# Patient Record
Sex: Male | Born: 1935 | Race: Black or African American | Hispanic: No | Marital: Married | Smoking: Former smoker
Health system: Southern US, Community
[De-identification: ages and names within clinical notes are randomized; demographics above are authoritative.]

## PROBLEM LIST (undated history)

## (undated) DIAGNOSIS — M1990 Unspecified osteoarthritis, unspecified site: Secondary | ICD-10-CM

## (undated) DIAGNOSIS — N400 Enlarged prostate without lower urinary tract symptoms: Secondary | ICD-10-CM

## (undated) DIAGNOSIS — E785 Hyperlipidemia, unspecified: Secondary | ICD-10-CM

## (undated) DIAGNOSIS — H547 Unspecified visual loss: Secondary | ICD-10-CM

## (undated) DIAGNOSIS — I10 Essential (primary) hypertension: Secondary | ICD-10-CM

## (undated) DIAGNOSIS — H409 Unspecified glaucoma: Secondary | ICD-10-CM

## (undated) DIAGNOSIS — E1139 Type 2 diabetes mellitus with other diabetic ophthalmic complication: Secondary | ICD-10-CM

## (undated) DIAGNOSIS — K219 Gastro-esophageal reflux disease without esophagitis: Secondary | ICD-10-CM

## (undated) HISTORY — DX: Hyperlipidemia, unspecified: E78.5

## (undated) HISTORY — DX: Essential (primary) hypertension: I10

## (undated) HISTORY — DX: Gastro-esophageal reflux disease without esophagitis: K21.9

## (undated) HISTORY — DX: Type 2 diabetes mellitus with other diabetic ophthalmic complication: H54.7

## (undated) HISTORY — DX: Benign prostatic hyperplasia without lower urinary tract symptoms: N40.0

## (undated) HISTORY — DX: Unspecified osteoarthritis, unspecified site: M19.90

## (undated) HISTORY — DX: Unspecified glaucoma: H40.9

## (undated) HISTORY — PX: HERNIA REPAIR: SHX51

## (undated) HISTORY — DX: Type 2 diabetes mellitus with other diabetic ophthalmic complication: E11.39

---

## 2017-07-11 ENCOUNTER — Ambulatory Visit (INDEPENDENT_AMBULATORY_CARE_PROVIDER_SITE_OTHER): Payer: Medicare HMO | Admitting: Family Medicine

## 2017-07-11 ENCOUNTER — Encounter: Payer: Self-pay | Admitting: Family Medicine

## 2017-07-11 VITALS — BP 120/70 | HR 108 | Temp 97.7°F | Wt 214.3 lb

## 2017-07-11 DIAGNOSIS — E119 Type 2 diabetes mellitus without complications: Secondary | ICD-10-CM

## 2017-07-11 DIAGNOSIS — I10 Essential (primary) hypertension: Secondary | ICD-10-CM

## 2017-07-11 DIAGNOSIS — H6123 Impacted cerumen, bilateral: Secondary | ICD-10-CM | POA: Diagnosis not present

## 2017-07-11 DIAGNOSIS — Z7689 Persons encountering health services in other specified circumstances: Secondary | ICD-10-CM

## 2017-07-11 DIAGNOSIS — H409 Unspecified glaucoma: Secondary | ICD-10-CM

## 2017-07-11 LAB — POCT GLYCOSYLATED HEMOGLOBIN (HGB A1C): Hemoglobin A1C: 6.5

## 2017-07-11 LAB — GLUCOSE, POCT (MANUAL RESULT ENTRY): POC Glucose: 164 mg/dl — AB (ref 70–99)

## 2017-07-11 NOTE — Progress Notes (Signed)
Patient presents to clinic today to establish care.  SUBJECTIVE: PMH: Pt is a 82 yo male with pmh sig for legally blind, GERD, HLD, HTN, diabetes, BPH, arthritis.  Pt was previously seen in [place], [place].  GERD: -taking pantoprazole prn  DM II: -dx'd in 2002 -taking Metformin  -does not check FSBS at home.  H/o glaucoma: -pt states he does use any drops -Patient states he recently had his vision checked prior to leaving [place] -Patient states he does not due for another ophthalmology appointment until ~6 months  BPH: -Flomax 0.4 mg  Allergies: NKDA  Past surgical history: Hernia repair  Social history: Patient is married.  He is retired.  Patient has 2 children.  Patient was living in [place] with 1 but moved to the [place] area to live with his daughter.  Patient denies tobacco, alcohol, drug use.  Patient is a former smoker.  He quit 40 years ago.   Past Medical History:  Diagnosis Date  . Arthritis   . Blindness due to type 2 diabetes mellitus (HCC)   . Glaucoma     Past Surgical History:  Procedure Laterality Date  . HERNIA REPAIR      Current Outpatient Medications on File Prior to Visit  Medication Sig Dispense Refill  . acetaminophen (TYLENOL) 500 MG tablet Take 500 mg by mouth every 8 (eight) hours as needed.    . Ascorbic Acid (VITAMIN C WITH ROSE HIPS) 500 MG tablet Take 500 mg by mouth daily.    Marland Kitchen. aspirin EC 81 MG tablet Take 81 mg by mouth daily.    Marland Kitchen. atorvastatin (LIPITOR) 40 MG tablet Take 40 mg by mouth daily.    . metFORMIN (GLUCOPHAGE) 500 MG tablet Take 500 mg by mouth daily.    . metoprolol tartrate (LOPRESSOR) 25 MG tablet Take 25 mg by mouth 2 (two) times daily.    . Multiple Vitamins-Minerals (CENTRUM SILVER ADULT 50+ PO) Take 1 tablet by mouth daily.    . pantoprazole (PROTONIX) 40 MG tablet Take 40 mg by mouth daily.    . tamsulosin (FLOMAX) 0.4 MG CAPS capsule Take 0.4 mg by mouth.     No current facility-administered  medications on file prior to visit.     Not on File  History reviewed. No pertinent family history.  Social History   Socioeconomic History  . Marital status: Unknown    Spouse name: Not on file  . Number of children: Not on file  . Years of education: Not on file  . Highest education level: Not on file  Occupational History  . Not on file  Social Needs  . Financial resource strain: Not on file  . Food insecurity:    Worry: Not on file    Inability: Not on file  . Transportation needs:    Medical: Not on file    Non-medical: Not on file  Tobacco Use  . Smoking status: Not on file  Substance and Sexual Activity  . Alcohol use: Not on file  . Drug use: Not on file  . Sexual activity: Not on file  Lifestyle  . Physical activity:    Days per week: Not on file    Minutes per session: Not on file  . Stress: Not on file  Relationships  . Social connections:    Talks on phone: Not on file    Gets together: Not on file    Attends religious service: Not on file    Active member  of club or organization: Not on file    Attends meetings of clubs or organizations: Not on file    Relationship status: Not on file  . Intimate partner violence:    Fear of current or ex partner: Not on file    Emotionally abused: Not on file    Physically abused: Not on file    Forced sexual activity: Not on file  Other Topics Concern  . Not on file  Social History Narrative  . Not on file    ROS General: Denies fever, chills, night sweats, changes in weight, changes in appetite HEENT: Denies headaches, ear pain, changes in vision, rhinorrhea, sore throat  + legally blind CV: Denies CP, palpitations, SOB, orthopnea Pulm: Denies SOB, cough, wheezing GI: Denies abdominal pain, nausea, vomiting, diarrhea, constipation GU: Denies dysuria, hematuria, frequency, vaginal discharge Msk: Denies muscle cramps, joint pains Neuro: Denies weakness, numbness, tingling Skin: Denies rashes,  bruising Psych: Denies depression, anxiety, hallucinations  BP 120/70 (BP Location: Left Arm, Patient Position: Sitting, Cuff Size: Normal)   Pulse (!) 108   Temp 97.7 F (36.5 C) (Oral)   Wt 214 lb 4.8 oz (97.2 kg)   SpO2 97%   Physical Exam Gen. Pleasant, well developed, well-nourished, in NAD HEENT -legally blind. [place]/AT, PERRL, no scleral icterus, no nasal drainage, pharynx without erythema or exudate.  TMs normal bilaterally after impacted cerumen removed with irrigation. Lungs: no use of accessory muscles, CTAB, no wheezes, rales or rhonchi Cardiovascular: RRR,  No r/g/m, no peripheral edema Abdomen: BS present, soft, nontender, nondistended Neuro:  A&Ox3, CN II-XII intact, uses rollator to assist with ambulation Skin:  Warm, dry, intact, no lesions Psych: normal affect, mood appropriate  No results found for this or any previous visit (from the past 2160 hour(s)).  Assessment/Plan: Essential hypertension -Controlled -Continue lisinopril 20 mg and metoprolol 25 mg twice daily  Diabetes mellitus without complication (HCC) -Hemoglobin A1c 6.5% this visit -FSBS 164 -Continue metformin 500 mg daily - Plan: Ambulatory referral to Podiatry, Ambulatory referral to Ophthalmology, POCT glycosylated hemoglobin (Hb A1C), POCT glucose (manual entry)  Glaucoma of both eyes, unspecified glaucoma type - Plan: Ambulatory referral to Ophthalmology  Bilateral impacted cerumen -Consent obtained.  Bilateral ears irrigated.  Patient tolerated procedure well.  Encounter to establish care -We reviewed the PMH, PSH, FH, SH, Meds and Allergies. -We provided refills for any medications we will prescribe as needed. -We addressed current concerns per orders and patient instructions. -We have asked for records for pertinent exams, studies, vaccines and notes from previous providers. -We have advised patient to follow up per instructions below.   Follow-up in the next 2 to 3 months.  Abbe Amsterdam, MD

## 2017-07-11 NOTE — Patient Instructions (Signed)
Earwax Buildup, Adult The ears produce a substance called earwax that helps keep bacteria out of the ear and protects the skin in the ear canal. Occasionally, earwax can build up in the ear and cause discomfort or hearing loss. What increases the risk? This condition is more likely to develop in people who:  Are male.  Are elderly.  Naturally produce more earwax.  Clean their ears often with cotton swabs.  Use earplugs often.  Use in-ear headphones often.  Wear hearing aids.  Have narrow ear canals.  Have earwax that is overly thick or sticky.  Have eczema.  Are dehydrated.  Have excess hair in the ear canal.  What are the signs or symptoms? Symptoms of this condition include:  Reduced or muffled hearing.  A feeling of fullness in the ear or feeling that the ear is plugged.  Fluid coming from the ear.  Ear pain.  Ear itch.  Ringing in the ear.  Coughing.  An obvious piece of earwax that can be seen inside the ear canal.  How is this diagnosed? This condition may be diagnosed based on:  Your symptoms.  Your medical history.  An ear exam. During the exam, your health care provider will look into your ear with an instrument called an otoscope.  You may have tests, including a hearing test. How is this treated? This condition may be treated by:  Using ear drops to soften the earwax.  Having the earwax removed by a health care provider. The health care provider may: ? Flush the ear with water. ? Use an instrument that has a loop on the end (curette). ? Use a suction device.  Surgery to remove the wax buildup. This may be done in severe cases.  Follow these instructions at home:  Take over-the-counter and prescription medicines only as told by your health care provider.  Do not put any objects, including cotton swabs, into your ear. You can clean the opening of your ear canal with a washcloth or facial tissue.  Follow instructions from your health  care provider about cleaning your ears. Do not over-clean your ears.  Drink enough fluid to keep your urine clear or pale yellow. This will help to thin the earwax.  Keep all follow-up visits as told by your health care provider. If earwax builds up in your ears often or if you use hearing aids, consider seeing your health care provider for routine, preventive ear cleanings. Ask your health care provider how often you should schedule your cleanings.  If you have hearing aids, clean them according to instructions from the manufacturer and your health care provider. Contact a health care provider if:  You have ear pain.  You develop a fever.  You have blood, pus, or other fluid coming from your ear.  You have hearing loss.  You have ringing in your ears that does not go away.  Your symptoms do not improve with treatment.  You feel like the room is spinning (vertigo). Summary  Earwax can build up in the ear and cause discomfort or hearing loss.  The most common symptoms of this condition include reduced or muffled hearing and a feeling of fullness in the ear or feeling that the ear is plugged.  This condition may be diagnosed based on your symptoms, your medical history, and an ear exam.  This condition may be treated by using ear drops to soften the earwax or by having the earwax removed by a health care provider.  Do   not put any objects, including cotton swabs, into your ear. You can clean the opening of your ear canal with a washcloth or facial tissue. This information is not intended to replace advice given to you by your health care provider. Make sure you discuss any questions you have with your health care provider. Document Released: 04/12/2004 Document Revised: 05/16/2016 Document Reviewed: 05/16/2016 Elsevier Interactive Patient Education  2018 [employer]  

## 2017-07-16 ENCOUNTER — Telehealth: Payer: Self-pay | Admitting: Family Medicine

## 2017-07-16 ENCOUNTER — Other Ambulatory Visit: Payer: Self-pay | Admitting: Family Medicine

## 2017-07-16 MED ORDER — TAMSULOSIN HCL 0.4 MG PO CAPS: 0.4000 mg | ORAL_CAPSULE | Freq: Two times a day (BID) | ORAL | 3 refills | Status: DC

## 2017-07-16 NOTE — Telephone Encounter (Signed)
Pt is requesting a refill on medication: - Lisinopril   This medication is not listed in pts chart. Please advise.

## 2017-07-16 NOTE — Telephone Encounter (Signed)
Please call pt's or his wife as pt is leagally blind if I remember correctly, to verify the dose of lisinopril and the flomax.  I do recall pt being on flomax, but not twice a day.  I am ok with refilling these meds after we get clarification.

## 2017-07-16 NOTE — Telephone Encounter (Signed)
Copied from CRM [id number]. Topic: Quick Communication - Rx Refill/Question >> Jul 16, 2017 11:10 AM Maia Petties wrote: Medication: tamsulosin 2/day, pt is out - lisinopril 1/day, pt has 2 left Has the patient contacted their pharmacy? No - new pt with Dr. Salomon Fick. Preferred Pharmacy (with phone number or street name): [id number] [place], [place] - [address] [phone] (Phone) [phone] (Fax)

## 2017-07-18 NOTE — Telephone Encounter (Signed)
Flomax was filled to pharmacy on 07/16/2017.  Called patient's daughter (on [place]) to verify dosing. She is at work and asked that I call patient/his wife at home.  Called patient and left message to return call.

## 2017-07-19 NOTE — Telephone Encounter (Signed)
Spoke w/ patient. He no longer has the bottle for his lisinopril. It was last filled by [facility] in [place].  Attempted to call them to verify dose at [phone], but there was no answer.  Will attempt call back.

## 2017-07-24 NOTE — Telephone Encounter (Signed)
Copied from CRM [id number]. Topic: Quick Communication - See Telephone Encounter >> Jul 24, 2017 11:10 AM Jolayne Haines L wrote: Patient called back and stated that the dosage is , one tablet by mouth daily.

## 2017-07-25 ENCOUNTER — Other Ambulatory Visit: Payer: Self-pay

## 2017-07-25 ENCOUNTER — Telehealth: Payer: Self-pay

## 2017-07-25 MED ORDER — LISINOPRIL 20 MG PO TABS: 20.0000 mg | ORAL_TABLET | Freq: Every day | ORAL | 1 refills | Status: DC

## 2017-07-25 NOTE — Telephone Encounter (Signed)
Rx for Lisinopril 20 mg was sent to pt pharmacy [facility]

## 2017-07-26 ENCOUNTER — Encounter (INDEPENDENT_AMBULATORY_CARE_PROVIDER_SITE_OTHER): Payer: Self-pay | Admitting: Ophthalmology

## 2017-07-31 ENCOUNTER — Encounter (INDEPENDENT_AMBULATORY_CARE_PROVIDER_SITE_OTHER): Payer: Self-pay | Admitting: Ophthalmology

## 2017-08-07 ENCOUNTER — Telehealth: Payer: Self-pay | Admitting: Family Medicine

## 2017-08-07 NOTE — Telephone Encounter (Signed)
Ok to refill 

## 2017-08-07 NOTE — Telephone Encounter (Signed)
Refill for Lipitor 40 mg prescribed by historical provider  Dr. Kathie Rhodes. [place] LOV  07/14/17 NOV  09/13/17  [facility] [address] [place]

## 2017-08-07 NOTE — Telephone Encounter (Unsigned)
Copied from CRM [id number]. Topic: Quick Communication - Rx Refill/Question >> Aug 07, 2017 12:16 PM Floria Raveling A wrote: Medication: atorvastatin (LIPITOR) 40 MG tablet [[id number]]   Has the patient contacted their pharmacy? No  (Agent: If no, request that the patient contact the pharmacy for the refill.) (Agent: If yes, when and what did the pharmacy advise?)  Preferred Pharm[facility] (with phone number [facility] [address]   Agent: Please be advised that RX refills may take up to 3 business days. We ask that you follow-up with your pharmacy.

## 2017-08-08 MED ORDER — ATORVASTATIN CALCIUM 40 MG PO TABS: 40.0000 mg | ORAL_TABLET | Freq: Every day | ORAL | 0 refills | Status: DC

## 2017-08-08 NOTE — Telephone Encounter (Signed)
I sent script e-scribe for a 90 day supply [facility] [address].

## 2017-08-09 ENCOUNTER — Other Ambulatory Visit: Payer: Self-pay | Admitting: Family Medicine

## 2017-08-09 MED ORDER — ASPIRIN EC 81 MG PO TBEC: 81.0000 mg | DELAYED_RELEASE_TABLET | Freq: Every day | ORAL | 3 refills | Status: DC

## 2017-08-09 NOTE — Telephone Encounter (Signed)
ASA is OTC.

## 2017-08-09 NOTE — Telephone Encounter (Signed)
Copied from CRM [id number]. Topic: Quick Communication - See Telephone Encounter >> Aug 09, 2017  9:53 AM Waymon Amato wrote: Pt is needing a refill on  his aspirin   [facility] [address] [phone]  Best number [phone]

## 2017-08-09 NOTE — Telephone Encounter (Signed)
Pt. requesting refill on ASA 81 mg ; historical provider only on this medication. Last office visit with Dr. Salomon Fick 07/11/17 to establish care [facility] [address]

## 2017-08-09 NOTE — Telephone Encounter (Signed)
Please advise 

## 2017-09-13 ENCOUNTER — Encounter (INDEPENDENT_AMBULATORY_CARE_PROVIDER_SITE_OTHER): Payer: Self-pay | Admitting: Ophthalmology

## 2017-09-13 ENCOUNTER — Ambulatory Visit: Payer: Medicare HMO | Admitting: Podiatry

## 2017-09-13 ENCOUNTER — Ambulatory Visit: Payer: Medicare HMO | Admitting: Family Medicine

## 2017-10-02 DIAGNOSIS — H409 Unspecified glaucoma: Secondary | ICD-10-CM | POA: Diagnosis not present

## 2017-10-02 DIAGNOSIS — I10 Essential (primary) hypertension: Secondary | ICD-10-CM | POA: Diagnosis not present

## 2017-10-02 DIAGNOSIS — M170 Bilateral primary osteoarthritis of knee: Secondary | ICD-10-CM | POA: Diagnosis not present

## 2017-10-02 DIAGNOSIS — E1140 Type 2 diabetes mellitus with diabetic neuropathy, unspecified: Secondary | ICD-10-CM | POA: Diagnosis not present

## 2017-10-02 DIAGNOSIS — E782 Mixed hyperlipidemia: Secondary | ICD-10-CM | POA: Diagnosis not present

## 2017-10-02 DIAGNOSIS — Z7984 Long term (current) use of oral hypoglycemic drugs: Secondary | ICD-10-CM | POA: Diagnosis not present

## 2017-10-02 DIAGNOSIS — K219 Gastro-esophageal reflux disease without esophagitis: Secondary | ICD-10-CM | POA: Diagnosis not present

## 2017-10-02 DIAGNOSIS — N400 Enlarged prostate without lower urinary tract symptoms: Secondary | ICD-10-CM | POA: Diagnosis not present

## 2017-10-15 ENCOUNTER — Other Ambulatory Visit: Payer: Self-pay | Admitting: Family Medicine

## 2017-10-15 DIAGNOSIS — E113512 Type 2 diabetes mellitus with proliferative diabetic retinopathy with macular edema, left eye: Secondary | ICD-10-CM | POA: Diagnosis not present

## 2017-10-15 DIAGNOSIS — H401123 Primary open-angle glaucoma, left eye, severe stage: Secondary | ICD-10-CM | POA: Diagnosis not present

## 2017-10-15 DIAGNOSIS — H04123 Dry eye syndrome of bilateral lacrimal glands: Secondary | ICD-10-CM | POA: Diagnosis not present

## 2017-10-15 DIAGNOSIS — H548 Legal blindness, as defined in USA: Secondary | ICD-10-CM | POA: Diagnosis not present

## 2017-10-22 DIAGNOSIS — H401123 Primary open-angle glaucoma, left eye, severe stage: Secondary | ICD-10-CM | POA: Diagnosis not present

## 2017-10-22 DIAGNOSIS — H348322 Tributary (branch) retinal vein occlusion, left eye, stable: Secondary | ICD-10-CM | POA: Diagnosis not present

## 2017-10-22 DIAGNOSIS — H348312 Tributary (branch) retinal vein occlusion, right eye, stable: Secondary | ICD-10-CM | POA: Diagnosis not present

## 2017-10-22 DIAGNOSIS — H401133 Primary open-angle glaucoma, bilateral, severe stage: Secondary | ICD-10-CM | POA: Diagnosis not present

## 2017-10-28 DIAGNOSIS — H409 Unspecified glaucoma: Secondary | ICD-10-CM | POA: Diagnosis not present

## 2017-10-28 DIAGNOSIS — Z01818 Encounter for other preprocedural examination: Secondary | ICD-10-CM | POA: Diagnosis not present

## 2017-10-28 DIAGNOSIS — H401113 Primary open-angle glaucoma, right eye, severe stage: Secondary | ICD-10-CM | POA: Diagnosis not present

## 2018-01-09 ENCOUNTER — Other Ambulatory Visit: Payer: Self-pay

## 2018-01-09 ENCOUNTER — Encounter: Payer: Self-pay | Admitting: Podiatry

## 2018-01-09 ENCOUNTER — Ambulatory Visit: Payer: Medicare HMO | Admitting: Podiatry

## 2018-01-09 VITALS — BP 139/66

## 2018-01-09 DIAGNOSIS — M79674 Pain in right toe(s): Secondary | ICD-10-CM

## 2018-01-09 DIAGNOSIS — B351 Tinea unguium: Secondary | ICD-10-CM

## 2018-01-09 DIAGNOSIS — L600 Ingrowing nail: Secondary | ICD-10-CM

## 2018-01-09 DIAGNOSIS — M79675 Pain in left toe(s): Secondary | ICD-10-CM | POA: Diagnosis not present

## 2018-01-09 DIAGNOSIS — E1142 Type 2 diabetes mellitus with diabetic polyneuropathy: Secondary | ICD-10-CM | POA: Diagnosis not present

## 2018-01-09 NOTE — Patient Instructions (Addendum)
APPLY TRIPLE ANTIBIOTIC OINTMENT TO LEFT GREAT TOE ONCE DAILY FOR  ONE WEEK      Diabetic Neuropathy Diabetic neuropathy is a nerve disease or nerve damage that is caused by diabetes mellitus. About half of all people with diabetes mellitus have some form of nerve damage. Nerve damage is more common in those who have had diabetes mellitus for many years and who generally have not had good control of their blood sugar (glucose) level. Diabetic neuropathy is a common complication of diabetes mellitus. There are three common types of diabetic neuropathy and a fourth type that is less common and less understood:  Peripheral neuropathy-This is the most common type of diabetic neuropathy. It causes damage to the nerves of the feet and legs first and then eventually the hands and arms. The damage affects the ability to sense touch.  Autonomic neuropathy-This type causes damage to the autonomic nervous system, which controls the following functions: ? Heartbeat. ? Body temperature. ? Blood pressure. ? Urination. ? Digestion. ? Sweating. ? Sexual function.  Focal neuropathy-Focal neuropathy can be painful and unpredictable and occurs most often in older adults with diabetes mellitus. It involves a specific nerve or one area and often comes on suddenly. It usually does not cause long-term problems.  Radiculoplexus neuropathy- Sometimes called lumbosacral radiculoplexus neuropathy, radiculoplexus neuropathy affects the nerves of the thighs, hips, buttocks, or legs. It is more common in people with type 2 diabetes mellitus and in older men. It is characterized by debilitating pain, weakness, and atrophy, usually in the thigh muscles.  What are the causes? The cause of peripheral, autonomic, and focal neuropathies is diabetes mellitus that is uncontrolled and high glucose levels. The cause of radiculoplexus neuropathy is unknown. However, it is thought to be caused by inflammation related to uncontrolled  glucose levels. What are the signs or symptoms? Peripheral Neuropathy Peripheral neuropathy develops slowly over time. When the nerves of the feet and legs no longer work there may be:  Burning, stabbing, or aching pain in the legs or feet.  Inability to feel pressure or pain in your feet. This can lead to: ? Thick calluses over pressure areas. ? Pressure sores. ? Ulcers.  Foot deformities.  Reduced ability to feel temperature changes.  Muscle weakness.  Autonomic Neuropathy The symptoms of autonomic neuropathy vary depending on which nerves are affected. Symptoms may include:  Problems with digestion, such as: ? Feeling sick to your stomach (nausea). ? Vomiting. ? Bloating. ? Constipation. ? Diarrhea. ? Abdominal pain.  Difficulty with urination. This occurs if you lose your ability to sense when your bladder is full. Problems include: ? Urine leakage (incontinence). ? Inability to empty your bladder completely (retention).  Rapid or irregular heartbeat (palpitations).  Blood pressure drops when you stand up (orthostatic hypotension). When you stand up you may feel: ? Dizzy. ? Weak. ? Faint.  In men, inability to attain and maintain an erection.  In women, vaginal dryness and problems with decreased sexual desire and arousal.  Problems with body temperature regulation.  Increased or decreased sweating.  Focal Neuropathy  Abnormal eye movements or abnormal alignment of both eyes.  Weakness in the wrist.  Foot drop. This results in an inability to lift the foot properly and abnormal walking or foot movement.  Paralysis on one side of your face (Bell palsy).  Chest or abdominal pain. Radiculoplexus Neuropathy  Sudden, severe pain in your hip, thigh, or buttocks.  Weakness and wasting of thigh muscles.  Difficulty rising from  a seated position.  Abdominal swelling.  Unexplained weight loss (usually more than 10 lb [4.5 kg]). How is this  diagnosed? Peripheral Neuropathy Your senses may be tested. Sensory function testing can be done with:  A light touch using a monofilament.  A vibration with tuning fork.  A sharp sensation with a pin prick.  Other tests that can help diagnose neuropathy are:  Nerve conduction velocity. This test checks the transmission of an electrical current through a nerve.  Electromyography. This shows how muscles respond to electrical signals transmitted by nearby nerves.  Quantitative sensory testing. This is used to assess how your nerves respond to vibrations and changes in temperature.  Autonomic Neuropathy Diagnosis is often based on reported symptoms. Tell your health care provider if you experience:  Dizziness.  Constipation.  Diarrhea.  Inappropriate urination or inability to urinate.  Inability to get or maintain an erection.  Tests that may be done include:  Electrocardiography or Holter monitor. These are tests that can help show problems with the heart rate or heart rhythm.  An X-ray exam may be done.  Focal Neuropathy Diagnosis is made based on your symptoms and what your health care provider finds during your exam. Other tests may be done. They may include:  Nerve conduction velocities. This checks the transmission of electrical current through a nerve.  Electromyography. This shows how muscles respond to electrical signals transmitted by nearby nerves.  Quantitative sensory testing. This test is used to assess how your nerves respond to vibration and changes in temperature.  Radiculoplexus Neuropathy  Often the first thing is to eliminate any other issue or problems that might be the cause, as there is no standard test for diagnosis.  X-ray exam of your spine and lumbar region.  Spinal tap to rule out cancer.  MRI to rule out other lesions. How is this treated? Once nerve damage occurs, it cannot be reversed. The goal of treatment is to keep the disease or  nerve damage from getting worse and affecting more nerve fibers. Controlling your blood glucose level is the key. Most people with radiculoplexus neuropathy see at least a partial improvement over time. You will need to keep your blood glucose and HbA1c levels in the target range determined by your health care provider. Things that help control blood glucose levels include:  Blood glucose monitoring.  Meal planning.  Physical activity.  Diabetes medicine.  Over time, maintaining lower blood glucose levels helps lessen symptoms. Sometimes, prescription pain medicine is needed. Follow these instructions at home:  Do not smoke.  Keep your blood glucose level in the range that you and your health care provider have determined acceptable for you.  Keep your blood pressure level in the range that you and your health care provider have determined acceptable for you.  Eat a well-balanced diet.  Be physically active every day. Include strength training and balance exercises.  Protect your feet. ? Check your feet every day for sores, cuts, blisters, or signs of infection. ? Wear padded socks and supportive shoes. Use orthotic inserts, if necessary. ? Regularly check the insides of your shoes for worn spots. Make sure there are no rocks or other items inside your shoes before you put them on. Contact a health care provider if:  You have burning, stabbing, or aching pain in the legs or feet.  You are unable to feel pressure or pain in your feet.  You develop problems with digestion such as: ? Nausea. ? Vomiting. ? Bloating. ?  Constipation. ? Diarrhea. ? Abdominal pain.  You have difficulty with urination, such as: ? Incontinence. ? Retention.  You have palpitations.  You develop orthostatic hypotension. When you stand up you may feel: ? Dizzy. ? Weak. ? Faint.  You cannot attain and maintain an erection (in men).  You have vaginal dryness and problems with decreased sexual  desire and arousal (in women).  You have severe pain in your thighs, legs, or buttocks.  You have unexplained weight loss. This information is not intended to replace advice given to you by your health care provider. Make sure you discuss any questions you have with your health care provider. Document Released: 05/14/2001 Document Revised: 08/11/2015 Document Reviewed: 08/14/2012 Elsevier Interactive Patient Education  2017 [employer] Diabetes and Foot Care Diabetes may cause you to have problems because of poor blood supply (circulation) to your feet and legs. This may cause the skin on your feet to become thinner, break easier, and heal more slowly. Your skin may become dry, and the skin may peel and crack. You may also have nerve damage in your legs and feet causing decreased feeling in them. You may not notice minor injuries to your feet that could lead to infections or more serious problems. Taking care of your feet is one of the most important things you can do for yourself. Follow these instructions at home:  Wear shoes at all times, even in the house. Do not go barefoot. Bare feet are easily injured.  Check your feet daily for blisters, cuts, and redness. If you cannot see the bottom of your feet, use a mirror or ask someone for help.  Wash your feet with warm water (do not use hot water) and mild soap. Then pat your feet and the areas between your toes until they are completely dry. Do not soak your feet as this can dry your skin.  Apply a moisturizing lotion or petroleum jelly (that does not contain alcohol and is unscented) to the skin on your feet and to dry, brittle toenails. Do not apply lotion between your toes.  Trim your toenails straight across. Do not dig under them or around the cuticle. File the edges of your nails with an emery board or nail file.  Do not cut corns or calluses or try to remove them with medicine.  Wear clean socks or stockings every day. Make sure  they are not too tight. Do not wear knee-high stockings since they may decrease blood flow to your legs.  Wear shoes that fit properly and have enough cushioning. To break in new shoes, wear them for just a few hours a day. This prevents you from injuring your feet. Always look in your shoes before you put them on to be sure there are no objects inside.  Do not cross your legs. This may decrease the blood flow to your feet.  If you find a minor scrape, cut, or break in the skin on your feet, keep it and the skin around it clean and dry. These areas may be cleansed with mild soap and water. Do not cleanse the area with peroxide, alcohol, or iodine.  When you remove an adhesive bandage, be sure not to damage the skin around it.  If you have a wound, look at it several times a day to make sure it is healing.  Do not use heating pads or hot water bottles. They may burn your skin. If you have lost feeling in your feet or legs, you  may not know it is happening until it is too late.  Make sure your health care provider performs a complete foot exam at least annually or more often if you have foot problems. Report any cuts, sores, or bruises to your health care provider immediately. Contact a health care provider if:  You have an injury that is not healing.  You have cuts or breaks in the skin.  You have an ingrown nail.  You notice redness on your legs or feet.  You feel burning or tingling in your legs or feet.  You have pain or cramps in your legs and feet.  Your legs or feet are numb.  Your feet always feel cold. Get help right away if:  There is increasing redness, swelling, or pain in or around a wound.  There is a red line that goes up your leg.  Pus is coming from a wound.  You develop a fever or as directed by your health care provider.  You notice a bad smell coming from an ulcer or wound. This information is not intended to replace advice given to you by your health care  provider. Make sure you discuss any questions you have with your health care provider. Document Released: 03/02/2000 Document Revised: 08/11/2015 Document Reviewed: 08/12/2012 Elsevier Interactive Patient Education  2017 [employer].

## 2018-01-16 DIAGNOSIS — Z0000 Encounter for general adult medical examination without abnormal findings: Secondary | ICD-10-CM | POA: Diagnosis not present

## 2018-01-16 DIAGNOSIS — Z23 Encounter for immunization: Secondary | ICD-10-CM | POA: Diagnosis not present

## 2018-01-31 ENCOUNTER — Encounter: Payer: Self-pay | Admitting: Podiatry

## 2018-01-31 NOTE — Progress Notes (Signed)
Subjective: Michael Jensen presents to clinic today with h/o diabetes and cc of painful, discolored, thick toenails which interfere with daily activities.  Most painful is his left great toe which he attributes to an ingrown toenail. Wife relates no drainage, no redness and no swelling. Pain is aggravated when wearing enclosed shoe gear.   [place] to be closer to their daughter. He is here to establish routine care. He is blind.  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)  Date Unknown BPH (benign prostatic hyperplasia)  Date Unknown GERD (gastroesophageal reflux disease)  Date Unknown Glaucoma  Date Unknown HTN (hypertension)  Date Unknown Hyperlipidemia  Date Unknown OA (osteoarthritis)   Surgical History   Date Unknown Hernia repair   Medications    acetaminophen (TYLENOL) 500 MG tablet    Ascorbic Acid (VITAMIN C WITH ROSE HIPS) 500 MG tablet    aspirin EC 81 MG tablet    atorvastatin (LIPITOR) 40 MG tablet    brimonidine (ALPHAGAN) 0.2 % ophthalmic solution    dorzolamide (TRUSOPT) 2 % ophthalmic solution    latanoprost (XALATAN) 0.005 % ophthalmic solution    lisinopril (PRINIVIL,ZESTRIL) 20 MG tablet    metFORMIN (GLUCOPHAGE) 500 MG tablet    metoprolol tartrate (LOPRESSOR) 25 MG tablet    Multiple Vitamins-Minerals (CENTRUM SILVER ADULT 50+ PO)    pantoprazole (PROTONIX) 40 MG tablet    tamsulosin (FLOMAX) 0.4 MG CAPS capsule    Allergies      No Known Allergies  Mark as: Review Complete   Tobacco History   Smoking Status  Former Smoker  Smokeless Tobacco Status  Never Used   Family History   None   Review of systems: Constitutional: Denies chills fatigue fever sweats weight change Eyes: +Blindness, Denies diplopia glare light sensitivity Ears nose mouth throat: Denies vertigo denies bloody nose rhinitis denies cold sores and snoring Cardiovascular:  Denies chest pain tightness Respiratory: Denies difficulty breathing, denies congestion Gastrointestinal: Denies abdominal pain, diarrhea, nausea, vomiting Genitourinary: Denies nocturia, pain on urination, blood in urine, +prostate problems Musculoskeletal: Denies cramping Skin: +changes in toenails, denies color change dryness, itchy skin, mole changes, or rash  Neurological: Denies fainting, denies seizure, denies change in speech.  Positive for headaches periodically Endocrine: Denies dry mouth, denies flushing, denies heat intolerance, denies cold intolerance, denies excessive thirst, denies polyuria, denies nocturia Hematological: Denies easy bleeding, excessive bleeding, easy bruising, denies enlarged lymph nodes Allergy/immunological: Denies hives denies frequent infections  Objective: Vascular Examination: Capillary refill time <3 seconds x 10 digits Dorsalis pedis palpable b/l Posterior tibial pulses faintly palpable b/l No digital hair x 10 digits Skin temperature gradient is WNL b/l Trace edema b/l LE  Dermatological Examination: Pedal skin with normal turgor, texture and tone b/l Toenails 1-5 b/l discolored, thick, dystrophic with subungual debris and pain with palpation to nailbeds due to thickness of nails. Incurvated nailplate left great toe medial and lateral borders with tenderness to palpation.No edema, no erythema, no drainage.  Musculoskeletal: Muscle strength 5/5 to all LE muscle groups  Neurological: Sensation diminished with 10 gram monofilament b/l Vibratory sensation diminished b/l  Assessment: 1. Painful onychomycosis toenails 1-5 b/l  2. Ingrown toenail left great toe 3. NIDDM with peripheral neuropathy  Plan: 1. Discussed diagnoses and treatment options on today. Patient consented to and agrees with toenail debridement for relief of pain symptoms. Written literature dispensed to Mr.  Dekay on today for his wife to read to him since he is  blind. 2. Toenails 1-5 b/l were debrided in length and girth without iatrogenic bleeding. Offending nail border debrided and curretaged left great toe. Triple antibiotic ointment applied. Wife instructed to apply triple antibiotic ointment to left great toe once daily for one week. 3. Patient to continue soft, supportive shoe gear 4. Patient to report any pedal injuries to medical professional immediately. 5. Follow up 3 months. Patient/POA to call should there be a concern in the interim.

## 2018-02-02 ENCOUNTER — Other Ambulatory Visit: Payer: Self-pay | Admitting: Family Medicine

## 2018-02-18 DIAGNOSIS — H401113 Primary open-angle glaucoma, right eye, severe stage: Secondary | ICD-10-CM | POA: Diagnosis not present

## 2018-04-10 ENCOUNTER — Ambulatory Visit: Payer: Medicare HMO | Admitting: Podiatry

## 2018-04-10 DIAGNOSIS — B351 Tinea unguium: Secondary | ICD-10-CM | POA: Diagnosis not present

## 2018-04-10 DIAGNOSIS — M79674 Pain in right toe(s): Secondary | ICD-10-CM

## 2018-04-10 DIAGNOSIS — M79675 Pain in left toe(s): Secondary | ICD-10-CM | POA: Diagnosis not present

## 2018-04-10 DIAGNOSIS — E1142 Type 2 diabetes mellitus with diabetic polyneuropathy: Secondary | ICD-10-CM | POA: Diagnosis not present

## 2018-04-10 NOTE — Patient Instructions (Signed)

## 2018-04-24 ENCOUNTER — Encounter: Payer: Self-pay | Admitting: Podiatry

## 2018-04-24 NOTE — Progress Notes (Signed)
Subjective: Michael Jensen presents today with history of neuropathy with cc of painful, mycotic toenails.  Pain is aggravated when wearing enclosed shoe gear and relieved with periodic professional debridement.    Deeann SaintBanks, Shannon R, MD his PCP.   Current Outpatient Medications:  .  acetaminophen (TYLENOL) 500 MG tablet, Take 500 mg by mouth every 8 (eight) hours as needed., Disp: , Rfl:  .  Ascorbic Acid (VITAMIN C WITH ROSE HIPS) 500 MG tablet, Take 500 mg by mouth daily., Disp: , Rfl:  .  aspirin EC 81 MG tablet, Take 1 tablet (81 mg total) by mouth daily., Disp: 90 tablet, Rfl: 3 .  atorvastatin (LIPITOR) 40 MG tablet, Take 1 tablet (40 mg total) by mouth daily., Disp: 90 tablet, Rfl: 0 .  brimonidine (ALPHAGAN) 0.2 % ophthalmic solution, INSTILL ONE DROP INTO THE RIGHT EYE TWICE DAILY, Disp: , Rfl: 5 .  dorzolamide (TRUSOPT) 2 % ophthalmic solution, INSTILL 1 DROP TWICE A DAY INTO RIGHT EYE, Disp: , Rfl: 5 .  latanoprost (XALATAN) 0.005 % ophthalmic solution, , Disp: , Rfl:  .  lisinopril (PRINIVIL,ZESTRIL) 20 MG tablet, Take 1 tablet (20 mg total) by mouth daily., Disp: 90 tablet, Rfl: 1 .  metFORMIN (GLUCOPHAGE) 500 MG tablet, Take 500 mg by mouth daily., Disp: , Rfl:  .  metoprolol tartrate (LOPRESSOR) 25 MG tablet, Take 25 mg by mouth 2 (two) times daily., Disp: , Rfl:  .  Multiple Vitamins-Minerals (CENTRUM SILVER ADULT 50+ PO), Take 1 tablet by mouth daily., Disp: , Rfl:  .  pantoprazole (PROTONIX) 40 MG tablet, Take 40 mg by mouth daily., Disp: , Rfl:  .  tamsulosin (FLOMAX) 0.4 MG CAPS capsule, TAKE 1 CAPSULE BY MOUTH TWICE A DAY, Disp: 180 capsule, Rfl: 0  No Known Allergies  Objective:  Vascular Examination: Capillary refill time less than 3 seconds x 10 digits  Dorsalis pedis pulses palpable bilaterally and  Posterior tibial pulses faintly palpable bilaterally Digital hair x 10 digits was absent Skin temperature gradient WNL b/l  Dermatological Examination: Skin with  normal turgor, texture and tone b/l  Toenails 1-5 b/l discolored, thick, dystrophic with subungual debris and pain with palpation to nailbeds due to thickness of nails.  Musculoskeletal: Muscle strength 5/5 to all muscle groups b/l  Neurological: Sensation with 10 gram monofilament is absent b/l Vibratory sensation absent b/l  Assessment: 1. Painful onychomycosis toenails 1-5 b/l 2. NIDDM with neuropathy  Plan: 1. Toenails 1-5 b/l were debrided in length and girth without iatrogenic bleeding. 2. Patient to continue soft, supportive shoe gear 3. Patient to report any pedal injuries to medical professional  4. Follow up 3 months. Patient/POA to call should there be a concern in the interim.

## 2018-05-09 ENCOUNTER — Other Ambulatory Visit: Payer: Self-pay | Admitting: Family Medicine

## 2018-07-10 ENCOUNTER — Other Ambulatory Visit: Payer: Self-pay

## 2018-07-10 ENCOUNTER — Ambulatory Visit: Payer: Medicare HMO | Admitting: Podiatry

## 2018-07-10 DIAGNOSIS — M79674 Pain in right toe(s): Secondary | ICD-10-CM | POA: Diagnosis not present

## 2018-07-10 DIAGNOSIS — M79675 Pain in left toe(s): Secondary | ICD-10-CM

## 2018-07-10 DIAGNOSIS — B351 Tinea unguium: Secondary | ICD-10-CM | POA: Diagnosis not present

## 2018-07-10 DIAGNOSIS — E1142 Type 2 diabetes mellitus with diabetic polyneuropathy: Secondary | ICD-10-CM | POA: Diagnosis not present

## 2018-07-10 NOTE — Patient Instructions (Signed)
Onychomycosis/Fungal Toenails  WHAT IS IT? An infection that lies within the keratin of your nail plate that is caused by a fungus.  WHY ME? Fungal infections affect all ages, sexes, races, and creeds.  There may be many factors that predispose you to a fungal infection such as age, coexisting medical conditions such as diabetes, or an autoimmune disease; stress, medications, fatigue, genetics, etc.  Bottom line: fungus thrives in a warm, moist environment and your shoes offer such a location.  IS IT CONTAGIOUS? Theoretically, yes.  You do not want to share shoes, nail clippers or files with someone who has fungal toenails.  Walking around barefoot in the same room or sleeping in the same bed is unlikely to transfer the organism.  It is important to realize, however, that fungus can spread easily from one nail to the next on the same foot.  HOW DO WE TREAT THIS?  There are several ways to treat this condition.  Treatment may depend on many factors such as age, medications, pregnancy, liver and kidney conditions, etc.  It is best to ask your doctor which options are available to you.  1. No treatment.   Unlike many other medical concerns, you can live with this condition.  However for many people this can be a painful condition and may lead to ingrown toenails or a bacterial infection.  It is recommended that you keep the nails cut short to help reduce the amount of fungal nail. 2. Topical treatment.  These range from herbal remedies to prescription strength nail lacquers.  About 40-50% effective, topicals require twice daily application for approximately 9 to 12 months or until an entirely new nail has grown out.  The most effective topicals are medical grade medications available through physicians offices. 3. Oral antifungal medications.  With an 80-90% cure rate, the most common oral medication requires 3 to 4 months of therapy and stays in your system for a year as the new nail grows out.  Oral  antifungal medications do require blood work to make sure it is a safe drug for you.  A liver function panel will be performed prior to starting the medication and after the first month of treatment.  It is important to have the blood work performed to avoid any harmful side effects.  In general, this medication safe but blood work is required. 4. Laser Therapy.  This treatment is performed by applying a specialized laser to the affected nail plate.  This therapy is noninvasive, fast, and non-painful.  It is not covered by insurance and is therefore, out of pocket.  The results have been very good with a 80-95% cure rate.  [facility] is the only practice in the area to offer this therapy. 5. Permanent Nail Avulsion.  Removing the entire nail so that a new nail will not grow back.  Diabetes Mellitus and Foot Care Foot care is an important part of your health, especially when you have diabetes. Diabetes may cause you to have problems because of poor blood flow (circulation) to your feet and legs, which can cause your skin to:  Become thinner and drier.  Break more easily.  Heal more slowly.  Peel and crack. You may also have nerve damage (neuropathy) in your legs and feet, causing decreased feeling in them. This means that you may not notice minor injuries to your feet that could lead to more serious problems. Noticing and addressing any potential problems early is the best way to prevent   future foot problems. How to care for your feet Foot hygiene  Wash your feet daily with warm water and mild soap. Do not use hot water. Then, pat your feet and the areas between your toes until they are completely dry. Do not soak your feet as this can dry your skin.  Trim your toenails straight across. Do not dig under them or around the cuticle. File the edges of your nails with an emery board or nail file.  Apply a moisturizing lotion or petroleum jelly to the skin on your feet and to dry, brittle  toenails. Use lotion that does not contain alcohol and is unscented. Do not apply lotion between your toes. Shoes and socks  Wear clean socks or stockings every day. Make sure they are not too tight. Do not wear knee-high stockings since they may decrease blood flow to your legs.  Wear shoes that fit properly and have enough cushioning. Always look in your shoes before you put them on to be sure there are no objects inside.  To break in new shoes, wear them for just a few hours a day. This prevents injuries on your feet. Wounds, scrapes, corns, and calluses  Check your feet daily for blisters, cuts, bruises, sores, and redness. If you cannot see the bottom of your feet, use a mirror or ask someone for help.  Do not cut corns or calluses or try to remove them with medicine.  If you find a minor scrape, cut, or break in the skin on your feet, keep it and the skin around it clean and dry. You may clean these areas with mild soap and water. Do not clean the area with peroxide, alcohol, or iodine.  If you have a wound, scrape, corn, or callus on your foot, look at it several times a day to make sure it is healing and not infected. Check for: ? Redness, swelling, or pain. ? Fluid or blood. ? Warmth. ? Pus or a bad smell. General instructions  Do not cross your legs. This may decrease blood flow to your feet.  Do not use heating pads or hot water bottles on your feet. They may burn your skin. If you have lost feeling in your feet or legs, you may not know this is happening until it is too late.  Protect your feet from hot and cold by wearing shoes, such as at the beach or on hot pavement.  Schedule a complete foot exam at least once a year (annually) or more often if you have foot problems. If you have foot problems, report any cuts, sores, or bruises to your health care provider immediately. Contact a health care provider if:  You have a medical condition that increases your risk of  infection and you have any cuts, sores, or bruises on your feet.  You have an injury that is not healing.  You have redness on your legs or feet.  You feel burning or tingling in your legs or feet.  You have pain or cramps in your legs and feet.  Your legs or feet are numb.  Your feet always feel cold.  You have pain around a toenail. Get help right away if:  You have a wound, scrape, corn, or callus on your foot and: ? You have pain, swelling, or redness that gets worse. ? You have fluid or blood coming from the wound, scrape, corn, or callus. ? Your wound, scrape, corn, or callus feels warm to the touch. ? You   have pus or a bad smell coming from the wound, scrape, corn, or callus. ? You have a fever. ? You have a red line going up your leg. Summary  Check your feet every day for cuts, sores, red spots, swelling, and blisters.  Moisturize feet and legs daily.  Wear shoes that fit properly and have enough cushioning.  If you have foot problems, report any cuts, sores, or bruises to your health care provider immediately.  Schedule a complete foot exam at least once a year (annually) or more often if you have foot problems. This information is not intended to replace advice given to you by your health care provider. Make sure you discuss any questions you have with your health care provider. Document Released: 03/02/2000 Document Revised: 04/17/2017 Document Reviewed: 04/06/2016 Elsevier Interactive Patient Education  2019 [employer]  Diabetic Neuropathy Diabetic neuropathy refers to nerve damage that is caused by diabetes (diabetes mellitus). Over time, people with diabetes can develop nerve damage throughout the body. There are several types of diabetic neuropathy:  Peripheral neuropathy. This is the most common type of diabetic neuropathy. It causes damage to nerves that carry signals between the spinal cord and other parts of the body (peripheral nerves). This usually  affects nerves in the feet and legs first, and may eventually affect the hands and arms. The damage affects the ability to sense touch or temperature.  Autonomic neuropathy. This type causes damage to nerves that control involuntary functions (autonomic nerves). These nerves carry signals that control: ? Heartbeat. ? Body temperature. ? Blood pressure. ? Urination. ? Digestion. ? Sweating. ? Sexual function. ? Response to changing blood sugar (glucose) levels.  Focal neuropathy. This type of nerve damage affects one area of the body, such as an arm, a leg, or the face. The injury may involve one nerve or a small group of nerves. Focal neuropathy can be painful and unpredictable, and occurs most often in older adults with diabetes. This often develops suddenly, but usually improves over time and does not cause long-term problems.  Proximal neuropathy. This type of nerve damage affects the nerves of the thighs, hips, buttocks, or legs. It causes severe pain, weakness, and muscle death (atrophy), usually in the thigh muscles. It is more common among older men and people who have type 2 diabetes. The length of recovery time may vary. What are the causes? Peripheral, autonomic, and focal neuropathies are caused by diabetes that is not well controlled with treatment. The cause of proximal neuropathy is not known, but it may be caused by inflammation related to uncontrolled blood glucose levels. What are the signs or symptoms? Peripheral neuropathy Peripheral neuropathy develops slowly over time. When the nerves of the feet and legs no longer work, you may experience:  Burning, stabbing, or aching pain in the legs or feet.  Pain or cramping in the legs or feet.  Loss of feeling (numbness) and inability to feel pressure or pain in the feet. This can lead to: ? Thick calluses or sores on areas of constant pressure. ? Ulcers. ? Reduced ability to feel temperature changes.  Foot  deformities.  Muscle weakness.  Loss of balance or coordination. Autonomic neuropathy The symptoms of autonomic neuropathy vary depending on which nerves are affected. Symptoms may include:  Problems with digestion, such as: ? Nausea or vomiting. ? Poor appetite. ? Bloating. ? Diarrhea or constipation. ? Trouble swallowing. ? Losing weight without trying to.  Problems with the heart, blood and lungs, such as: ?   Dizziness, especially when standing up. ? Fainting. ? Shortness of breath. ? Irregular heartbeat.  Bladder problems, such as: ? Trouble starting or stopping urination. ? Leaking urine. ? Trouble emptying the bladder. ? Urinary tract infections (UTIs).  Problems with other body functions, such as: ? Sweat. You may sweat too much or too little. ? Temperature. You might get hot easily. Or, you might feel cold more than usual. ? Sexual function. Men may not be able to get or maintain an erection. Women may have vaginal dryness and difficulty with arousal. Focal neuropathy Symptoms affect only one area of the body. Common symptoms include:  Numbness.  Tingling.  Burning pain.  Prickling feeling.  Very sensitive skin.  Weakness.  Inability to move (paralysis).  Muscle twitching.  Muscles getting smaller (wasting).  Poor coordination.  Double or blurred vision. Proximal neuropathy  Sudden, severe pain in the hip, thigh, or buttocks. Pain may spread from the back into the legs (sciatica).  Pain and numbness in the arms and legs.  Tingling.  Loss of bladder control or bowel control.  Weakness and wasting of thigh muscles.  Difficulty getting up from a seated position.  Abdominal swelling.  Unexplained weight loss. How is this diagnosed? Diagnosis usually involves reviewing your medical history and any symptoms you have. Diagnosis varies depending on the type of neuropathy your health care provider suspects. Peripheral neuropathy Your health  care provider will check areas that are affected by your nervous system (neurologic exam), such as your reflexes, how you move, and what you can feel. You may have other tests, such as:  Blood tests.  Removal and examination of fluid that surrounds the spinal cord (lumbar puncture).  CT scan.  MRI.  A test to check the nerves that control muscles (electromyogram, EMG).  Tests of how quickly messages pass through your nerves (nerve conduction velocity tests).  Removal of a small piece of nerve to be examined under a microscope (biopsy). Autonomic neuropathy You may have tests, such as:  Tests to measure your blood pressure and heart rate. This may include monitoring you while you are safely secured to an exam table that moves you from a lying position to an upright position (table tilt test).  Breathing tests to check your lungs.  Tests to check how food moves through the digestive system (gastric emptying tests).  Blood, sweat, or urine tests.  Ultrasound of your bladder.  Spinal fluid tests. Focal neuropathy This condition may be diagnosed with:  A neurologic exam.  CT scan.  MRI.  EMG.  Nerve conduction velocity tests. Proximal neuropathy There is no test to diagnose this type of neuropathy. You may have tests to rule out other possible causes of this type of neuropathy. Tests may include:  X-rays of your spine and lumbar region.  Lumbar puncture.  MRI. How is this treated? The goal of treatment is to keep nerve damage from getting worse. The most important part of treatment is keeping your blood glucose level and your A1C level within your target range by following your diabetes management plan. Over time, maintaining lower blood glucose levels helps lessen symptoms. In some cases, you may need prescription pain medicine. Follow these instructions at home:  Lifestyle   Do not use any products that contain nicotine or tobacco, such as cigarettes and  e-cigarettes. If you need help quitting, ask your health care provider.  Be physically active every day. Include strength training and balance exercises.  Follow a healthy meal plan.    Work with your health care provider to manage your blood pressure. General instructions  Follow your diabetes management plan as directed. ? Check your blood glucose levels as directed by your health care provider. ? Keep your blood glucose in your target range as directed by your health care provider. ? Have your A1C level checked at least two times a year, or as often as told by your health care provider.  Take over the counter and prescription medicines only as told by your health care provider. This includes insulin and diabetes medicine.  Do not drive or use heavy machinery while taking prescription pain medicines.  Check your skin and feet every day for cuts, bruises, redness, blisters, or sores.  Keep all follow up visits as told by your health care provider. This is important. Contact a health care provider if:  You have burning, stabbing, or aching pain in your legs or feet.  You are unable to feel pressure or pain in your feet.  You develop problems with digestion, such as: ? Nausea. ? Vomiting. ? Bloating. ? Constipation. ? Diarrhea. ? Abdominal pain.  You have difficulty with urination, such as inability: ? To control when you urinate (incontinence). ? To completely empty the bladder (retention).  You have palpitations.  You feel dizzy, weak, or faint when you stand up. Get help right away if:  You cannot urinate.  You have sudden weakness or loss of coordination.  You have trouble speaking.  You have pain or pressure in your chest.  You have an irregular heart beat.  You have sudden inability to move a part of your body. Summary  Diabetic neuropathy refers to nerve damage that is caused by diabetes. It can affect nerves throughout the entire body, causing numbness  and pain in the arms, legs, digestive tract, heart, and other body systems.  Keep your blood glucose level and your blood pressure in your target range, as directed by your health care provider. This can help prevent neuropathy from getting worse.  Check your skin and feet every day for cuts, bruises, redness, blisters, or sores.  Do not use any products that contain nicotine or tobacco, such as cigarettes and e-cigarettes. If you need help quitting, ask your health care provider. This information is not intended to replace advice given to you by your health care provider. Make sure you discuss any questions you have with your health care provider. Document Released: 05/14/2001 Document Revised: 04/17/2017 Document Reviewed: 04/09/2016 Elsevier Interactive Patient Education  2019 [employer]  

## 2018-07-13 ENCOUNTER — Encounter: Payer: Self-pay | Admitting: Podiatry

## 2018-07-13 NOTE — Progress Notes (Signed)
Subjective: Michael Jensen presents today for preventative diabetic foot care. He presents with painful, thick toenails 1-5 b/l that he cannot cut and which interfere with daily activities.  Pain is aggravated when wearing enclosed shoe gear and relieved with periodic professional debridement.  Today, he notes his left great toe is most symptomatic when he wears shoes.  He denies any redness, drainage or swelling.  Deeann Saint, MD is his PCP and last visit was 07/11/2017.   Current Outpatient Medications:  .  acetaminophen (TYLENOL) 500 MG tablet, Take 500 mg by mouth every 8 (eight) hours as needed., Disp: , Rfl:  .  Ascorbic Acid (VITAMIN C WITH ROSE HIPS) 500 MG tablet, Take 500 mg by mouth daily., Disp: , Rfl:  .  aspirin EC 81 MG tablet, Take 1 tablet (81 mg total) by mouth daily., Disp: 90 tablet, Rfl: 3 .  atorvastatin (LIPITOR) 40 MG tablet, Take 1 tablet (40 mg total) by mouth daily., Disp: 90 tablet, Rfl: 0 .  brimonidine (ALPHAGAN) 0.2 % ophthalmic solution, INSTILL ONE DROP INTO THE RIGHT EYE TWICE DAILY, Disp: , Rfl: 5 .  dorzolamide (TRUSOPT) 2 % ophthalmic solution, INSTILL 1 DROP TWICE A DAY INTO RIGHT EYE, Disp: , Rfl: 5 .  latanoprost (XALATAN) 0.005 % ophthalmic solution, , Disp: , Rfl:  .  lisinopril (PRINIVIL,ZESTRIL) 20 MG tablet, Take 1 tablet (20 mg total) by mouth daily., Disp: 90 tablet, Rfl: 1 .  metFORMIN (GLUCOPHAGE) 500 MG tablet, Take 500 mg by mouth daily., Disp: , Rfl:  .  metoprolol tartrate (LOPRESSOR) 25 MG tablet, Take 25 mg by mouth 2 (two) times daily., Disp: , Rfl:  .  Multiple Vitamins-Minerals (CENTRUM SILVER ADULT 50+ PO), Take 1 tablet by mouth daily., Disp: , Rfl:  .  pantoprazole (PROTONIX) 40 MG tablet, Take 40 mg by mouth daily., Disp: , Rfl:  .  tamsulosin (FLOMAX) 0.4 MG CAPS capsule, TAKE 1 CAPSULE BY MOUTH TWICE A DAY, Disp: 180 capsule, Rfl: 0  No Known Allergies  Objective:  Vascular Examination: Capillary refill time <3 seconds  x  10 digits.  Dorsalis pedis and Posterior tibial pulses palpable b/l.  Digital hair absent x 10 digits.  Skin temperature gradient WNL b/l.  Dermatological Examination: Skin with normal turgor, texture and tone b/l.  Toenails 1-5 b/l discolored, thick, dystrophic with subungual debris and pain with palpation to nailbeds due to thickness of nails.  Incurvated nailplate left great toe medial border with tenderness to palpation. No erythema, no edema, no drainage noted.  Musculoskeletal: Muscle strength 5/5 to all LE muscle groups  No gross bony deformities b/l.  No pain, crepitus or joint limitation noted with ROM.   Neurological: Sensation absent with 10 gram monofilament.  Assessment: Painful onychomycosis toenails 1-5 b/l  Ingrown toenail left great toe, noninfected NIDDM with neuropathy  Plan: 1. Toenails 1-5 b/l were debrided in length and girth without iatrogenic bleeding.Offending nail border debrided and curretaged left great toe. Border cleansed with alcohol. Antibiotic ointment applied. Wife instructed to apply antibiotic ointment once daily for one week. 2. Patient to continue soft, supportive shoe gear daily. 3. Patient to report any pedal injuries to medical professional immediately. 4. Follow up 3 months.  5. Patient/POA to call should there be a concern in the interim.

## 2018-08-04 ENCOUNTER — Other Ambulatory Visit: Payer: Self-pay | Admitting: Family Medicine

## 2018-09-11 DIAGNOSIS — Z7984 Long term (current) use of oral hypoglycemic drugs: Secondary | ICD-10-CM | POA: Diagnosis not present

## 2018-09-11 DIAGNOSIS — I10 Essential (primary) hypertension: Secondary | ICD-10-CM | POA: Diagnosis not present

## 2018-09-11 DIAGNOSIS — E1140 Type 2 diabetes mellitus with diabetic neuropathy, unspecified: Secondary | ICD-10-CM | POA: Diagnosis not present

## 2018-09-11 DIAGNOSIS — H409 Unspecified glaucoma: Secondary | ICD-10-CM | POA: Diagnosis not present

## 2018-09-11 DIAGNOSIS — N400 Enlarged prostate without lower urinary tract symptoms: Secondary | ICD-10-CM | POA: Diagnosis not present

## 2018-09-11 DIAGNOSIS — E782 Mixed hyperlipidemia: Secondary | ICD-10-CM | POA: Diagnosis not present

## 2018-09-11 DIAGNOSIS — K219 Gastro-esophageal reflux disease without esophagitis: Secondary | ICD-10-CM | POA: Diagnosis not present

## 2018-10-14 ENCOUNTER — Other Ambulatory Visit: Payer: Self-pay

## 2018-10-14 ENCOUNTER — Ambulatory Visit: Payer: Medicare HMO | Admitting: Podiatry

## 2018-10-14 ENCOUNTER — Encounter: Payer: Self-pay | Admitting: Podiatry

## 2018-10-14 DIAGNOSIS — M79674 Pain in right toe(s): Secondary | ICD-10-CM | POA: Diagnosis not present

## 2018-10-14 DIAGNOSIS — M79675 Pain in left toe(s): Secondary | ICD-10-CM | POA: Diagnosis not present

## 2018-10-14 DIAGNOSIS — E1142 Type 2 diabetes mellitus with diabetic polyneuropathy: Secondary | ICD-10-CM | POA: Diagnosis not present

## 2018-10-14 DIAGNOSIS — B351 Tinea unguium: Secondary | ICD-10-CM | POA: Diagnosis not present

## 2018-10-14 DIAGNOSIS — L600 Ingrowing nail: Secondary | ICD-10-CM

## 2018-10-14 NOTE — Patient Instructions (Addendum)
APPLY NEOSPORIN CREAM TO RIGHT 3RD TOE ONCE DAILY FOR ONE WEEK FOR INGROWN TOENAIL.    Diabetic Neuropathy Diabetic neuropathy refers to nerve damage that is caused by diabetes (diabetes mellitus). Over time, people with diabetes can develop nerve damage throughout the body. There are several types of diabetic neuropathy:  Peripheral neuropathy. This is the most common type of diabetic neuropathy. It causes damage to nerves that carry signals between the spinal cord and other parts of the body (peripheral nerves). This usually affects nerves in the feet and legs first, and may eventually affect the hands and arms. The damage affects the ability to sense touch or temperature.  Autonomic neuropathy. This type causes damage to nerves that control involuntary functions (autonomic nerves). These nerves carry signals that control: ? Heartbeat. ? Body temperature. ? Blood pressure. ? Urination. ? Digestion. ? Sweating. ? Sexual function. ? Response to changing blood sugar (glucose) levels.  Focal neuropathy. This type of nerve damage affects one area of the body, such as an arm, a leg, or the face. The injury may involve one nerve or a small group of nerves. Focal neuropathy can be painful and unpredictable, and occurs most often in older adults with diabetes. This often develops suddenly, but usually improves over time and does not cause long-term problems.  Proximal neuropathy. This type of nerve damage affects the nerves of the thighs, hips, buttocks, or legs. It causes severe pain, weakness, and muscle death (atrophy), usually in the thigh muscles. It is more common among older men and people who have type 2 diabetes. The length of recovery time may vary. What are the causes? Peripheral, autonomic, and focal neuropathies are caused by diabetes that is not well controlled with treatment. The cause of proximal neuropathy is not known, but it may be caused by inflammation related to uncontrolled  blood glucose levels. What are the signs or symptoms? Peripheral neuropathy Peripheral neuropathy develops slowly over time. When the nerves of the feet and legs no longer work, you may experience:  Burning, stabbing, or aching pain in the legs or feet.  Pain or cramping in the legs or feet.  Loss of feeling (numbness) and inability to feel pressure or pain in the feet. This can lead to: ? Thick calluses or sores on areas of constant pressure. ? Ulcers. ? Reduced ability to feel temperature changes.  Foot deformities.  Muscle weakness.  Loss of balance or coordination. Autonomic neuropathy The symptoms of autonomic neuropathy vary depending on which nerves are affected. Symptoms may include:  Problems with digestion, such as: ? Nausea or vomiting. ? Poor appetite. ? Bloating. ? Diarrhea or constipation. ? Trouble swallowing. ? Losing weight without trying to.  Problems with the heart, blood and lungs, such as: ? Dizziness, especially when standing up. ? Fainting. ? Shortness of breath. ? Irregular heartbeat.  Bladder problems, such as: ? Trouble starting or stopping urination. ? Leaking urine. ? Trouble emptying the bladder. ? Urinary tract infections (UTIs).  Problems with other body functions, such as: ? Sweat. You may sweat too much or too little. ? Temperature. You might get hot easily. Or, you might feel cold more than usual. ? Sexual function. Men may not be able to get or maintain an erection. Women may have vaginal dryness and difficulty with arousal. Focal neuropathy Symptoms affect only one area of the body. Common symptoms include:  Numbness.  Tingling.  Burning pain.  Prickling feeling.  Very sensitive skin.  Weakness.  Inability to move (  paralysis).  Muscle twitching.  Muscles getting smaller (wasting).  Poor coordination.  Double or blurred vision. Proximal neuropathy  Sudden, severe pain in the hip, thigh, or buttocks. Pain may  spread from the back into the legs (sciatica).  Pain and numbness in the arms and legs.  Tingling.  Loss of bladder control or bowel control.  Weakness and wasting of thigh muscles.  Difficulty getting up from a seated position.  Abdominal swelling.  Unexplained weight loss. How is this diagnosed? Diagnosis usually involves reviewing your medical history and any symptoms you have. Diagnosis varies depending on the type of neuropathy your health care provider suspects. Peripheral neuropathy Your health care provider will check areas that are affected by your nervous system (neurologic exam), such as your reflexes, how you move, and what you can feel. You may have other tests, such as:  Blood tests.  Removal and examination of fluid that surrounds the spinal cord (lumbar puncture).  CT scan.  MRI.  A test to check the nerves that control muscles (electromyogram, EMG).  Tests of how quickly messages pass through your nerves (nerve conduction velocity tests).  Removal of a small piece of nerve to be examined under a microscope (biopsy). Autonomic neuropathy You may have tests, such as:  Tests to measure your blood pressure and heart rate. This may include monitoring you while you are safely secured to an exam table that moves you from a lying position to an upright position (table tilt test).  Breathing tests to check your lungs.  Tests to check how food moves through the digestive system (gastric emptying tests).  Blood, sweat, or urine tests.  Ultrasound of your bladder.  Spinal fluid tests. Focal neuropathy This condition may be diagnosed with:  A neurologic exam.  CT scan.  MRI.  EMG.  Nerve conduction velocity tests. Proximal neuropathy There is no test to diagnose this type of neuropathy. You may have tests to rule out other possible causes of this type of neuropathy. Tests may include:  X-rays of your spine and lumbar region.  Lumbar puncture.   MRI. How is this treated? The goal of treatment is to keep nerve damage from getting worse. The most important part of treatment is keeping your blood glucose level and your A1C level within your target range by following your diabetes management plan. Over time, maintaining lower blood glucose levels helps lessen symptoms. In some cases, you may need prescription pain medicine. Follow these instructions at home:  Lifestyle   Do not use any products that contain nicotine or tobacco, such as cigarettes and e-cigarettes. If you need help quitting, ask your health care provider.  Be physically active every day. Include strength training and balance exercises.  Follow a healthy meal plan.  Work with your health care provider to manage your blood pressure. General instructions  Follow your diabetes management plan as directed. ? Check your blood glucose levels as directed by your health care provider. ? Keep your blood glucose in your target range as directed by your health care provider. ? Have your A1C level checked at least two times a year, or as often as told by your health care provider.  Take over the counter and prescription medicines only as told by your health care provider. This includes insulin and diabetes medicine.  Do not drive or use heavy machinery while taking prescription pain medicines.  Check your skin and feet every day for cuts, bruises, redness, blisters, or sores.  Keep all follow up  visits as told by your health care provider. This is important. Contact a health care provider if:  You have burning, stabbing, or aching pain in your legs or feet.  You are unable to feel pressure or pain in your feet.  You develop problems with digestion, such as: ? Nausea. ? Vomiting. ? Bloating. ? Constipation. ? Diarrhea. ? Abdominal pain.  You have difficulty with urination, such as inability: ? To control when you urinate (incontinence). ? To completely empty the  bladder (retention).  You have palpitations.  You feel dizzy, weak, or faint when you stand up. Get help right away if:  You cannot urinate.  You have sudden weakness or loss of coordination.  You have trouble speaking.  You have pain or pressure in your chest.  You have an irregular heart beat.  You have sudden inability to move a part of your body. Summary  Diabetic neuropathy refers to nerve damage that is caused by diabetes. It can affect nerves throughout the entire body, causing numbness and pain in the arms, legs, digestive tract, heart, and other body systems.  Keep your blood glucose level and your blood pressure in your target range, as directed by your health care provider. This can help prevent neuropathy from getting worse.  Check your skin and feet every day for cuts, bruises, redness, blisters, or sores.  Do not use any products that contain nicotine or tobacco, such as cigarettes and e-cigarettes. If you need help quitting, ask your health care provider. This information is not intended to replace advice given to you by your health care provider. Make sure you discuss any questions you have with your health care provider. Document Released: 05/14/2001 Document Revised: 04/17/2017 Document Reviewed: 04/09/2016 [employer] Patient Education  2020 [employer]  Onychomycosis/Fungal Toenails  WHAT IS IT? An infection that lies within the keratin of your nail plate that is caused by a fungus.  WHY ME? Fungal infections affect all ages, sexes, races, and creeds.  There may be many factors that predispose you to a fungal infection such as age, coexisting medical conditions such as diabetes, or an autoimmune disease; stress, medications, fatigue, genetics, etc.  Bottom line: fungus thrives in a warm, moist environment and your shoes offer such a location.  IS IT CONTAGIOUS? Theoretically, yes.  You do not want to share shoes, nail clippers or files with someone who has  fungal toenails.  Walking around barefoot in the same room or sleeping in the same bed is unlikely to transfer the organism.  It is important to realize, however, that fungus can spread easily from one nail to the next on the same foot.  HOW DO WE TREAT THIS?  There are several ways to treat this condition.  Treatment may depend on many factors such as age, medications, pregnancy, liver and kidney conditions, etc.  It is best to ask your doctor which options are available to you.  1. No treatment.   Unlike many other medical concerns, you can live with this condition.  However for many people this can be a painful condition and may lead to ingrown toenails or a bacterial infection.  It is recommended that you keep the nails cut short to help reduce the amount of fungal nail. 2. Topical treatment.  These range from herbal remedies to prescription strength nail lacquers.  About 40-50% effective, topicals require twice daily application for approximately 9 to 12 months or until an entirely new nail has grown out.  The most effective  topicals are medical grade medications available through physicians offices. 3. Oral antifungal medications.  With an 80-90% cure rate, the most common oral medication requires 3 to 4 months of therapy and stays in your system for a year as the new nail grows out.  Oral antifungal medications do require blood work to make sure it is a safe drug for you.  A liver function panel will be performed prior to starting the medication and after the first month of treatment.  It is important to have the blood work performed to avoid any harmful side effects.  In general, this medication safe but blood work is required. 4. Laser Therapy.  This treatment is performed by applying a specialized laser to the affected nail plate.  This therapy is noninvasive, fast, and non-painful.  It is not covered by insurance and is therefore, out of pocket.  The results have been very good with a 80-95% cure  rate.  [facility] is the only practice in the area to offer this therapy. 5. Permanent Nail Avulsion.  Removing the entire nail so that a new nail will not grow back.

## 2018-10-16 NOTE — Progress Notes (Signed)
Subjective: Michael Jensen presents today with painful, thick toenails 1-5 b/l that he cannot cut and which interfere with daily activities.  Pain is aggravated when wearing enclosed shoe gear. He voices no new pedal concerns on today's visit.  Billie Ruddy, MD is his PCP.    Current Outpatient Medications:  .  acetaminophen (TYLENOL) 500 MG tablet, Take 500 mg by mouth every 8 (eight) hours as needed., Disp: , Rfl:  .  Ascorbic Acid (VITAMIN C WITH ROSE HIPS) 500 MG tablet, Take 500 mg by mouth daily., Disp: , Rfl:  .  aspirin EC 81 MG tablet, Take 1 tablet (81 mg total) by mouth daily., Disp: 90 tablet, Rfl: 3 .  atorvastatin (LIPITOR) 40 MG tablet, Take 1 tablet (40 mg total) by mouth daily., Disp: 90 tablet, Rfl: 0 .  brimonidine (ALPHAGAN) 0.2 % ophthalmic solution, INSTILL ONE DROP INTO THE RIGHT EYE TWICE DAILY, Disp: , Rfl: 5 .  dorzolamide (TRUSOPT) 2 % ophthalmic solution, INSTILL 1 DROP TWICE A DAY INTO RIGHT EYE, Disp: , Rfl: 5 .  latanoprost (XALATAN) 0.005 % ophthalmic solution, , Disp: , Rfl:  .  lisinopril (PRINIVIL,ZESTRIL) 20 MG tablet, Take 1 tablet (20 mg total) by mouth daily., Disp: 90 tablet, Rfl: 1 .  metFORMIN (GLUCOPHAGE) 500 MG tablet, Take 500 mg by mouth daily., Disp: , Rfl:  .  metoprolol tartrate (LOPRESSOR) 25 MG tablet, Take 25 mg by mouth 2 (two) times daily., Disp: , Rfl:  .  Multiple Vitamins-Minerals (CENTRUM SILVER ADULT 50+ PO), Take 1 tablet by mouth daily., Disp: , Rfl:  .  pantoprazole (PROTONIX) 40 MG tablet, Take 40 mg by mouth daily., Disp: , Rfl:  .  tamsulosin (FLOMAX) 0.4 MG CAPS capsule, TAKE 1 CAPSULE BY MOUTH TWICE A DAY, Disp: 180 capsule, Rfl: 0  No Known Allergies  Objective: Vascular Examination: Capillary refill time <3 seconds x 10 digits.  Dorsalis pedis and Posterior tibial pulses palpable b/l.  Digital hair absent x 10 digits.  Skin temperature gradient WNL b/l  Dermatological Examination: Skin with normal turgor,  texture and tone b/l  Toenails 1-5 b/l discolored, thick, dystrophic with subungual debris and pain with palpation to nailbeds due to thickness of nails. Incurvated nailplate left great toe medial border with tenderness to palpation. No erythema, no edema, no drainage noted.  Musculoskeletal: Muscle strength 5/5 to all LE muscle groups  No gross bony deformities b/l.  No pain, crepitus or joint limitation noted with ROM.   Neurological: Sensation absent with 10 gram monofilament.  Assessment: Painful onychomycosis toenails 1-5 b/l  Ingrown toenail medial border left hallux, noninfected NIDDM with neuropathy  Plan: 1. Toenails 1-5 b/l were debrided in length and girth without iatrogenic bleeding.  Offending nail border debrided and curretaged left hallux. Border cleansed with alcohol and tripe antibiotic applied. Patient instructed to apply triple antibiotic ointment to left great toe once daily for one week.  2. Patient to continue soft, supportive shoe gear daily. 3. Patient to report any pedal injuries to medical professional immediately. 4. Follow up 3 months.  5. Patient/POA to call should there be a concern in the interim.

## 2018-11-07 ENCOUNTER — Other Ambulatory Visit: Payer: Self-pay | Admitting: Family Medicine

## 2018-11-07 NOTE — Telephone Encounter (Signed)
Pt needs an Appointment for further refills

## 2018-12-16 DIAGNOSIS — H401133 Primary open-angle glaucoma, bilateral, severe stage: Secondary | ICD-10-CM | POA: Diagnosis not present

## 2018-12-24 ENCOUNTER — Other Ambulatory Visit: Payer: Self-pay | Admitting: Family Medicine

## 2018-12-24 NOTE — Telephone Encounter (Signed)
Pt needs appointment for further refills 

## 2019-01-07 ENCOUNTER — Other Ambulatory Visit: Payer: Self-pay | Admitting: Family Medicine

## 2019-01-13 ENCOUNTER — Encounter: Payer: Self-pay | Admitting: Podiatry

## 2019-01-13 ENCOUNTER — Ambulatory Visit: Payer: Medicare HMO | Admitting: Podiatry

## 2019-01-13 ENCOUNTER — Other Ambulatory Visit: Payer: Self-pay

## 2019-01-13 DIAGNOSIS — L600 Ingrowing nail: Secondary | ICD-10-CM | POA: Diagnosis not present

## 2019-01-13 DIAGNOSIS — M79675 Pain in left toe(s): Secondary | ICD-10-CM | POA: Diagnosis not present

## 2019-01-13 DIAGNOSIS — E1142 Type 2 diabetes mellitus with diabetic polyneuropathy: Secondary | ICD-10-CM

## 2019-01-13 DIAGNOSIS — M79674 Pain in right toe(s): Secondary | ICD-10-CM | POA: Diagnosis not present

## 2019-01-13 DIAGNOSIS — B351 Tinea unguium: Secondary | ICD-10-CM | POA: Diagnosis not present

## 2019-01-13 NOTE — Patient Instructions (Addendum)
Apply triple antibiotic ointment or Neosporin to left great toe and right 3rd toe once daily for one week.   Ingrown Toenail An ingrown toenail occurs when the corner or sides of a toenail grow into the surrounding skin. This causes discomfort and pain. The big toe is most commonly affected, but any of the toes can be affected. If an ingrown toenail is not treated, it can become infected. What are the causes? This condition may be caused by:  Wearing shoes that are too small or tight.  An injury, such as stubbing your toe or having your toe stepped on.  Improper cutting or care of your toenails.  Having nail or foot abnormalities that were present from birth (congenital abnormalities), such as having a nail that is too big for your toe. What increases the risk? The following factors may make you more likely to develop ingrown toenails:  Age. Nails tend to get thicker with age, so ingrown nails are more common among older people.  Cutting your toenails incorrectly, such as cutting them very short or cutting them unevenly. An ingrown toenail is more likely to get infected if you have:  Diabetes.  Blood flow (circulation) problems. What are the signs or symptoms? Symptoms of an ingrown toenail may include:  Pain, soreness, or tenderness.  Redness.  Swelling.  Hardening of the skin that surrounds the toenail. Signs that an ingrown toenail may be infected include:  Fluid or pus.  Symptoms that get worse instead of better. How is this diagnosed? An ingrown toenail may be diagnosed based on your medical history, your symptoms, and a physical exam. If you have fluid or blood coming from your toenail, a sample may be collected to test for the specific type of bacteria that is causing the infection. How is this treated? Treatment depends on how severe your ingrown toenail is. You may be able to care for your toenail at home.  If you have an infection, you may be prescribed  antibiotic medicines.  If you have fluid or pus draining from your toenail, your health care provider may drain it.  If you have trouble walking, you may be given crutches to use.  If you have a severe or infected ingrown toenail, you may need a procedure to remove part or all of the nail. Follow these instructions at home: Foot care   Do not pick at your toenail or try to remove it yourself.  Soak your foot in warm, soapy water. Do this for 20 minutes, 3 times a day, or as often as told by your health care provider. This helps to keep your toe clean and keep your skin soft.  Wear shoes that fit well and are not too tight. Your health care provider may recommend that you wear open-toed shoes while you heal.  Trim your toenails regularly and carefully. Cut your toenails straight across to prevent injury to the skin at the corners of the toenail. Do not cut your nails in a curved shape.  Keep your feet clean and dry to help prevent infection. Medicines  Take over-the-counter and prescription medicines only as told by your health care provider.  If you were prescribed an antibiotic, take it as told by your health care provider. Do not stop taking the antibiotic even if you start to feel better. Activity  Return to your normal activities as told by your health care provider. Ask your health care provider what activities are safe for you.  Avoid activities that  cause pain. General instructions  If your health care provider told you to use crutches to help you move around, use them as instructed.  Keep all follow-up visits as told by your health care provider. This is important. Contact a health care provider if:  You have more redness, swelling, pain, or other symptoms that do not improve with treatment.  You have fluid, blood, or pus coming from your toenail. Get help right away if:  You have a red streak on your skin that starts at your foot and spreads up your leg.  You have  a fever. Summary  An ingrown toenail occurs when the corner or sides of a toenail grow into the surrounding skin. This causes discomfort and pain. The big toe is most commonly affected, but any of the toes can be affected.  If an ingrown toenail is not treated, it can become infected.  Fluid or pus draining from your toenail is a sign of infection. Your health care provider may need to drain it. You may be given antibiotics to treat the infection.  Trimming your toenails regularly and properly can help you prevent an ingrown toenail. This information is not intended to replace advice given to you by your health care provider. Make sure you discuss any questions you have with your health care provider. Document Released: 03/02/2000 Document Revised: 06/27/2018 Document Reviewed: 11/21/2016 Elsevier Patient Education  2020 [employer].   Diabetic Neuropathy Diabetic neuropathy refers to nerve damage that is caused by diabetes (diabetes mellitus). Over time, people with diabetes can develop nerve damage throughout the body. There are several types of diabetic neuropathy:  Peripheral neuropathy. This is the most common type of diabetic neuropathy. It causes damage to nerves that carry signals between the spinal cord and other parts of the body (peripheral nerves). This usually affects nerves in the feet and legs first, and may eventually affect the hands and arms. The damage affects the ability to sense touch or temperature.  Autonomic neuropathy. This type causes damage to nerves that control involuntary functions (autonomic nerves). These nerves carry signals that control: ? Heartbeat. ? Body temperature. ? Blood pressure. ? Urination. ? Digestion. ? Sweating. ? Sexual function. ? Response to changing blood sugar (glucose) levels.  Focal neuropathy. This type of nerve damage affects one area of the body, such as an arm, a leg, or the face. The injury may involve one nerve or a small  group of nerves. Focal neuropathy can be painful and unpredictable, and occurs most often in older adults with diabetes. This often develops suddenly, but usually improves over time and does not cause long-term problems.  Proximal neuropathy. This type of nerve damage affects the nerves of the thighs, hips, buttocks, or legs. It causes severe pain, weakness, and muscle death (atrophy), usually in the thigh muscles. It is more common among older men and people who have type 2 diabetes. The length of recovery time may vary. What are the causes? Peripheral, autonomic, and focal neuropathies are caused by diabetes that is not well controlled with treatment. The cause of proximal neuropathy is not known, but it may be caused by inflammation related to uncontrolled blood glucose levels. What are the signs or symptoms? Peripheral neuropathy Peripheral neuropathy develops slowly over time. When the nerves of the feet and legs no longer work, you may experience:  Burning, stabbing, or aching pain in the legs or feet.  Pain or cramping in the legs or feet.  Loss of feeling (numbness) and  inability to feel pressure or pain in the feet. This can lead to: ? Thick calluses or sores on areas of constant pressure. ? Ulcers. ? Reduced ability to feel temperature changes.  Foot deformities.  Muscle weakness.  Loss of balance or coordination. Autonomic neuropathy The symptoms of autonomic neuropathy vary depending on which nerves are affected. Symptoms may include:  Problems with digestion, such as: ? Nausea or vomiting. ? Poor appetite. ? Bloating. ? Diarrhea or constipation. ? Trouble swallowing. ? Losing weight without trying to.  Problems with the heart, blood and lungs, such as: ? Dizziness, especially when standing up. ? Fainting. ? Shortness of breath. ? Irregular heartbeat.  Bladder problems, such as: ? Trouble starting or stopping urination. ? Leaking urine. ? Trouble emptying the  bladder. ? Urinary tract infections (UTIs).  Problems with other body functions, such as: ? Sweat. You may sweat too much or too little. ? Temperature. You might get hot easily. Or, you might feel cold more than usual. ? Sexual function. Men may not be able to get or maintain an erection. Women may have vaginal dryness and difficulty with arousal. Focal neuropathy Symptoms affect only one area of the body. Common symptoms include:  Numbness.  Tingling.  Burning pain.  Prickling feeling.  Very sensitive skin.  Weakness.  Inability to move (paralysis).  Muscle twitching.  Muscles getting smaller (wasting).  Poor coordination.  Double or blurred vision. Proximal neuropathy  Sudden, severe pain in the hip, thigh, or buttocks. Pain may spread from the back into the legs (sciatica).  Pain and numbness in the arms and legs.  Tingling.  Loss of bladder control or bowel control.  Weakness and wasting of thigh muscles.  Difficulty getting up from a seated position.  Abdominal swelling.  Unexplained weight loss. How is this diagnosed? Diagnosis usually involves reviewing your medical history and any symptoms you have. Diagnosis varies depending on the type of neuropathy your health care provider suspects. Peripheral neuropathy Your health care provider will check areas that are affected by your nervous system (neurologic exam), such as your reflexes, how you move, and what you can feel. You may have other tests, such as:  Blood tests.  Removal and examination of fluid that surrounds the spinal cord (lumbar puncture).  CT scan.  MRI.  A test to check the nerves that control muscles (electromyogram, EMG).  Tests of how quickly messages pass through your nerves (nerve conduction velocity tests).  Removal of a small piece of nerve to be examined under a microscope (biopsy). Autonomic neuropathy You may have tests, such as:  Tests to measure your blood pressure  and heart rate. This may include monitoring you while you are safely secured to an exam table that moves you from a lying position to an upright position (table tilt test).  Breathing tests to check your lungs.  Tests to check how food moves through the digestive system (gastric emptying tests).  Blood, sweat, or urine tests.  Ultrasound of your bladder.  Spinal fluid tests. Focal neuropathy This condition may be diagnosed with:  A neurologic exam.  CT scan.  MRI.  EMG.  Nerve conduction velocity tests. Proximal neuropathy There is no test to diagnose this type of neuropathy. You may have tests to rule out other possible causes of this type of neuropathy. Tests may include:  X-rays of your spine and lumbar region.  Lumbar puncture.  MRI. How is this treated? The goal of treatment is to keep nerve damage from  getting worse. The most important part of treatment is keeping your blood glucose level and your A1C level within your target range by following your diabetes management plan. Over time, maintaining lower blood glucose levels helps lessen symptoms. In some cases, you may need prescription pain medicine. Follow these instructions at home:  Lifestyle   Do not use any products that contain nicotine or tobacco, such as cigarettes and e-cigarettes. If you need help quitting, ask your health care provider.  Be physically active every day. Include strength training and balance exercises.  Follow a healthy meal plan.  Work with your health care provider to manage your blood pressure. General instructions  Follow your diabetes management plan as directed. ? Check your blood glucose levels as directed by your health care provider. ? Keep your blood glucose in your target range as directed by your health care provider. ? Have your A1C level checked at least two times a year, or as often as told by your health care provider.  Take over the counter and prescription  medicines only as told by your health care provider. This includes insulin and diabetes medicine.  Do not drive or use heavy machinery while taking prescription pain medicines.  Check your skin and feet every day for cuts, bruises, redness, blisters, or sores.  Keep all follow up visits as told by your health care provider. This is important. Contact a health care provider if:  You have burning, stabbing, or aching pain in your legs or feet.  You are unable to feel pressure or pain in your feet.  You develop problems with digestion, such as: ? Nausea. ? Vomiting. ? Bloating. ? Constipation. ? Diarrhea. ? Abdominal pain.  You have difficulty with urination, such as inability: ? To control when you urinate (incontinence). ? To completely empty the bladder (retention).  You have palpitations.  You feel dizzy, weak, or faint when you stand up. Get help right away if:  You cannot urinate.  You have sudden weakness or loss of coordination.  You have trouble speaking.  You have pain or pressure in your chest.  You have an irregular heart beat.  You have sudden inability to move a part of your body. Summary  Diabetic neuropathy refers to nerve damage that is caused by diabetes. It can affect nerves throughout the entire body, causing numbness and pain in the arms, legs, digestive tract, heart, and other body systems.  Keep your blood glucose level and your blood pressure in your target range, as directed by your health care provider. This can help prevent neuropathy from getting worse.  Check your skin and feet every day for cuts, bruises, redness, blisters, or sores.  Do not use any products that contain nicotine or tobacco, such as cigarettes and e-cigarettes. If you need help quitting, ask your health care provider. This information is not intended to replace advice given to you by your health care provider. Make sure you discuss any questions you have with your health  care provider. Document Released: 05/14/2001 Document Revised: 04/17/2017 Document Reviewed: 04/09/2016 Elsevier Patient Education  2020 [employer].

## 2019-01-15 NOTE — Progress Notes (Signed)
Subjective: Michael Jensen is seen today for preventative diabetic foot care  follow up. He is see for  painful, elongated, thickened toenails 1-5 b/l feet that he cannot cut. Pain interferes with daily activities. Aggravating factor includes wearing enclosed shoe gear and relieved with periodic debridement.  He voices no new pedal concerns on today's visit.   Current Outpatient Medications on File Prior to Visit  Medication Sig  . acetaminophen (TYLENOL) 500 MG tablet Take 500 mg by mouth every 8 (eight) hours as needed.  . Ascorbic Acid (VITAMIN C WITH ROSE HIPS) 500 MG tablet Take 500 mg by mouth daily.  Marland Kitchen aspirin EC 81 MG tablet Take 1 tablet (81 mg total) by mouth daily.  Marland Kitchen atorvastatin (LIPITOR) 40 MG tablet Take 1 tablet (40 mg total) by mouth daily.  . brimonidine (ALPHAGAN) 0.2 % ophthalmic solution INSTILL ONE DROP INTO THE RIGHT EYE TWICE DAILY  . dorzolamide (TRUSOPT) 2 % ophthalmic solution INSTILL 1 DROP TWICE A DAY INTO RIGHT EYE  . latanoprost (XALATAN) 0.005 % ophthalmic solution   . lisinopril (PRINIVIL,ZESTRIL) 20 MG tablet Take 1 tablet (20 mg total) by mouth daily.  . metFORMIN (GLUCOPHAGE) 500 MG tablet Take 500 mg by mouth daily.  . metoprolol tartrate (LOPRESSOR) 25 MG tablet Take 25 mg by mouth 2 (two) times daily.  . Multiple Vitamins-Minerals (CENTRUM SILVER ADULT 50+ PO) Take 1 tablet by mouth daily.  . pantoprazole (PROTONIX) 40 MG tablet Take 40 mg by mouth daily.  . tamsulosin (FLOMAX) 0.4 MG CAPS capsule TAKE 1 CAPSULE BY MOUTH TWICE A DAY   No current facility-administered medications on file prior to visit.      No Known Allergies   Objective:  Vascular Examination: Capillary refill time <3 seconds b/l.  Dorsalis pedis present b/l.  Posterior tibial pulses present b/l.  Digital hair  present x 10 digits.  Skin temperature gradient WNL b/l.   Dermatological Examination: Skin with normal turgor, texture and tone b/l.  Toenails 1-5 b/l  discolored, thick, dystrophic with subungual debris and pain with palpation to nailbeds due to thickness of nails.  Incurvated nailplate left great toe and right 3rd digit with tenderness to palpation. No erythema, no edema, no drainage noted.  Musculoskeletal: Muscle strength 5/5 to all LE muscle groups b/l.  No gross bony deformities b/l.  No pain, crepitus or joint limitation noted with ROM.   Neurological Examination: Protective sensation diminished with 10 gram monofilament bilaterally.  Assessment: Painful onychomycosis toenails 1-5 b/l  Ingrown toenail left hallux, noninfected Ingrown toenail right 3rd digit, noninfected NIDDM with neuropathy  Plan: 1. Toenails 1-5 b/l were debrided in length and girth without iatrogenic bleeding. Offending nail border debrided and curretaged left hallux and right 3rd digit. Borders cleansed with alcohol and triple antibiotic applied. Written instructions given to apply triple antibiotic ointment to digits once daily for one week. 2. Patient to continue soft, supportive shoe gear daily.  3. Patient to report any pedal injuries to medical professional immediately. 4. Follow up 3 months.  5. Patient/POA to call should there be a concern in the interim.

## 2019-01-30 DIAGNOSIS — H409 Unspecified glaucoma: Secondary | ICD-10-CM | POA: Diagnosis not present

## 2019-01-30 DIAGNOSIS — N400 Enlarged prostate without lower urinary tract symptoms: Secondary | ICD-10-CM | POA: Diagnosis not present

## 2019-01-30 DIAGNOSIS — I10 Essential (primary) hypertension: Secondary | ICD-10-CM | POA: Diagnosis not present

## 2019-01-30 DIAGNOSIS — Z0000 Encounter for general adult medical examination without abnormal findings: Secondary | ICD-10-CM | POA: Diagnosis not present

## 2019-01-30 DIAGNOSIS — E782 Mixed hyperlipidemia: Secondary | ICD-10-CM | POA: Diagnosis not present

## 2019-01-30 DIAGNOSIS — D696 Thrombocytopenia, unspecified: Secondary | ICD-10-CM | POA: Diagnosis not present

## 2019-01-30 DIAGNOSIS — E1140 Type 2 diabetes mellitus with diabetic neuropathy, unspecified: Secondary | ICD-10-CM | POA: Diagnosis not present

## 2019-01-30 DIAGNOSIS — Z125 Encounter for screening for malignant neoplasm of prostate: Secondary | ICD-10-CM | POA: Diagnosis not present

## 2019-02-09 DIAGNOSIS — I10 Essential (primary) hypertension: Secondary | ICD-10-CM | POA: Diagnosis not present

## 2019-02-09 DIAGNOSIS — N400 Enlarged prostate without lower urinary tract symptoms: Secondary | ICD-10-CM | POA: Diagnosis not present

## 2019-02-09 DIAGNOSIS — H409 Unspecified glaucoma: Secondary | ICD-10-CM | POA: Diagnosis not present

## 2019-02-09 DIAGNOSIS — D696 Thrombocytopenia, unspecified: Secondary | ICD-10-CM | POA: Diagnosis not present

## 2019-02-09 DIAGNOSIS — Z23 Encounter for immunization: Secondary | ICD-10-CM | POA: Diagnosis not present

## 2019-02-09 DIAGNOSIS — E1140 Type 2 diabetes mellitus with diabetic neuropathy, unspecified: Secondary | ICD-10-CM | POA: Diagnosis not present

## 2019-02-09 DIAGNOSIS — E782 Mixed hyperlipidemia: Secondary | ICD-10-CM | POA: Diagnosis not present

## 2019-02-09 DIAGNOSIS — Z125 Encounter for screening for malignant neoplasm of prostate: Secondary | ICD-10-CM | POA: Diagnosis not present

## 2019-02-09 DIAGNOSIS — Z0000 Encounter for general adult medical examination without abnormal findings: Secondary | ICD-10-CM | POA: Diagnosis not present

## 2019-04-21 ENCOUNTER — Encounter: Payer: Self-pay | Admitting: Podiatry

## 2019-04-21 ENCOUNTER — Other Ambulatory Visit: Payer: Self-pay

## 2019-04-21 ENCOUNTER — Ambulatory Visit: Payer: Medicare HMO | Admitting: Podiatry

## 2019-04-21 DIAGNOSIS — E1142 Type 2 diabetes mellitus with diabetic polyneuropathy: Secondary | ICD-10-CM

## 2019-04-21 DIAGNOSIS — M79674 Pain in right toe(s): Secondary | ICD-10-CM | POA: Diagnosis not present

## 2019-04-21 DIAGNOSIS — B351 Tinea unguium: Secondary | ICD-10-CM

## 2019-04-21 DIAGNOSIS — M2141 Flat foot [pes planus] (acquired), right foot: Secondary | ICD-10-CM

## 2019-04-21 DIAGNOSIS — M79675 Pain in left toe(s): Secondary | ICD-10-CM

## 2019-04-21 DIAGNOSIS — M2142 Flat foot [pes planus] (acquired), left foot: Secondary | ICD-10-CM

## 2019-04-21 NOTE — Patient Instructions (Signed)
Diabetes Mellitus and Foot Care Foot care is an important part of your health, especially when you have diabetes. Diabetes may cause you to have problems because of poor blood flow (circulation) to your feet and legs, which can cause your skin to:  Become thinner and drier.  Break more easily.  Heal more slowly.  Peel and crack. You may also have nerve damage (neuropathy) in your legs and feet, causing decreased feeling in them. This means that you may not notice minor injuries to your feet that could lead to more serious problems. Noticing and addressing any potential problems early is the best way to prevent future foot problems. How to care for your feet Foot hygiene  Wash your feet daily with warm water and mild soap. Do not use hot water. Then, pat your feet and the areas between your toes until they are completely dry. Do not soak your feet as this can dry your skin.  Trim your toenails straight across. Do not dig under them or around the cuticle. File the edges of your nails with an emery board or nail file.  Apply a moisturizing lotion or petroleum jelly to the skin on your feet and to dry, brittle toenails. Use lotion that does not contain alcohol and is unscented. Do not apply lotion between your toes. Shoes and socks  Wear clean socks or stockings every day. Make sure they are not too tight. Do not wear knee-high stockings since they may decrease blood flow to your legs.  Wear shoes that fit properly and have enough cushioning. Always look in your shoes before you put them on to be sure there are no objects inside.  To break in new shoes, wear them for just a few hours a day. This prevents injuries on your feet. Wounds, scrapes, corns, and calluses  Check your feet daily for blisters, cuts, bruises, sores, and redness. If you cannot see the bottom of your feet, use a mirror or ask someone for help.  Do not cut corns or calluses or try to remove them with medicine.  If you  find a minor scrape, cut, or break in the skin on your feet, keep it and the skin around it clean and dry. You may clean these areas with mild soap and water. Do not clean the area with peroxide, alcohol, or iodine.  If you have a wound, scrape, corn, or callus on your foot, look at it several times a day to make sure it is healing and not infected. Check for: ? Redness, swelling, or pain. ? Fluid or blood. ? Warmth. ? Pus or a bad smell. General instructions  Do not cross your legs. This may decrease blood flow to your feet.  Do not use heating pads or hot water bottles on your feet. They may burn your skin. If you have lost feeling in your feet or legs, you may not know this is happening until it is too late.  Protect your feet from hot and cold by wearing shoes, such as at the beach or on hot pavement.  Schedule a complete foot exam at least once a year (annually) or more often if you have foot problems. If you have foot problems, report any cuts, sores, or bruises to your health care provider immediately. Contact a health care provider if:  You have a medical condition that increases your risk of infection and you have any cuts, sores, or bruises on your feet.  You have an injury that is not   healing.  You have redness on your legs or feet.  You feel burning or tingling in your legs or feet.  You have pain or cramps in your legs and feet.  Your legs or feet are numb.  Your feet always feel cold.  You have pain around a toenail. Get help right away if:  You have a wound, scrape, corn, or callus on your foot and: ? You have pain, swelling, or redness that gets worse. ? You have fluid or blood coming from the wound, scrape, corn, or callus. ? Your wound, scrape, corn, or callus feels warm to the touch. ? You have pus or a bad smell coming from the wound, scrape, corn, or callus. ? You have a fever. ? You have a red line going up your leg. Summary  Check your feet every day  for cuts, sores, red spots, swelling, and blisters.  Moisturize feet and legs daily.  Wear shoes that fit properly and have enough cushioning.  If you have foot problems, report any cuts, sores, or bruises to your health care provider immediately.  Schedule a complete foot exam at least once a year (annually) or more often if you have foot problems. This information is not intended to replace advice given to you by your health care provider. Make sure you discuss any questions you have with your health care provider. Document Revised: 11/26/2018 Document Reviewed: 04/06/2016 Elsevier Patient Education  2020 [employer]  

## 2019-04-22 NOTE — Progress Notes (Signed)
Subjective: Michael Jensen presents today for follow up of preventative diabetic foot care and painful mycotic nails b/l that are difficult to trim. Pain interferes with ambulation. Aggravating factors include wearing enclosed shoe gear. Pain is relieved with periodic professional debridement   He has known history of neuropathy as well. He is requesting diabetic shoes on today's visit. Patient states they have switched to [facility] and he sees Peri Maris, FNP.   No Known Allergies   Objective: There were no vitals filed for this visit.  Vascular Examination:  Capillary fill time to digits <3s b/l, palpable DP pulses b/l, palpable PT pulses b/l, pedal hair present b/l and skin temperature gradient within normal limits b/l  Dermatological Examination: Pedal skin with normal turgor, texture and tone bilaterally, no open wounds bilaterally, no interdigital macerations bilaterally and toenails 1-5 b/l elongated, dystrophic, thickened, crumbly with subungual debris  Musculoskeletal: Normal muscle strength 5/5 to all lower extremity muscle groups bilaterally, no pain crepitus or joint limitation noted with ROM b/l and pes planus deformity noted  Neurological: Protective sensation decreased with 10 gram monofilament b/l and vibratory sensation decreased b/l  Assessment: 1. Pain due to onychomycosis of toenails of both feet   2. Pes planus of both feet   3. Diabetic peripheral neuropathy associated with type 2 diabetes mellitus (HCC)      Plan: -Continue diabetic foot care principles. Literature dispensed on today.  -Toenails 1-5 b/l were debrided in length and girth without iatrogenic bleeding. -Patient to continue soft, supportive shoe gear daily. Explained to patient he would need his diabetic shoe certification form completed by an M.D. or D.O. Wrote information on prescription pad for him and his wife to give to staff at [facility] with this information. -Patient to report  any pedal injuries to medical professional immediately. -Patient/POA to call should there be question/concern in the interim.  Return in about 3 months (around 07/19/2019) for diabetic nail trim.

## 2019-05-03 ENCOUNTER — Ambulatory Visit: Payer: Medicare HMO

## 2019-06-16 DIAGNOSIS — H401113 Primary open-angle glaucoma, right eye, severe stage: Secondary | ICD-10-CM | POA: Diagnosis not present

## 2019-06-30 DIAGNOSIS — Z7984 Long term (current) use of oral hypoglycemic drugs: Secondary | ICD-10-CM | POA: Diagnosis not present

## 2019-06-30 DIAGNOSIS — D696 Thrombocytopenia, unspecified: Secondary | ICD-10-CM | POA: Diagnosis not present

## 2019-06-30 DIAGNOSIS — E782 Mixed hyperlipidemia: Secondary | ICD-10-CM | POA: Diagnosis not present

## 2019-06-30 DIAGNOSIS — E1140 Type 2 diabetes mellitus with diabetic neuropathy, unspecified: Secondary | ICD-10-CM | POA: Diagnosis not present

## 2019-06-30 DIAGNOSIS — I10 Essential (primary) hypertension: Secondary | ICD-10-CM | POA: Diagnosis not present

## 2019-07-07 DIAGNOSIS — H409 Unspecified glaucoma: Secondary | ICD-10-CM | POA: Diagnosis not present

## 2019-07-07 DIAGNOSIS — I10 Essential (primary) hypertension: Secondary | ICD-10-CM | POA: Diagnosis not present

## 2019-07-07 DIAGNOSIS — E1140 Type 2 diabetes mellitus with diabetic neuropathy, unspecified: Secondary | ICD-10-CM | POA: Diagnosis not present

## 2019-07-07 DIAGNOSIS — Z7984 Long term (current) use of oral hypoglycemic drugs: Secondary | ICD-10-CM | POA: Diagnosis not present

## 2019-07-07 DIAGNOSIS — N400 Enlarged prostate without lower urinary tract symptoms: Secondary | ICD-10-CM | POA: Diagnosis not present

## 2019-07-07 DIAGNOSIS — E782 Mixed hyperlipidemia: Secondary | ICD-10-CM | POA: Diagnosis not present

## 2019-07-07 DIAGNOSIS — M170 Bilateral primary osteoarthritis of knee: Secondary | ICD-10-CM | POA: Diagnosis not present

## 2019-07-20 DIAGNOSIS — H409 Unspecified glaucoma: Secondary | ICD-10-CM | POA: Diagnosis not present

## 2019-07-20 DIAGNOSIS — E782 Mixed hyperlipidemia: Secondary | ICD-10-CM | POA: Diagnosis not present

## 2019-07-20 DIAGNOSIS — E1140 Type 2 diabetes mellitus with diabetic neuropathy, unspecified: Secondary | ICD-10-CM | POA: Diagnosis not present

## 2019-07-20 DIAGNOSIS — N400 Enlarged prostate without lower urinary tract symptoms: Secondary | ICD-10-CM | POA: Diagnosis not present

## 2019-07-20 DIAGNOSIS — I10 Essential (primary) hypertension: Secondary | ICD-10-CM | POA: Diagnosis not present

## 2019-07-20 DIAGNOSIS — M170 Bilateral primary osteoarthritis of knee: Secondary | ICD-10-CM | POA: Diagnosis not present

## 2019-07-22 ENCOUNTER — Ambulatory Visit: Payer: Medicare HMO | Admitting: Podiatry

## 2019-07-22 ENCOUNTER — Encounter: Payer: Self-pay | Admitting: Podiatry

## 2019-07-22 ENCOUNTER — Other Ambulatory Visit: Payer: Self-pay

## 2019-07-22 VITALS — Temp 97.2°F

## 2019-07-22 DIAGNOSIS — M79675 Pain in left toe(s): Secondary | ICD-10-CM | POA: Diagnosis not present

## 2019-07-22 DIAGNOSIS — B351 Tinea unguium: Secondary | ICD-10-CM | POA: Diagnosis not present

## 2019-07-22 DIAGNOSIS — E1142 Type 2 diabetes mellitus with diabetic polyneuropathy: Secondary | ICD-10-CM

## 2019-07-22 DIAGNOSIS — M79674 Pain in right toe(s): Secondary | ICD-10-CM | POA: Diagnosis not present

## 2019-07-22 NOTE — Patient Instructions (Signed)
Diabetes Mellitus and Foot Care Foot care is an important part of your health, especially when you have diabetes. Diabetes may cause you to have problems because of poor blood flow (circulation) to your feet and legs, which can cause your skin to:  Become thinner and drier.  Break more easily.  Heal more slowly.  Peel and crack. You may also have nerve damage (neuropathy) in your legs and feet, causing decreased feeling in them. This means that you may not notice minor injuries to your feet that could lead to more serious problems. Noticing and addressing any potential problems early is the best way to prevent future foot problems. How to care for your feet Foot hygiene  Wash your feet daily with warm water and mild soap. Do not use hot water. Then, pat your feet and the areas between your toes until they are completely dry. Do not soak your feet as this can dry your skin.  Trim your toenails straight across. Do not dig under them or around the cuticle. File the edges of your nails with an emery board or nail file.  Apply a moisturizing lotion or petroleum jelly to the skin on your feet and to dry, brittle toenails. Use lotion that does not contain alcohol and is unscented. Do not apply lotion between your toes. Shoes and socks  Wear clean socks or stockings every day. Make sure they are not too tight. Do not wear knee-high stockings since they may decrease blood flow to your legs.  Wear shoes that fit properly and have enough cushioning. Always look in your shoes before you put them on to be sure there are no objects inside.  To break in new shoes, wear them for just a few hours a day. This prevents injuries on your feet. Wounds, scrapes, corns, and calluses  Check your feet daily for blisters, cuts, bruises, sores, and redness. If you cannot see the bottom of your feet, use a mirror or ask someone for help.  Do not cut corns or calluses or try to remove them with medicine.  If you  find a minor scrape, cut, or break in the skin on your feet, keep it and the skin around it clean and dry. You may clean these areas with mild soap and water. Do not clean the area with peroxide, alcohol, or iodine.  If you have a wound, scrape, corn, or callus on your foot, look at it several times a day to make sure it is healing and not infected. Check for: ? Redness, swelling, or pain. ? Fluid or blood. ? Warmth. ? Pus or a bad smell. General instructions  Do not cross your legs. This may decrease blood flow to your feet.  Do not use heating pads or hot water bottles on your feet. They may burn your skin. If you have lost feeling in your feet or legs, you may not know this is happening until it is too late.  Protect your feet from hot and cold by wearing shoes, such as at the beach or on hot pavement.  Schedule a complete foot exam at least once a year (annually) or more often if you have foot problems. If you have foot problems, report any cuts, sores, or bruises to your health care provider immediately. Contact a health care provider if:  You have a medical condition that increases your risk of infection and you have any cuts, sores, or bruises on your feet.  You have an injury that is not   healing.  You have redness on your legs or feet.  You feel burning or tingling in your legs or feet.  You have pain or cramps in your legs and feet.  Your legs or feet are numb.  Your feet always feel cold.  You have pain around a toenail. Get help right away if:  You have a wound, scrape, corn, or callus on your foot and: ? You have pain, swelling, or redness that gets worse. ? You have fluid or blood coming from the wound, scrape, corn, or callus. ? Your wound, scrape, corn, or callus feels warm to the touch. ? You have pus or a bad smell coming from the wound, scrape, corn, or callus. ? You have a fever. ? You have a red line going up your leg. Summary  Check your feet every day  for cuts, sores, red spots, swelling, and blisters.  Moisturize feet and legs daily.  Wear shoes that fit properly and have enough cushioning.  If you have foot problems, report any cuts, sores, or bruises to your health care provider immediately.  Schedule a complete foot exam at least once a year (annually) or more often if you have foot problems. This information is not intended to replace advice given to you by your health care provider. Make sure you discuss any questions you have with your health care provider. Document Revised: 11/26/2018 Document Reviewed: 04/06/2016 Elsevier Patient Education  2020 [employer]  Peripheral Neuropathy Peripheral neuropathy is a type of nerve damage. It affects nerves that carry signals between the spinal cord and the arms, legs, and the rest of the body (peripheral nerves). It does not affect nerves in the spinal cord or brain. In peripheral neuropathy, one nerve or a group of nerves may be damaged. Peripheral neuropathy is a broad category that includes many specific nerve disorders, like diabetic neuropathy, hereditary neuropathy, and carpal tunnel syndrome. What are the causes? This condition may be caused by:  Diabetes. This is the most common cause of peripheral neuropathy.  Nerve injury.  Pressure or stress on a nerve that lasts a long time.  Lack (deficiency) of B vitamins. This can result from alcoholism, poor diet, or a restricted diet.  Infections.  Autoimmune diseases, such as rheumatoid arthritis and systemic lupus erythematosus.  Nerve diseases that are passed from parent to child (inherited).  Some medicines, such as cancer medicines (chemotherapy).  Poisonous (toxic) substances, such as lead and mercury.  Too little blood flowing to the legs.  Kidney disease.  Thyroid disease. In some cases, the cause of this condition is not known. What are the signs or symptoms? Symptoms of this condition depend on which of your  nerves is damaged. Common symptoms include:  Loss of feeling (numbness) in the feet, hands, or both.  Tingling in the feet, hands, or both.  Burning pain.  Very sensitive skin.  Weakness.  Not being able to move a part of the body (paralysis).  Muscle twitching.  Clumsiness or poor coordination.  Loss of balance.  Not being able to control your bladder.  Feeling dizzy.  Sexual problems. How is this diagnosed? Diagnosing and finding the cause of peripheral neuropathy can be difficult. Your health care provider will take your medical history and do a physical exam. A neurological exam will also be done. This involves checking things that are affected by your brain, spinal cord, and nerves (nervous system). For example, your health care provider will check your reflexes, how you move, and   what you can feel. You may have other tests, such as:  Blood tests.  Electromyogram (EMG) and nerve conduction tests. These tests check nerve function and how well the nerves are controlling the muscles.  Imaging tests, such as CT scans or MRI to rule out other causes of your symptoms.  Removing a small piece of nerve to be examined in a lab (nerve biopsy). This is rare.  Removing and examining a small amount of the fluid that surrounds the brain and spinal cord (lumbar puncture). This is rare. How is this treated? Treatment for this condition may involve:  Treating the underlying cause of the neuropathy, such as diabetes, kidney disease, or vitamin deficiencies.  Stopping medicines that can cause neuropathy, such as chemotherapy.  Medicine to relieve pain. Medicines may include: ? Prescription or over-the-counter pain medicine. ? Antiseizure medicine. ? Antidepressants. ? Pain-relieving patches that are applied to painful areas of skin.  Surgery to relieve pressure on a nerve or to destroy a nerve that is causing pain.  Physical therapy to help improve movement and  balance.  Devices to help you move around (assistive devices). Follow these instructions at home: Medicines  Take over-the-counter and prescription medicines only as told by your health care provider. Do not take any other medicines without first asking your health care provider.  Do not drive or use heavy machinery while taking prescription pain medicine. Lifestyle   Do not use any products that contain nicotine or tobacco, such as cigarettes and e-cigarettes. Smoking keeps blood from reaching damaged nerves. If you need help quitting, ask your health care provider.  Avoid or limit alcohol. Too much alcohol can cause a vitamin B deficiency, and vitamin B is needed for healthy nerves.  Eat a healthy diet. This includes: ? Eating foods that are high in fiber, such as fresh fruits and vegetables, whole grains, and beans. ? Limiting foods that are high in fat and processed sugars, such as fried or sweet foods. General instructions   If you have diabetes, work closely with your health care provider to keep your blood sugar under control.  If you have numbness in your feet: ? Check every day for signs of injury or infection. Watch for redness, warmth, and swelling. ? Wear padded socks and comfortable shoes. These help protect your feet.  Develop a good support system. Living with peripheral neuropathy can be stressful. Consider talking with a mental health specialist or joining a support group.  Use assistive devices and attend physical therapy as told by your health care provider. This may include using a walker or a cane.  Keep all follow-up visits as told by your health care provider. This is important. Contact a health care provider if:  You have new signs or symptoms of peripheral neuropathy.  You are struggling emotionally from dealing with peripheral neuropathy.  Your pain is not well-controlled. Get help right away if:  You have an injury or infection that is not healing  normally.  You develop new weakness in an arm or leg.  You fall frequently. Summary  Peripheral neuropathy is when the nerves in the arms, or legs are damaged, resulting in numbness, weakness, or pain.  There are many causes of peripheral neuropathy, including diabetes, pinched nerves, vitamin deficiencies, autoimmune disease, and hereditary conditions.  Diagnosing and finding the cause of peripheral neuropathy can be difficult. Your health care provider will take your medical history, do a physical exam, and do tests, including blood tests and nerve function tests.    Treatment involves treating the underlying cause of the neuropathy and taking medicines to help control pain. Physical therapy and assistive devices may also help. This information is not intended to replace advice given to you by your health care provider. Make sure you discuss any questions you have with your health care provider. Document Revised: 02/15/2017 Document Reviewed: 05/14/2016 Elsevier Patient Education  2020 [employer]  

## 2019-07-22 NOTE — Progress Notes (Signed)
Subjective: Michael Jensen presents today for follow up of preventative diabetic foot care and painful mycotic nails b/l that are difficult to trim. Pain interferes with ambulation. Aggravating factors include wearing enclosed shoe gear. Pain is relieved with periodic professional debridement.   He is inquiring about his diabetic shoes.  No Known Allergies   Objective: Vitals:   07/22/19 1300  Temp: (!) 97.2 F (36.2 C)    Pt is a pleasant 84 y.o. year old AA male in NAD. AAO x 3.   Vascular Examination:  Capillary fill time to digits <3 seconds b/l. Palpable DP pulses b/l. Palpable PT pulses b/l. Pedal hair sparse b/l. Skin temperature gradient within normal limits b/l. No edema noted b/l. No pain with calf compression b/l.  Dermatological Examination: Pedal skin with normal turgor, texture and tone bilaterally. No open wounds bilaterally. No interdigital macerations bilaterally. Toenails 1-5 b/l elongated, dystrophic, thickened, crumbly with subungual debris and tenderness to dorsal palpation.  Musculoskeletal: Normal muscle strength 5/5 to all lower extremity muscle groups bilaterally. No pain crepitus or joint limitation noted with ROM b/l. Pes planus deformity noted b/l.  Utilizes rollator for ambulation assistance.  Neurological: Protective sensation decreased with 10 gram monofilament b/l. Vibratory sensation decreased b/l.  Assessment: 1. Pain due to onychomycosis of toenails of both feet   2. Diabetic peripheral neuropathy associated with type 2 diabetes mellitus (HCC)    Plan: -No new findings. No new orders. -Continue diabetic foot care principles. Literature dispensed on today.  -Toenails 1-5 b/l were debrided in length and girth with sterile nail nippers and dremel without iatrogenic bleeding.  -Patient to continue soft, supportive shoe gear daily. -Patient to report any pedal injuries to medical professional immediately. -Patient will schedule appointment with Fenton Foy  or Raiford Noble for measurement of feet for diabetic shoes at his convenience. -Patient/POA to call should there be question/concern in the interim.  Return in about 3 months (around 10/22/2019) for diabetic nail trim.  [name], DPM

## 2019-07-31 ENCOUNTER — Other Ambulatory Visit: Payer: Medicare HMO | Admitting: Orthotics

## 2019-08-04 DIAGNOSIS — R067 Sneezing: Secondary | ICD-10-CM | POA: Diagnosis not present

## 2019-08-04 DIAGNOSIS — R062 Wheezing: Secondary | ICD-10-CM | POA: Diagnosis not present

## 2019-08-04 DIAGNOSIS — R05 Cough: Secondary | ICD-10-CM | POA: Diagnosis not present

## 2019-08-14 DIAGNOSIS — E1140 Type 2 diabetes mellitus with diabetic neuropathy, unspecified: Secondary | ICD-10-CM | POA: Diagnosis not present

## 2019-08-18 DIAGNOSIS — E1140 Type 2 diabetes mellitus with diabetic neuropathy, unspecified: Secondary | ICD-10-CM | POA: Diagnosis not present

## 2019-08-18 DIAGNOSIS — E782 Mixed hyperlipidemia: Secondary | ICD-10-CM | POA: Diagnosis not present

## 2019-08-18 DIAGNOSIS — M170 Bilateral primary osteoarthritis of knee: Secondary | ICD-10-CM | POA: Diagnosis not present

## 2019-08-18 DIAGNOSIS — I10 Essential (primary) hypertension: Secondary | ICD-10-CM | POA: Diagnosis not present

## 2019-08-18 DIAGNOSIS — H409 Unspecified glaucoma: Secondary | ICD-10-CM | POA: Diagnosis not present

## 2019-08-18 DIAGNOSIS — N400 Enlarged prostate without lower urinary tract symptoms: Secondary | ICD-10-CM | POA: Diagnosis not present

## 2019-09-16 DIAGNOSIS — I10 Essential (primary) hypertension: Secondary | ICD-10-CM | POA: Diagnosis not present

## 2019-09-16 DIAGNOSIS — E1140 Type 2 diabetes mellitus with diabetic neuropathy, unspecified: Secondary | ICD-10-CM | POA: Diagnosis not present

## 2019-09-16 DIAGNOSIS — E785 Hyperlipidemia, unspecified: Secondary | ICD-10-CM | POA: Diagnosis not present

## 2019-11-16 ENCOUNTER — Ambulatory Visit: Payer: Medicare HMO | Admitting: Podiatry

## 2019-11-17 ENCOUNTER — Encounter: Payer: Self-pay | Admitting: Podiatry

## 2019-11-17 ENCOUNTER — Ambulatory Visit: Payer: Medicare HMO | Admitting: Podiatry

## 2019-11-17 DIAGNOSIS — M79674 Pain in right toe(s): Secondary | ICD-10-CM

## 2019-11-17 DIAGNOSIS — M79675 Pain in left toe(s): Secondary | ICD-10-CM | POA: Diagnosis not present

## 2019-11-17 DIAGNOSIS — B351 Tinea unguium: Secondary | ICD-10-CM

## 2019-11-17 DIAGNOSIS — E1142 Type 2 diabetes mellitus with diabetic polyneuropathy: Secondary | ICD-10-CM | POA: Diagnosis not present

## 2019-11-17 NOTE — Progress Notes (Signed)
This patient returns to my office for at risk foot care.  This patient requires this care by a professional since this patient will be at risk due to having This patient is unable to cut nails himself since the patient cannot reach his nails.These nails are painful walking and wearing shoes.  This patient presents for at risk foot care today.  General Appearance  Alert, conversant and in no acute stress.  Vascular  Dorsalis pedis and posterior tibial  pulses are palpable  bilaterally.  Capillary return is within normal limits  bilaterally. Temperature is within normal limits  bilaterally.  Neurologic  Senn-Weinstein monofilament wire test diminished   bilaterally. Muscle power within normal limits bilaterally.  Nails Thick disfigured discolored nails with subungual debris  from hallux to fifth toes bilaterally. No evidence of bacterial infection or drainage bilaterally.  Orthopedic  No limitations of motion  feet .  No crepitus or effusions noted.  No bony pathology or digital deformities noted.  Skin  normotropic skin with no porokeratosis noted bilaterally.  No signs of infections or ulcers noted.     Onychomycosis  Pain in right toes  Pain in left toes  Consent was obtained for treatment procedures.   Mechanical debridement of nails 1-5  bilaterally performed with a nail nipper.  Filed with dremel without incident.    Return office visit   3 months                   Told patient to return for periodic foot care and evaluation due to potential at risk complications.   Helane Gunther DPM

## 2019-11-30 DIAGNOSIS — E1140 Type 2 diabetes mellitus with diabetic neuropathy, unspecified: Secondary | ICD-10-CM | POA: Diagnosis not present

## 2019-11-30 DIAGNOSIS — E782 Mixed hyperlipidemia: Secondary | ICD-10-CM | POA: Diagnosis not present

## 2019-11-30 DIAGNOSIS — I10 Essential (primary) hypertension: Secondary | ICD-10-CM | POA: Diagnosis not present

## 2019-11-30 DIAGNOSIS — M170 Bilateral primary osteoarthritis of knee: Secondary | ICD-10-CM | POA: Diagnosis not present

## 2019-11-30 DIAGNOSIS — N400 Enlarged prostate without lower urinary tract symptoms: Secondary | ICD-10-CM | POA: Diagnosis not present

## 2019-11-30 DIAGNOSIS — H409 Unspecified glaucoma: Secondary | ICD-10-CM | POA: Diagnosis not present

## 2019-12-15 DIAGNOSIS — H401123 Primary open-angle glaucoma, left eye, severe stage: Secondary | ICD-10-CM | POA: Diagnosis not present

## 2019-12-15 DIAGNOSIS — H401113 Primary open-angle glaucoma, right eye, severe stage: Secondary | ICD-10-CM | POA: Diagnosis not present

## 2019-12-15 DIAGNOSIS — H401133 Primary open-angle glaucoma, bilateral, severe stage: Secondary | ICD-10-CM | POA: Diagnosis not present

## 2020-01-02 DIAGNOSIS — Z0100 Encounter for examination of eyes and vision without abnormal findings: Secondary | ICD-10-CM | POA: Diagnosis not present

## 2020-02-02 DIAGNOSIS — E782 Mixed hyperlipidemia: Secondary | ICD-10-CM | POA: Diagnosis not present

## 2020-02-02 DIAGNOSIS — Z23 Encounter for immunization: Secondary | ICD-10-CM | POA: Diagnosis not present

## 2020-02-02 DIAGNOSIS — N400 Enlarged prostate without lower urinary tract symptoms: Secondary | ICD-10-CM | POA: Diagnosis not present

## 2020-02-02 DIAGNOSIS — E1140 Type 2 diabetes mellitus with diabetic neuropathy, unspecified: Secondary | ICD-10-CM | POA: Diagnosis not present

## 2020-02-02 DIAGNOSIS — Z0000 Encounter for general adult medical examination without abnormal findings: Secondary | ICD-10-CM | POA: Diagnosis not present

## 2020-02-02 DIAGNOSIS — I10 Essential (primary) hypertension: Secondary | ICD-10-CM | POA: Diagnosis not present

## 2020-02-02 DIAGNOSIS — Z125 Encounter for screening for malignant neoplasm of prostate: Secondary | ICD-10-CM | POA: Diagnosis not present

## 2020-02-02 DIAGNOSIS — K219 Gastro-esophageal reflux disease without esophagitis: Secondary | ICD-10-CM | POA: Diagnosis not present

## 2020-02-02 DIAGNOSIS — D696 Thrombocytopenia, unspecified: Secondary | ICD-10-CM | POA: Diagnosis not present

## 2020-02-08 DIAGNOSIS — N400 Enlarged prostate without lower urinary tract symptoms: Secondary | ICD-10-CM | POA: Diagnosis not present

## 2020-02-08 DIAGNOSIS — E782 Mixed hyperlipidemia: Secondary | ICD-10-CM | POA: Diagnosis not present

## 2020-02-08 DIAGNOSIS — E1140 Type 2 diabetes mellitus with diabetic neuropathy, unspecified: Secondary | ICD-10-CM | POA: Diagnosis not present

## 2020-02-08 DIAGNOSIS — M170 Bilateral primary osteoarthritis of knee: Secondary | ICD-10-CM | POA: Diagnosis not present

## 2020-02-08 DIAGNOSIS — I10 Essential (primary) hypertension: Secondary | ICD-10-CM | POA: Diagnosis not present

## 2020-02-08 DIAGNOSIS — K219 Gastro-esophageal reflux disease without esophagitis: Secondary | ICD-10-CM | POA: Diagnosis not present

## 2020-02-08 DIAGNOSIS — H409 Unspecified glaucoma: Secondary | ICD-10-CM | POA: Diagnosis not present

## 2020-02-26 ENCOUNTER — Other Ambulatory Visit: Payer: Self-pay

## 2020-02-26 ENCOUNTER — Encounter: Payer: Self-pay | Admitting: Podiatry

## 2020-02-26 ENCOUNTER — Ambulatory Visit: Payer: Medicare HMO | Admitting: Podiatry

## 2020-02-26 DIAGNOSIS — E1142 Type 2 diabetes mellitus with diabetic polyneuropathy: Secondary | ICD-10-CM

## 2020-02-26 DIAGNOSIS — B351 Tinea unguium: Secondary | ICD-10-CM

## 2020-02-26 DIAGNOSIS — M79675 Pain in left toe(s): Secondary | ICD-10-CM | POA: Diagnosis not present

## 2020-02-26 DIAGNOSIS — M2142 Flat foot [pes planus] (acquired), left foot: Secondary | ICD-10-CM

## 2020-02-26 DIAGNOSIS — M79674 Pain in right toe(s): Secondary | ICD-10-CM | POA: Diagnosis not present

## 2020-02-26 DIAGNOSIS — M2141 Flat foot [pes planus] (acquired), right foot: Secondary | ICD-10-CM

## 2020-03-04 DIAGNOSIS — E782 Mixed hyperlipidemia: Secondary | ICD-10-CM | POA: Insufficient documentation

## 2020-03-04 DIAGNOSIS — E1142 Type 2 diabetes mellitus with diabetic polyneuropathy: Secondary | ICD-10-CM | POA: Insufficient documentation

## 2020-03-04 DIAGNOSIS — M1710 Unilateral primary osteoarthritis, unspecified knee: Secondary | ICD-10-CM | POA: Insufficient documentation

## 2020-03-04 DIAGNOSIS — H409 Unspecified glaucoma: Secondary | ICD-10-CM | POA: Insufficient documentation

## 2020-03-04 DIAGNOSIS — K219 Gastro-esophageal reflux disease without esophagitis: Secondary | ICD-10-CM | POA: Insufficient documentation

## 2020-03-04 DIAGNOSIS — D696 Thrombocytopenia, unspecified: Secondary | ICD-10-CM | POA: Insufficient documentation

## 2020-03-04 DIAGNOSIS — N400 Enlarged prostate without lower urinary tract symptoms: Secondary | ICD-10-CM | POA: Insufficient documentation

## 2020-03-04 DIAGNOSIS — I10 Essential (primary) hypertension: Secondary | ICD-10-CM | POA: Insufficient documentation

## 2020-03-04 DIAGNOSIS — M179 Osteoarthritis of knee, unspecified: Secondary | ICD-10-CM | POA: Insufficient documentation

## 2020-03-04 NOTE — Progress Notes (Signed)
Subjective: Michael Jensen presents today for follow up of preventative diabetic foot care and painful mycotic nails b/l that are difficult to trim. Pain interferes with ambulation. Aggravating factors include wearing enclosed shoe gear. Pain is relieved with periodic professional debridement.   PCP is Michael Maris, FNP. Last visit was 02/02/2020.  No Known Allergies   Objective: There were no vitals filed for this visit.  Pt is a pleasant 84 y.o. year old AA male in NAD. AAO x 3.   Vascular Examination:  Capillary fill time to digits <3 seconds b/l. Palpable DP pulses b/l. Palpable PT pulses b/l. Pedal hair sparse b/l. Skin temperature gradient within normal limits b/l. No edema noted b/l. No pain with calf compression b/l.  Dermatological Examination: Pedal skin with normal turgor, texture and tone bilaterally. No open wounds bilaterally. No interdigital macerations bilaterally. Toenails 1-5 b/l elongated, dystrophic, thickened, crumbly with subungual debris and tenderness to dorsal palpation.  Musculoskeletal: Normal muscle strength 5/5 to all lower extremity muscle groups bilaterally. No pain crepitus or joint limitation noted with ROM b/l. Pes planus deformity noted b/l.  Utilizes rollator for ambulation assistance.  Neurological: Protective sensation decreased with 10 gram monofilament b/l. Vibratory sensation decreased b/l.  Assessment: 1. Pain due to onychomycosis of toenails of both feet   2. Pes planus of both feet   3. Diabetic peripheral neuropathy associated with type 2 diabetes mellitus (HCC)    Plan: -No new findings. No new orders. -Continue diabetic foot care principles. Literature dispensed on today.  -Toenails 1-5 b/l were debrided in length and girth with sterile nail nippers and dremel without iatrogenic bleeding.  -Patient to continue soft, supportive shoe gear daily. -Patient to report any pedal injuries to medical professional immediately. -Patient will  schedule appointment with Michael Jensen or Michael Jensen for measurement of feet for diabetic shoes at his convenience. -Patient/POA to call should there be question/concern in the interim.  Return in about 3 months (around 05/26/2020).  [name], DPM

## 2020-04-25 DIAGNOSIS — M542 Cervicalgia: Secondary | ICD-10-CM | POA: Diagnosis not present

## 2020-04-25 DIAGNOSIS — M25511 Pain in right shoulder: Secondary | ICD-10-CM | POA: Diagnosis not present

## 2020-05-08 DIAGNOSIS — E1140 Type 2 diabetes mellitus with diabetic neuropathy, unspecified: Secondary | ICD-10-CM | POA: Diagnosis not present

## 2020-05-08 DIAGNOSIS — H409 Unspecified glaucoma: Secondary | ICD-10-CM | POA: Diagnosis not present

## 2020-05-08 DIAGNOSIS — K219 Gastro-esophageal reflux disease without esophagitis: Secondary | ICD-10-CM | POA: Diagnosis not present

## 2020-05-08 DIAGNOSIS — M170 Bilateral primary osteoarthritis of knee: Secondary | ICD-10-CM | POA: Diagnosis not present

## 2020-05-08 DIAGNOSIS — E782 Mixed hyperlipidemia: Secondary | ICD-10-CM | POA: Diagnosis not present

## 2020-05-08 DIAGNOSIS — I10 Essential (primary) hypertension: Secondary | ICD-10-CM | POA: Diagnosis not present

## 2020-05-08 DIAGNOSIS — N400 Enlarged prostate without lower urinary tract symptoms: Secondary | ICD-10-CM | POA: Diagnosis not present

## 2020-05-31 ENCOUNTER — Encounter: Payer: Self-pay | Admitting: Podiatry

## 2020-05-31 ENCOUNTER — Other Ambulatory Visit: Payer: Self-pay

## 2020-05-31 ENCOUNTER — Ambulatory Visit (INDEPENDENT_AMBULATORY_CARE_PROVIDER_SITE_OTHER): Payer: Medicare HMO | Admitting: Podiatry

## 2020-05-31 DIAGNOSIS — M79675 Pain in left toe(s): Secondary | ICD-10-CM

## 2020-05-31 DIAGNOSIS — M79674 Pain in right toe(s): Secondary | ICD-10-CM

## 2020-05-31 DIAGNOSIS — L600 Ingrowing nail: Secondary | ICD-10-CM | POA: Diagnosis not present

## 2020-05-31 DIAGNOSIS — E1142 Type 2 diabetes mellitus with diabetic polyneuropathy: Secondary | ICD-10-CM | POA: Diagnosis not present

## 2020-05-31 DIAGNOSIS — B351 Tinea unguium: Secondary | ICD-10-CM | POA: Diagnosis not present

## 2020-05-31 DIAGNOSIS — M2141 Flat foot [pes planus] (acquired), right foot: Secondary | ICD-10-CM

## 2020-05-31 NOTE — Patient Instructions (Addendum)
Apply Neosporin to right 3rd toe once daily for one week. Call if you have any problems.

## 2020-06-05 NOTE — Progress Notes (Signed)
Subjective: Michael Jensen presents today for follow up of preventative diabetic foot care and painful mycotic nails b/l that are difficult to trim. Pain interferes with ambulation. Aggravating factors include wearing enclosed shoe gear. Pain is relieved with periodic professional debridement.   PCP is Peri Maris, FNP. Last visit was 02/02/2020.  No Known Allergies   Objective: There were no vitals filed for this visit.  Pt is a pleasant 85 y.o. year old AA male in NAD. AAO x 3.   Vascular Examination:  Capillary fill time to digits <3 seconds b/l. Palpable DP pulses b/l. Palpable PT pulses b/l. Pedal hair sparse b/l. Skin temperature gradient within normal limits b/l. No edema noted b/l. No pain with calf compression b/l.  Dermatological Examination: Pedal skin with normal turgor, texture and tone bilaterally. No open wounds bilaterally. No interdigital macerations bilaterally. Toenails 1-5 b/l elongated, discolored, dystrophic, thickened, crumbly with subungual debris and tenderness to dorsal palpation. Incurvated nailplate lateral border(s) R 3rd toe.  Nail border hypertrophy minimal. There is tenderness to palpation. Sign(s) of infection: no clinical signs of infection noted on examination today..  Musculoskeletal: Normal muscle strength 5/5 to all lower extremity muscle groups bilaterally. No pain crepitus or joint limitation noted with ROM b/l. Pes planus deformity noted b/l.  Utilizes rollator for ambulation assistance.  Neurological: Protective sensation decreased with 10 gram monofilament b/l. Vibratory sensation decreased b/l.  Assessment: 1. Pain due to onychomycosis of toenails of both feet   2. Ingrown toenail without infection   3. Pes planus of both feet   4. Diabetic peripheral neuropathy associated with type 2 diabetes mellitus (HCC)    Plan: -Examined patient. -No new findings. No new orders. -Continue diabetic foot care principles. -Toenails 1-5 b/l were debrided  in length and girth with sterile nail nippers and dremel without iatrogenic bleeding.  -Offending nail border debrided and curretaged R 3rd toe utilizing sterile nail nipper and currette. Border(s) cleansed with alcohol and triple antibiotic ointment applied. Patient instructed to apply Neosporin to R 3rd toe once daily for 7 days. -Patient to report any pedal injuries to medical professional immediately. -Patient/POA to call should there be question/concern in the interim.  Return in about 3 months (around 08/31/2020).  [name], DPM

## 2020-06-14 DIAGNOSIS — H401133 Primary open-angle glaucoma, bilateral, severe stage: Secondary | ICD-10-CM | POA: Diagnosis not present

## 2020-06-16 DIAGNOSIS — K219 Gastro-esophageal reflux disease without esophagitis: Secondary | ICD-10-CM | POA: Diagnosis not present

## 2020-06-16 DIAGNOSIS — N400 Enlarged prostate without lower urinary tract symptoms: Secondary | ICD-10-CM | POA: Diagnosis not present

## 2020-06-16 DIAGNOSIS — M170 Bilateral primary osteoarthritis of knee: Secondary | ICD-10-CM | POA: Diagnosis not present

## 2020-06-16 DIAGNOSIS — E782 Mixed hyperlipidemia: Secondary | ICD-10-CM | POA: Diagnosis not present

## 2020-06-16 DIAGNOSIS — H409 Unspecified glaucoma: Secondary | ICD-10-CM | POA: Diagnosis not present

## 2020-06-16 DIAGNOSIS — E1140 Type 2 diabetes mellitus with diabetic neuropathy, unspecified: Secondary | ICD-10-CM | POA: Diagnosis not present

## 2020-06-16 DIAGNOSIS — I10 Essential (primary) hypertension: Secondary | ICD-10-CM | POA: Diagnosis not present

## 2020-07-06 DIAGNOSIS — M170 Bilateral primary osteoarthritis of knee: Secondary | ICD-10-CM | POA: Diagnosis not present

## 2020-07-06 DIAGNOSIS — E782 Mixed hyperlipidemia: Secondary | ICD-10-CM | POA: Diagnosis not present

## 2020-07-06 DIAGNOSIS — H409 Unspecified glaucoma: Secondary | ICD-10-CM | POA: Diagnosis not present

## 2020-07-06 DIAGNOSIS — N400 Enlarged prostate without lower urinary tract symptoms: Secondary | ICD-10-CM | POA: Diagnosis not present

## 2020-07-06 DIAGNOSIS — E1140 Type 2 diabetes mellitus with diabetic neuropathy, unspecified: Secondary | ICD-10-CM | POA: Diagnosis not present

## 2020-07-06 DIAGNOSIS — K219 Gastro-esophageal reflux disease without esophagitis: Secondary | ICD-10-CM | POA: Diagnosis not present

## 2020-07-06 DIAGNOSIS — I10 Essential (primary) hypertension: Secondary | ICD-10-CM | POA: Diagnosis not present

## 2020-07-15 DIAGNOSIS — E1140 Type 2 diabetes mellitus with diabetic neuropathy, unspecified: Secondary | ICD-10-CM | POA: Diagnosis not present

## 2020-08-03 DIAGNOSIS — E1140 Type 2 diabetes mellitus with diabetic neuropathy, unspecified: Secondary | ICD-10-CM | POA: Diagnosis not present

## 2020-08-03 DIAGNOSIS — I10 Essential (primary) hypertension: Secondary | ICD-10-CM | POA: Diagnosis not present

## 2020-08-16 DIAGNOSIS — E1140 Type 2 diabetes mellitus with diabetic neuropathy, unspecified: Secondary | ICD-10-CM | POA: Diagnosis not present

## 2020-08-30 ENCOUNTER — Other Ambulatory Visit: Payer: Self-pay

## 2020-08-30 ENCOUNTER — Ambulatory Visit: Payer: Medicare HMO | Admitting: Podiatry

## 2020-08-30 DIAGNOSIS — L600 Ingrowing nail: Secondary | ICD-10-CM

## 2020-08-30 DIAGNOSIS — M79675 Pain in left toe(s): Secondary | ICD-10-CM

## 2020-08-30 DIAGNOSIS — B351 Tinea unguium: Secondary | ICD-10-CM

## 2020-08-30 DIAGNOSIS — M79674 Pain in right toe(s): Secondary | ICD-10-CM | POA: Diagnosis not present

## 2020-08-30 DIAGNOSIS — E1142 Type 2 diabetes mellitus with diabetic polyneuropathy: Secondary | ICD-10-CM

## 2020-09-05 ENCOUNTER — Encounter: Payer: Self-pay | Admitting: Podiatry

## 2020-09-05 NOTE — Progress Notes (Signed)
  Subjective:  Patient ID: Michael Jensen, male    DOB: 09/01/1935,  MRN: [id number]  85 y.o. male presents with at risk foot care with history of diabetic neuropathy and painful thick toenails that are difficult to trim. Pain interferes with ambulation. Aggravating factors include wearing enclosed shoe gear. Pain is relieved with periodic professional debridement. He notes no new pedal problems on today's visit.  Patient's blood sugar was 133 mg/dl today.  PCP: Soundra Pilon, FNP and last visit was: 08/16/2020.  Review of Systems: Negative except as noted in the HPI.   No Known Allergies  Objective:  There were no vitals filed for this visit. Constitutional Patient is a pleasant 85 y.o. African American male in NAD. AAO x 3.  Vascular Capillary fill time to digits <3 seconds b/l lower extremities. Palpable pedal pulses b/l LE. Pedal hair sparse. Lower extremity skin temperature gradient within normal limits. No pain with calf compression b/l. No edema noted b/l lower extremities. No cyanosis or clubbing noted.  Neurologic Normal speech. Protective sensation decreased with 10 gram monofilament b/l.  Dermatologic Pedal skin with normal turgor, texture and tone bilaterally. No open wounds bilaterally. No interdigital macerations bilaterally. Toenails 1-5 b/l elongated, discolored, dystrophic, thickened, crumbly with subungual debris and tenderness to dorsal palpation. Incurvated nailplate lateral border(s) R hallux.  Nail border hypertrophy minimal. There is tenderness to palpation. There is a small amount of granulation tissue present R hallux lateral border. No purulence, no drainage.  Orthopedic: Normal muscle strength 5/5 to all lower extremity muscle groups bilaterally. No pain crepitus or joint limitation noted with ROM b/l. Pes planus deformity noted b/l.  Utilizes rollator for ambulation assistance.   No flowsheet data found.     Assessment:   1. Pain due to onychomycosis of toenails  of both feet   2. Ingrown toenail without infection   3. Diabetic peripheral neuropathy associated with type 2 diabetes mellitus (HCC)    Plan:  Patient was evaluated and treated and all questions answered.  Onychomycosis with pain -Nails palliatively debridement as below. -Educated on self-care  Procedure: Nail Debridement Rationale: Pain Type of Debridement: manual, sharp debridement. Instrumentation: Nail nipper, rotary burr. Number of Nails: 10  -Examined patient. -Patient to continue soft, supportive shoe gear daily. -Toenails 1-5 b/l were debrided in length and girth with sterile nail nippers and dremel without iatrogenic bleeding.  -Offending nail border debrided and curretaged R hallux utilizing sterile nail nipper and currette. Border(s) cleansed with alcohol and triple antibiotic ointment  applied. Patient's wife instructed to apply Neosporin to R hallux once daily until healed. -Patient to report any pedal injuries to medical professional immediately. -Patient/POA to call should there be question/concern in the interim.  Return in about 3 months (around 11/30/2020).  [name], DPM

## 2020-09-12 DIAGNOSIS — K219 Gastro-esophageal reflux disease without esophagitis: Secondary | ICD-10-CM | POA: Diagnosis not present

## 2020-09-12 DIAGNOSIS — E1140 Type 2 diabetes mellitus with diabetic neuropathy, unspecified: Secondary | ICD-10-CM | POA: Diagnosis not present

## 2020-09-12 DIAGNOSIS — I10 Essential (primary) hypertension: Secondary | ICD-10-CM | POA: Diagnosis not present

## 2020-09-12 DIAGNOSIS — M170 Bilateral primary osteoarthritis of knee: Secondary | ICD-10-CM | POA: Diagnosis not present

## 2020-09-12 DIAGNOSIS — H409 Unspecified glaucoma: Secondary | ICD-10-CM | POA: Diagnosis not present

## 2020-09-12 DIAGNOSIS — N400 Enlarged prostate without lower urinary tract symptoms: Secondary | ICD-10-CM | POA: Diagnosis not present

## 2020-09-12 DIAGNOSIS — E782 Mixed hyperlipidemia: Secondary | ICD-10-CM | POA: Diagnosis not present

## 2020-09-13 DIAGNOSIS — R296 Repeated falls: Secondary | ICD-10-CM | POA: Diagnosis not present

## 2020-09-13 DIAGNOSIS — R5381 Other malaise: Secondary | ICD-10-CM | POA: Diagnosis not present

## 2020-09-13 DIAGNOSIS — R2681 Unsteadiness on feet: Secondary | ICD-10-CM | POA: Diagnosis not present

## 2020-09-14 DIAGNOSIS — N400 Enlarged prostate without lower urinary tract symptoms: Secondary | ICD-10-CM | POA: Diagnosis not present

## 2020-09-14 DIAGNOSIS — I10 Essential (primary) hypertension: Secondary | ICD-10-CM | POA: Diagnosis not present

## 2020-09-14 DIAGNOSIS — Z87891 Personal history of nicotine dependence: Secondary | ICD-10-CM | POA: Diagnosis not present

## 2020-09-14 DIAGNOSIS — Z7984 Long term (current) use of oral hypoglycemic drugs: Secondary | ICD-10-CM | POA: Diagnosis not present

## 2020-09-14 DIAGNOSIS — H409 Unspecified glaucoma: Secondary | ICD-10-CM | POA: Diagnosis not present

## 2020-09-14 DIAGNOSIS — M170 Bilateral primary osteoarthritis of knee: Secondary | ICD-10-CM | POA: Diagnosis not present

## 2020-09-14 DIAGNOSIS — E1140 Type 2 diabetes mellitus with diabetic neuropathy, unspecified: Secondary | ICD-10-CM | POA: Diagnosis not present

## 2020-09-14 DIAGNOSIS — K219 Gastro-esophageal reflux disease without esophagitis: Secondary | ICD-10-CM | POA: Diagnosis not present

## 2020-09-14 DIAGNOSIS — E782 Mixed hyperlipidemia: Secondary | ICD-10-CM | POA: Diagnosis not present

## 2020-09-16 DIAGNOSIS — I10 Essential (primary) hypertension: Secondary | ICD-10-CM | POA: Diagnosis not present

## 2020-09-16 DIAGNOSIS — M170 Bilateral primary osteoarthritis of knee: Secondary | ICD-10-CM | POA: Diagnosis not present

## 2020-09-16 DIAGNOSIS — H409 Unspecified glaucoma: Secondary | ICD-10-CM | POA: Diagnosis not present

## 2020-09-16 DIAGNOSIS — Z87891 Personal history of nicotine dependence: Secondary | ICD-10-CM | POA: Diagnosis not present

## 2020-09-16 DIAGNOSIS — Z7984 Long term (current) use of oral hypoglycemic drugs: Secondary | ICD-10-CM | POA: Diagnosis not present

## 2020-09-16 DIAGNOSIS — K219 Gastro-esophageal reflux disease without esophagitis: Secondary | ICD-10-CM | POA: Diagnosis not present

## 2020-09-16 DIAGNOSIS — N400 Enlarged prostate without lower urinary tract symptoms: Secondary | ICD-10-CM | POA: Diagnosis not present

## 2020-09-16 DIAGNOSIS — E1140 Type 2 diabetes mellitus with diabetic neuropathy, unspecified: Secondary | ICD-10-CM | POA: Diagnosis not present

## 2020-09-16 DIAGNOSIS — E782 Mixed hyperlipidemia: Secondary | ICD-10-CM | POA: Diagnosis not present

## 2020-09-20 DIAGNOSIS — Z7984 Long term (current) use of oral hypoglycemic drugs: Secondary | ICD-10-CM | POA: Diagnosis not present

## 2020-09-20 DIAGNOSIS — H409 Unspecified glaucoma: Secondary | ICD-10-CM | POA: Diagnosis not present

## 2020-09-20 DIAGNOSIS — E782 Mixed hyperlipidemia: Secondary | ICD-10-CM | POA: Diagnosis not present

## 2020-09-20 DIAGNOSIS — E1140 Type 2 diabetes mellitus with diabetic neuropathy, unspecified: Secondary | ICD-10-CM | POA: Diagnosis not present

## 2020-09-20 DIAGNOSIS — K219 Gastro-esophageal reflux disease without esophagitis: Secondary | ICD-10-CM | POA: Diagnosis not present

## 2020-09-20 DIAGNOSIS — M170 Bilateral primary osteoarthritis of knee: Secondary | ICD-10-CM | POA: Diagnosis not present

## 2020-09-20 DIAGNOSIS — Z87891 Personal history of nicotine dependence: Secondary | ICD-10-CM | POA: Diagnosis not present

## 2020-09-20 DIAGNOSIS — I10 Essential (primary) hypertension: Secondary | ICD-10-CM | POA: Diagnosis not present

## 2020-09-20 DIAGNOSIS — N400 Enlarged prostate without lower urinary tract symptoms: Secondary | ICD-10-CM | POA: Diagnosis not present

## 2020-09-21 DIAGNOSIS — Z7984 Long term (current) use of oral hypoglycemic drugs: Secondary | ICD-10-CM | POA: Diagnosis not present

## 2020-09-21 DIAGNOSIS — I10 Essential (primary) hypertension: Secondary | ICD-10-CM | POA: Diagnosis not present

## 2020-09-21 DIAGNOSIS — E1140 Type 2 diabetes mellitus with diabetic neuropathy, unspecified: Secondary | ICD-10-CM | POA: Diagnosis not present

## 2020-09-21 DIAGNOSIS — E782 Mixed hyperlipidemia: Secondary | ICD-10-CM | POA: Diagnosis not present

## 2020-09-21 DIAGNOSIS — K219 Gastro-esophageal reflux disease without esophagitis: Secondary | ICD-10-CM | POA: Diagnosis not present

## 2020-09-21 DIAGNOSIS — Z87891 Personal history of nicotine dependence: Secondary | ICD-10-CM | POA: Diagnosis not present

## 2020-09-21 DIAGNOSIS — H409 Unspecified glaucoma: Secondary | ICD-10-CM | POA: Diagnosis not present

## 2020-09-21 DIAGNOSIS — N400 Enlarged prostate without lower urinary tract symptoms: Secondary | ICD-10-CM | POA: Diagnosis not present

## 2020-09-21 DIAGNOSIS — M170 Bilateral primary osteoarthritis of knee: Secondary | ICD-10-CM | POA: Diagnosis not present

## 2020-09-22 DIAGNOSIS — Z7984 Long term (current) use of oral hypoglycemic drugs: Secondary | ICD-10-CM | POA: Diagnosis not present

## 2020-09-22 DIAGNOSIS — I10 Essential (primary) hypertension: Secondary | ICD-10-CM | POA: Diagnosis not present

## 2020-09-22 DIAGNOSIS — E782 Mixed hyperlipidemia: Secondary | ICD-10-CM | POA: Diagnosis not present

## 2020-09-22 DIAGNOSIS — M170 Bilateral primary osteoarthritis of knee: Secondary | ICD-10-CM | POA: Diagnosis not present

## 2020-09-22 DIAGNOSIS — H409 Unspecified glaucoma: Secondary | ICD-10-CM | POA: Diagnosis not present

## 2020-09-22 DIAGNOSIS — N400 Enlarged prostate without lower urinary tract symptoms: Secondary | ICD-10-CM | POA: Diagnosis not present

## 2020-09-22 DIAGNOSIS — E1140 Type 2 diabetes mellitus with diabetic neuropathy, unspecified: Secondary | ICD-10-CM | POA: Diagnosis not present

## 2020-09-22 DIAGNOSIS — Z87891 Personal history of nicotine dependence: Secondary | ICD-10-CM | POA: Diagnosis not present

## 2020-09-22 DIAGNOSIS — K219 Gastro-esophageal reflux disease without esophagitis: Secondary | ICD-10-CM | POA: Diagnosis not present

## 2020-09-23 DIAGNOSIS — K219 Gastro-esophageal reflux disease without esophagitis: Secondary | ICD-10-CM | POA: Diagnosis not present

## 2020-09-23 DIAGNOSIS — E1140 Type 2 diabetes mellitus with diabetic neuropathy, unspecified: Secondary | ICD-10-CM | POA: Diagnosis not present

## 2020-09-23 DIAGNOSIS — E782 Mixed hyperlipidemia: Secondary | ICD-10-CM | POA: Diagnosis not present

## 2020-09-23 DIAGNOSIS — Z87891 Personal history of nicotine dependence: Secondary | ICD-10-CM | POA: Diagnosis not present

## 2020-09-23 DIAGNOSIS — H409 Unspecified glaucoma: Secondary | ICD-10-CM | POA: Diagnosis not present

## 2020-09-23 DIAGNOSIS — Z7984 Long term (current) use of oral hypoglycemic drugs: Secondary | ICD-10-CM | POA: Diagnosis not present

## 2020-09-23 DIAGNOSIS — N400 Enlarged prostate without lower urinary tract symptoms: Secondary | ICD-10-CM | POA: Diagnosis not present

## 2020-09-23 DIAGNOSIS — I10 Essential (primary) hypertension: Secondary | ICD-10-CM | POA: Diagnosis not present

## 2020-09-23 DIAGNOSIS — M170 Bilateral primary osteoarthritis of knee: Secondary | ICD-10-CM | POA: Diagnosis not present

## 2020-09-27 DIAGNOSIS — E782 Mixed hyperlipidemia: Secondary | ICD-10-CM | POA: Diagnosis not present

## 2020-09-27 DIAGNOSIS — H409 Unspecified glaucoma: Secondary | ICD-10-CM | POA: Diagnosis not present

## 2020-09-27 DIAGNOSIS — N400 Enlarged prostate without lower urinary tract symptoms: Secondary | ICD-10-CM | POA: Diagnosis not present

## 2020-09-27 DIAGNOSIS — Z7984 Long term (current) use of oral hypoglycemic drugs: Secondary | ICD-10-CM | POA: Diagnosis not present

## 2020-09-27 DIAGNOSIS — Z87891 Personal history of nicotine dependence: Secondary | ICD-10-CM | POA: Diagnosis not present

## 2020-09-27 DIAGNOSIS — I10 Essential (primary) hypertension: Secondary | ICD-10-CM | POA: Diagnosis not present

## 2020-09-27 DIAGNOSIS — M170 Bilateral primary osteoarthritis of knee: Secondary | ICD-10-CM | POA: Diagnosis not present

## 2020-09-27 DIAGNOSIS — K219 Gastro-esophageal reflux disease without esophagitis: Secondary | ICD-10-CM | POA: Diagnosis not present

## 2020-09-27 DIAGNOSIS — E1140 Type 2 diabetes mellitus with diabetic neuropathy, unspecified: Secondary | ICD-10-CM | POA: Diagnosis not present

## 2020-09-28 DIAGNOSIS — Z7984 Long term (current) use of oral hypoglycemic drugs: Secondary | ICD-10-CM | POA: Diagnosis not present

## 2020-09-28 DIAGNOSIS — H409 Unspecified glaucoma: Secondary | ICD-10-CM | POA: Diagnosis not present

## 2020-09-28 DIAGNOSIS — Z87891 Personal history of nicotine dependence: Secondary | ICD-10-CM | POA: Diagnosis not present

## 2020-09-28 DIAGNOSIS — K219 Gastro-esophageal reflux disease without esophagitis: Secondary | ICD-10-CM | POA: Diagnosis not present

## 2020-09-28 DIAGNOSIS — E782 Mixed hyperlipidemia: Secondary | ICD-10-CM | POA: Diagnosis not present

## 2020-09-28 DIAGNOSIS — I10 Essential (primary) hypertension: Secondary | ICD-10-CM | POA: Diagnosis not present

## 2020-09-28 DIAGNOSIS — N400 Enlarged prostate without lower urinary tract symptoms: Secondary | ICD-10-CM | POA: Diagnosis not present

## 2020-09-28 DIAGNOSIS — M170 Bilateral primary osteoarthritis of knee: Secondary | ICD-10-CM | POA: Diagnosis not present

## 2020-09-28 DIAGNOSIS — E1140 Type 2 diabetes mellitus with diabetic neuropathy, unspecified: Secondary | ICD-10-CM | POA: Diagnosis not present

## 2020-09-29 DIAGNOSIS — E1140 Type 2 diabetes mellitus with diabetic neuropathy, unspecified: Secondary | ICD-10-CM | POA: Diagnosis not present

## 2020-09-29 DIAGNOSIS — H409 Unspecified glaucoma: Secondary | ICD-10-CM | POA: Diagnosis not present

## 2020-09-29 DIAGNOSIS — N400 Enlarged prostate without lower urinary tract symptoms: Secondary | ICD-10-CM | POA: Diagnosis not present

## 2020-09-29 DIAGNOSIS — Z7984 Long term (current) use of oral hypoglycemic drugs: Secondary | ICD-10-CM | POA: Diagnosis not present

## 2020-09-29 DIAGNOSIS — E782 Mixed hyperlipidemia: Secondary | ICD-10-CM | POA: Diagnosis not present

## 2020-09-29 DIAGNOSIS — M170 Bilateral primary osteoarthritis of knee: Secondary | ICD-10-CM | POA: Diagnosis not present

## 2020-09-29 DIAGNOSIS — K219 Gastro-esophageal reflux disease without esophagitis: Secondary | ICD-10-CM | POA: Diagnosis not present

## 2020-09-29 DIAGNOSIS — I10 Essential (primary) hypertension: Secondary | ICD-10-CM | POA: Diagnosis not present

## 2020-09-29 DIAGNOSIS — Z87891 Personal history of nicotine dependence: Secondary | ICD-10-CM | POA: Diagnosis not present

## 2020-09-30 DIAGNOSIS — K219 Gastro-esophageal reflux disease without esophagitis: Secondary | ICD-10-CM | POA: Diagnosis not present

## 2020-09-30 DIAGNOSIS — H409 Unspecified glaucoma: Secondary | ICD-10-CM | POA: Diagnosis not present

## 2020-09-30 DIAGNOSIS — I10 Essential (primary) hypertension: Secondary | ICD-10-CM | POA: Diagnosis not present

## 2020-09-30 DIAGNOSIS — E1140 Type 2 diabetes mellitus with diabetic neuropathy, unspecified: Secondary | ICD-10-CM | POA: Diagnosis not present

## 2020-09-30 DIAGNOSIS — Z7984 Long term (current) use of oral hypoglycemic drugs: Secondary | ICD-10-CM | POA: Diagnosis not present

## 2020-09-30 DIAGNOSIS — Z87891 Personal history of nicotine dependence: Secondary | ICD-10-CM | POA: Diagnosis not present

## 2020-09-30 DIAGNOSIS — N400 Enlarged prostate without lower urinary tract symptoms: Secondary | ICD-10-CM | POA: Diagnosis not present

## 2020-09-30 DIAGNOSIS — M170 Bilateral primary osteoarthritis of knee: Secondary | ICD-10-CM | POA: Diagnosis not present

## 2020-09-30 DIAGNOSIS — E782 Mixed hyperlipidemia: Secondary | ICD-10-CM | POA: Diagnosis not present

## 2020-10-04 DIAGNOSIS — E782 Mixed hyperlipidemia: Secondary | ICD-10-CM | POA: Diagnosis not present

## 2020-10-04 DIAGNOSIS — N400 Enlarged prostate without lower urinary tract symptoms: Secondary | ICD-10-CM | POA: Diagnosis not present

## 2020-10-04 DIAGNOSIS — Z7984 Long term (current) use of oral hypoglycemic drugs: Secondary | ICD-10-CM | POA: Diagnosis not present

## 2020-10-04 DIAGNOSIS — H409 Unspecified glaucoma: Secondary | ICD-10-CM | POA: Diagnosis not present

## 2020-10-04 DIAGNOSIS — M170 Bilateral primary osteoarthritis of knee: Secondary | ICD-10-CM | POA: Diagnosis not present

## 2020-10-04 DIAGNOSIS — Z87891 Personal history of nicotine dependence: Secondary | ICD-10-CM | POA: Diagnosis not present

## 2020-10-04 DIAGNOSIS — E1140 Type 2 diabetes mellitus with diabetic neuropathy, unspecified: Secondary | ICD-10-CM | POA: Diagnosis not present

## 2020-10-04 DIAGNOSIS — I10 Essential (primary) hypertension: Secondary | ICD-10-CM | POA: Diagnosis not present

## 2020-10-04 DIAGNOSIS — K219 Gastro-esophageal reflux disease without esophagitis: Secondary | ICD-10-CM | POA: Diagnosis not present

## 2020-10-05 DIAGNOSIS — E782 Mixed hyperlipidemia: Secondary | ICD-10-CM | POA: Diagnosis not present

## 2020-10-05 DIAGNOSIS — H409 Unspecified glaucoma: Secondary | ICD-10-CM | POA: Diagnosis not present

## 2020-10-05 DIAGNOSIS — M170 Bilateral primary osteoarthritis of knee: Secondary | ICD-10-CM | POA: Diagnosis not present

## 2020-10-05 DIAGNOSIS — N400 Enlarged prostate without lower urinary tract symptoms: Secondary | ICD-10-CM | POA: Diagnosis not present

## 2020-10-05 DIAGNOSIS — Z87891 Personal history of nicotine dependence: Secondary | ICD-10-CM | POA: Diagnosis not present

## 2020-10-05 DIAGNOSIS — Z7984 Long term (current) use of oral hypoglycemic drugs: Secondary | ICD-10-CM | POA: Diagnosis not present

## 2020-10-05 DIAGNOSIS — K219 Gastro-esophageal reflux disease without esophagitis: Secondary | ICD-10-CM | POA: Diagnosis not present

## 2020-10-05 DIAGNOSIS — E1140 Type 2 diabetes mellitus with diabetic neuropathy, unspecified: Secondary | ICD-10-CM | POA: Diagnosis not present

## 2020-10-05 DIAGNOSIS — I10 Essential (primary) hypertension: Secondary | ICD-10-CM | POA: Diagnosis not present

## 2020-10-06 DIAGNOSIS — Z7984 Long term (current) use of oral hypoglycemic drugs: Secondary | ICD-10-CM | POA: Diagnosis not present

## 2020-10-06 DIAGNOSIS — H409 Unspecified glaucoma: Secondary | ICD-10-CM | POA: Diagnosis not present

## 2020-10-06 DIAGNOSIS — K219 Gastro-esophageal reflux disease without esophagitis: Secondary | ICD-10-CM | POA: Diagnosis not present

## 2020-10-06 DIAGNOSIS — Z87891 Personal history of nicotine dependence: Secondary | ICD-10-CM | POA: Diagnosis not present

## 2020-10-06 DIAGNOSIS — I10 Essential (primary) hypertension: Secondary | ICD-10-CM | POA: Diagnosis not present

## 2020-10-06 DIAGNOSIS — E782 Mixed hyperlipidemia: Secondary | ICD-10-CM | POA: Diagnosis not present

## 2020-10-06 DIAGNOSIS — N400 Enlarged prostate without lower urinary tract symptoms: Secondary | ICD-10-CM | POA: Diagnosis not present

## 2020-10-06 DIAGNOSIS — M170 Bilateral primary osteoarthritis of knee: Secondary | ICD-10-CM | POA: Diagnosis not present

## 2020-10-06 DIAGNOSIS — E1140 Type 2 diabetes mellitus with diabetic neuropathy, unspecified: Secondary | ICD-10-CM | POA: Diagnosis not present

## 2020-10-07 DIAGNOSIS — E782 Mixed hyperlipidemia: Secondary | ICD-10-CM | POA: Diagnosis not present

## 2020-10-07 DIAGNOSIS — E1140 Type 2 diabetes mellitus with diabetic neuropathy, unspecified: Secondary | ICD-10-CM | POA: Diagnosis not present

## 2020-10-07 DIAGNOSIS — I10 Essential (primary) hypertension: Secondary | ICD-10-CM | POA: Diagnosis not present

## 2020-10-07 DIAGNOSIS — Z7984 Long term (current) use of oral hypoglycemic drugs: Secondary | ICD-10-CM | POA: Diagnosis not present

## 2020-10-07 DIAGNOSIS — N400 Enlarged prostate without lower urinary tract symptoms: Secondary | ICD-10-CM | POA: Diagnosis not present

## 2020-10-07 DIAGNOSIS — K219 Gastro-esophageal reflux disease without esophagitis: Secondary | ICD-10-CM | POA: Diagnosis not present

## 2020-10-07 DIAGNOSIS — H409 Unspecified glaucoma: Secondary | ICD-10-CM | POA: Diagnosis not present

## 2020-10-07 DIAGNOSIS — M170 Bilateral primary osteoarthritis of knee: Secondary | ICD-10-CM | POA: Diagnosis not present

## 2020-10-07 DIAGNOSIS — Z87891 Personal history of nicotine dependence: Secondary | ICD-10-CM | POA: Diagnosis not present

## 2020-10-11 DIAGNOSIS — Z87891 Personal history of nicotine dependence: Secondary | ICD-10-CM | POA: Diagnosis not present

## 2020-10-11 DIAGNOSIS — N400 Enlarged prostate without lower urinary tract symptoms: Secondary | ICD-10-CM | POA: Diagnosis not present

## 2020-10-11 DIAGNOSIS — I10 Essential (primary) hypertension: Secondary | ICD-10-CM | POA: Diagnosis not present

## 2020-10-11 DIAGNOSIS — Z7984 Long term (current) use of oral hypoglycemic drugs: Secondary | ICD-10-CM | POA: Diagnosis not present

## 2020-10-11 DIAGNOSIS — M170 Bilateral primary osteoarthritis of knee: Secondary | ICD-10-CM | POA: Diagnosis not present

## 2020-10-11 DIAGNOSIS — E1140 Type 2 diabetes mellitus with diabetic neuropathy, unspecified: Secondary | ICD-10-CM | POA: Diagnosis not present

## 2020-10-11 DIAGNOSIS — E782 Mixed hyperlipidemia: Secondary | ICD-10-CM | POA: Diagnosis not present

## 2020-10-11 DIAGNOSIS — H409 Unspecified glaucoma: Secondary | ICD-10-CM | POA: Diagnosis not present

## 2020-10-11 DIAGNOSIS — K219 Gastro-esophageal reflux disease without esophagitis: Secondary | ICD-10-CM | POA: Diagnosis not present

## 2020-10-12 DIAGNOSIS — N400 Enlarged prostate without lower urinary tract symptoms: Secondary | ICD-10-CM | POA: Diagnosis not present

## 2020-10-12 DIAGNOSIS — I10 Essential (primary) hypertension: Secondary | ICD-10-CM | POA: Diagnosis not present

## 2020-10-12 DIAGNOSIS — K219 Gastro-esophageal reflux disease without esophagitis: Secondary | ICD-10-CM | POA: Diagnosis not present

## 2020-10-12 DIAGNOSIS — Z7984 Long term (current) use of oral hypoglycemic drugs: Secondary | ICD-10-CM | POA: Diagnosis not present

## 2020-10-12 DIAGNOSIS — Z87891 Personal history of nicotine dependence: Secondary | ICD-10-CM | POA: Diagnosis not present

## 2020-10-12 DIAGNOSIS — E1140 Type 2 diabetes mellitus with diabetic neuropathy, unspecified: Secondary | ICD-10-CM | POA: Diagnosis not present

## 2020-10-12 DIAGNOSIS — M170 Bilateral primary osteoarthritis of knee: Secondary | ICD-10-CM | POA: Diagnosis not present

## 2020-10-12 DIAGNOSIS — E782 Mixed hyperlipidemia: Secondary | ICD-10-CM | POA: Diagnosis not present

## 2020-10-12 DIAGNOSIS — H409 Unspecified glaucoma: Secondary | ICD-10-CM | POA: Diagnosis not present

## 2020-10-13 DIAGNOSIS — Z7984 Long term (current) use of oral hypoglycemic drugs: Secondary | ICD-10-CM | POA: Diagnosis not present

## 2020-10-13 DIAGNOSIS — N400 Enlarged prostate without lower urinary tract symptoms: Secondary | ICD-10-CM | POA: Diagnosis not present

## 2020-10-13 DIAGNOSIS — E782 Mixed hyperlipidemia: Secondary | ICD-10-CM | POA: Diagnosis not present

## 2020-10-13 DIAGNOSIS — K219 Gastro-esophageal reflux disease without esophagitis: Secondary | ICD-10-CM | POA: Diagnosis not present

## 2020-10-13 DIAGNOSIS — I10 Essential (primary) hypertension: Secondary | ICD-10-CM | POA: Diagnosis not present

## 2020-10-13 DIAGNOSIS — H409 Unspecified glaucoma: Secondary | ICD-10-CM | POA: Diagnosis not present

## 2020-10-13 DIAGNOSIS — M170 Bilateral primary osteoarthritis of knee: Secondary | ICD-10-CM | POA: Diagnosis not present

## 2020-10-13 DIAGNOSIS — Z87891 Personal history of nicotine dependence: Secondary | ICD-10-CM | POA: Diagnosis not present

## 2020-10-13 DIAGNOSIS — E1140 Type 2 diabetes mellitus with diabetic neuropathy, unspecified: Secondary | ICD-10-CM | POA: Diagnosis not present

## 2020-10-25 DIAGNOSIS — Z7984 Long term (current) use of oral hypoglycemic drugs: Secondary | ICD-10-CM | POA: Diagnosis not present

## 2020-10-25 DIAGNOSIS — N400 Enlarged prostate without lower urinary tract symptoms: Secondary | ICD-10-CM | POA: Diagnosis not present

## 2020-10-25 DIAGNOSIS — Z87891 Personal history of nicotine dependence: Secondary | ICD-10-CM | POA: Diagnosis not present

## 2020-10-25 DIAGNOSIS — K219 Gastro-esophageal reflux disease without esophagitis: Secondary | ICD-10-CM | POA: Diagnosis not present

## 2020-10-25 DIAGNOSIS — E782 Mixed hyperlipidemia: Secondary | ICD-10-CM | POA: Diagnosis not present

## 2020-10-25 DIAGNOSIS — E1140 Type 2 diabetes mellitus with diabetic neuropathy, unspecified: Secondary | ICD-10-CM | POA: Diagnosis not present

## 2020-10-25 DIAGNOSIS — M170 Bilateral primary osteoarthritis of knee: Secondary | ICD-10-CM | POA: Diagnosis not present

## 2020-10-25 DIAGNOSIS — I10 Essential (primary) hypertension: Secondary | ICD-10-CM | POA: Diagnosis not present

## 2020-10-25 DIAGNOSIS — H409 Unspecified glaucoma: Secondary | ICD-10-CM | POA: Diagnosis not present

## 2020-10-27 DIAGNOSIS — N400 Enlarged prostate without lower urinary tract symptoms: Secondary | ICD-10-CM | POA: Diagnosis not present

## 2020-10-27 DIAGNOSIS — I10 Essential (primary) hypertension: Secondary | ICD-10-CM | POA: Diagnosis not present

## 2020-10-27 DIAGNOSIS — K219 Gastro-esophageal reflux disease without esophagitis: Secondary | ICD-10-CM | POA: Diagnosis not present

## 2020-10-27 DIAGNOSIS — M170 Bilateral primary osteoarthritis of knee: Secondary | ICD-10-CM | POA: Diagnosis not present

## 2020-10-27 DIAGNOSIS — Z87891 Personal history of nicotine dependence: Secondary | ICD-10-CM | POA: Diagnosis not present

## 2020-10-27 DIAGNOSIS — Z7984 Long term (current) use of oral hypoglycemic drugs: Secondary | ICD-10-CM | POA: Diagnosis not present

## 2020-10-27 DIAGNOSIS — H409 Unspecified glaucoma: Secondary | ICD-10-CM | POA: Diagnosis not present

## 2020-10-27 DIAGNOSIS — E1140 Type 2 diabetes mellitus with diabetic neuropathy, unspecified: Secondary | ICD-10-CM | POA: Diagnosis not present

## 2020-10-27 DIAGNOSIS — E782 Mixed hyperlipidemia: Secondary | ICD-10-CM | POA: Diagnosis not present

## 2020-11-01 DIAGNOSIS — R296 Repeated falls: Secondary | ICD-10-CM | POA: Diagnosis not present

## 2020-11-01 DIAGNOSIS — R2681 Unsteadiness on feet: Secondary | ICD-10-CM | POA: Diagnosis not present

## 2020-11-02 DIAGNOSIS — Z87891 Personal history of nicotine dependence: Secondary | ICD-10-CM | POA: Diagnosis not present

## 2020-11-02 DIAGNOSIS — N400 Enlarged prostate without lower urinary tract symptoms: Secondary | ICD-10-CM | POA: Diagnosis not present

## 2020-11-02 DIAGNOSIS — E1140 Type 2 diabetes mellitus with diabetic neuropathy, unspecified: Secondary | ICD-10-CM | POA: Diagnosis not present

## 2020-11-02 DIAGNOSIS — H409 Unspecified glaucoma: Secondary | ICD-10-CM | POA: Diagnosis not present

## 2020-11-02 DIAGNOSIS — K219 Gastro-esophageal reflux disease without esophagitis: Secondary | ICD-10-CM | POA: Diagnosis not present

## 2020-11-02 DIAGNOSIS — M170 Bilateral primary osteoarthritis of knee: Secondary | ICD-10-CM | POA: Diagnosis not present

## 2020-11-02 DIAGNOSIS — Z7984 Long term (current) use of oral hypoglycemic drugs: Secondary | ICD-10-CM | POA: Diagnosis not present

## 2020-11-02 DIAGNOSIS — I10 Essential (primary) hypertension: Secondary | ICD-10-CM | POA: Diagnosis not present

## 2020-11-02 DIAGNOSIS — E782 Mixed hyperlipidemia: Secondary | ICD-10-CM | POA: Diagnosis not present

## 2020-11-09 DIAGNOSIS — I10 Essential (primary) hypertension: Secondary | ICD-10-CM | POA: Diagnosis not present

## 2020-11-09 DIAGNOSIS — M170 Bilateral primary osteoarthritis of knee: Secondary | ICD-10-CM | POA: Diagnosis not present

## 2020-11-09 DIAGNOSIS — K219 Gastro-esophageal reflux disease without esophagitis: Secondary | ICD-10-CM | POA: Diagnosis not present

## 2020-11-09 DIAGNOSIS — E782 Mixed hyperlipidemia: Secondary | ICD-10-CM | POA: Diagnosis not present

## 2020-11-09 DIAGNOSIS — Z7984 Long term (current) use of oral hypoglycemic drugs: Secondary | ICD-10-CM | POA: Diagnosis not present

## 2020-11-09 DIAGNOSIS — E1140 Type 2 diabetes mellitus with diabetic neuropathy, unspecified: Secondary | ICD-10-CM | POA: Diagnosis not present

## 2020-11-09 DIAGNOSIS — Z87891 Personal history of nicotine dependence: Secondary | ICD-10-CM | POA: Diagnosis not present

## 2020-11-09 DIAGNOSIS — N400 Enlarged prostate without lower urinary tract symptoms: Secondary | ICD-10-CM | POA: Diagnosis not present

## 2020-11-09 DIAGNOSIS — H409 Unspecified glaucoma: Secondary | ICD-10-CM | POA: Diagnosis not present

## 2020-11-15 DIAGNOSIS — E1140 Type 2 diabetes mellitus with diabetic neuropathy, unspecified: Secondary | ICD-10-CM | POA: Diagnosis not present

## 2020-11-16 DIAGNOSIS — N400 Enlarged prostate without lower urinary tract symptoms: Secondary | ICD-10-CM | POA: Diagnosis not present

## 2020-11-16 DIAGNOSIS — I10 Essential (primary) hypertension: Secondary | ICD-10-CM | POA: Diagnosis not present

## 2020-11-16 DIAGNOSIS — Z87891 Personal history of nicotine dependence: Secondary | ICD-10-CM | POA: Diagnosis not present

## 2020-11-16 DIAGNOSIS — E1140 Type 2 diabetes mellitus with diabetic neuropathy, unspecified: Secondary | ICD-10-CM | POA: Diagnosis not present

## 2020-11-16 DIAGNOSIS — E782 Mixed hyperlipidemia: Secondary | ICD-10-CM | POA: Diagnosis not present

## 2020-11-16 DIAGNOSIS — M170 Bilateral primary osteoarthritis of knee: Secondary | ICD-10-CM | POA: Diagnosis not present

## 2020-11-16 DIAGNOSIS — K219 Gastro-esophageal reflux disease without esophagitis: Secondary | ICD-10-CM | POA: Diagnosis not present

## 2020-11-16 DIAGNOSIS — H409 Unspecified glaucoma: Secondary | ICD-10-CM | POA: Diagnosis not present

## 2020-11-16 DIAGNOSIS — Z7984 Long term (current) use of oral hypoglycemic drugs: Secondary | ICD-10-CM | POA: Diagnosis not present

## 2020-11-18 DIAGNOSIS — K219 Gastro-esophageal reflux disease without esophagitis: Secondary | ICD-10-CM | POA: Diagnosis not present

## 2020-11-18 DIAGNOSIS — E1140 Type 2 diabetes mellitus with diabetic neuropathy, unspecified: Secondary | ICD-10-CM | POA: Diagnosis not present

## 2020-11-18 DIAGNOSIS — H409 Unspecified glaucoma: Secondary | ICD-10-CM | POA: Diagnosis not present

## 2020-11-18 DIAGNOSIS — M170 Bilateral primary osteoarthritis of knee: Secondary | ICD-10-CM | POA: Diagnosis not present

## 2020-11-18 DIAGNOSIS — N400 Enlarged prostate without lower urinary tract symptoms: Secondary | ICD-10-CM | POA: Diagnosis not present

## 2020-11-18 DIAGNOSIS — Z87891 Personal history of nicotine dependence: Secondary | ICD-10-CM | POA: Diagnosis not present

## 2020-11-18 DIAGNOSIS — I10 Essential (primary) hypertension: Secondary | ICD-10-CM | POA: Diagnosis not present

## 2020-11-18 DIAGNOSIS — E782 Mixed hyperlipidemia: Secondary | ICD-10-CM | POA: Diagnosis not present

## 2020-11-18 DIAGNOSIS — Z7984 Long term (current) use of oral hypoglycemic drugs: Secondary | ICD-10-CM | POA: Diagnosis not present

## 2020-11-22 DIAGNOSIS — R296 Repeated falls: Secondary | ICD-10-CM | POA: Diagnosis not present

## 2020-11-22 DIAGNOSIS — R2681 Unsteadiness on feet: Secondary | ICD-10-CM | POA: Diagnosis not present

## 2020-11-23 DIAGNOSIS — M170 Bilateral primary osteoarthritis of knee: Secondary | ICD-10-CM | POA: Diagnosis not present

## 2020-11-23 DIAGNOSIS — H409 Unspecified glaucoma: Secondary | ICD-10-CM | POA: Diagnosis not present

## 2020-11-23 DIAGNOSIS — Z87891 Personal history of nicotine dependence: Secondary | ICD-10-CM | POA: Diagnosis not present

## 2020-11-23 DIAGNOSIS — K219 Gastro-esophageal reflux disease without esophagitis: Secondary | ICD-10-CM | POA: Diagnosis not present

## 2020-11-23 DIAGNOSIS — E782 Mixed hyperlipidemia: Secondary | ICD-10-CM | POA: Diagnosis not present

## 2020-11-23 DIAGNOSIS — I10 Essential (primary) hypertension: Secondary | ICD-10-CM | POA: Diagnosis not present

## 2020-11-23 DIAGNOSIS — N400 Enlarged prostate without lower urinary tract symptoms: Secondary | ICD-10-CM | POA: Diagnosis not present

## 2020-11-23 DIAGNOSIS — Z7984 Long term (current) use of oral hypoglycemic drugs: Secondary | ICD-10-CM | POA: Diagnosis not present

## 2020-11-23 DIAGNOSIS — E1140 Type 2 diabetes mellitus with diabetic neuropathy, unspecified: Secondary | ICD-10-CM | POA: Diagnosis not present

## 2020-11-25 ENCOUNTER — Inpatient Hospital Stay (HOSPITAL_COMMUNITY): Payer: Medicare HMO

## 2020-11-25 ENCOUNTER — Emergency Department (HOSPITAL_COMMUNITY): Payer: Medicare HMO

## 2020-11-25 ENCOUNTER — Other Ambulatory Visit: Payer: Self-pay

## 2020-11-25 ENCOUNTER — Encounter (HOSPITAL_COMMUNITY): Payer: Self-pay

## 2020-11-25 ENCOUNTER — Inpatient Hospital Stay (HOSPITAL_COMMUNITY)
Admission: EM | Admit: 2020-11-25 | Discharge: 2020-12-01 | DRG: 472 | Disposition: A | Discharge status: Skilled Nursing Facility | Payer: Medicare HMO | Attending: Internal Medicine | Admitting: Internal Medicine

## 2020-11-25 DIAGNOSIS — Z79899 Other long term (current) drug therapy: Secondary | ICD-10-CM | POA: Diagnosis not present

## 2020-11-25 DIAGNOSIS — M4322 Fusion of spine, cervical region: Secondary | ICD-10-CM | POA: Diagnosis not present

## 2020-11-25 DIAGNOSIS — N401 Enlarged prostate with lower urinary tract symptoms: Secondary | ICD-10-CM | POA: Diagnosis present

## 2020-11-25 DIAGNOSIS — Z20822 Contact with and (suspected) exposure to covid-19: Secondary | ICD-10-CM | POA: Diagnosis not present

## 2020-11-25 DIAGNOSIS — Z7984 Long term (current) use of oral hypoglycemic drugs: Secondary | ICD-10-CM | POA: Diagnosis not present

## 2020-11-25 DIAGNOSIS — M1990 Unspecified osteoarthritis, unspecified site: Secondary | ICD-10-CM | POA: Diagnosis present

## 2020-11-25 DIAGNOSIS — E11319 Type 2 diabetes mellitus with unspecified diabetic retinopathy without macular edema: Secondary | ICD-10-CM | POA: Diagnosis not present

## 2020-11-25 DIAGNOSIS — H548 Legal blindness, as defined in USA: Secondary | ICD-10-CM | POA: Diagnosis present

## 2020-11-25 DIAGNOSIS — N3001 Acute cystitis with hematuria: Secondary | ICD-10-CM | POA: Diagnosis not present

## 2020-11-25 DIAGNOSIS — E1142 Type 2 diabetes mellitus with diabetic polyneuropathy: Secondary | ICD-10-CM | POA: Diagnosis present

## 2020-11-25 DIAGNOSIS — Z7982 Long term (current) use of aspirin: Secondary | ICD-10-CM

## 2020-11-25 DIAGNOSIS — M4699 Unspecified inflammatory spondylopathy, multiple sites in spine: Secondary | ICD-10-CM | POA: Diagnosis not present

## 2020-11-25 DIAGNOSIS — I10 Essential (primary) hypertension: Secondary | ICD-10-CM | POA: Diagnosis not present

## 2020-11-25 DIAGNOSIS — Z419 Encounter for procedure for purposes other than remedying health state, unspecified: Secondary | ICD-10-CM

## 2020-11-25 DIAGNOSIS — R300 Dysuria: Secondary | ICD-10-CM | POA: Diagnosis not present

## 2020-11-25 DIAGNOSIS — M48061 Spinal stenosis, lumbar region without neurogenic claudication: Secondary | ICD-10-CM | POA: Diagnosis present

## 2020-11-25 DIAGNOSIS — M6281 Muscle weakness (generalized): Secondary | ICD-10-CM

## 2020-11-25 DIAGNOSIS — M4804 Spinal stenosis, thoracic region: Secondary | ICD-10-CM | POA: Diagnosis not present

## 2020-11-25 DIAGNOSIS — M2578 Osteophyte, vertebrae: Secondary | ICD-10-CM | POA: Diagnosis not present

## 2020-11-25 DIAGNOSIS — M47816 Spondylosis without myelopathy or radiculopathy, lumbar region: Secondary | ICD-10-CM | POA: Diagnosis not present

## 2020-11-25 DIAGNOSIS — M4802 Spinal stenosis, cervical region: Secondary | ICD-10-CM | POA: Diagnosis not present

## 2020-11-25 DIAGNOSIS — M4803 Spinal stenosis, cervicothoracic region: Secondary | ICD-10-CM | POA: Diagnosis not present

## 2020-11-25 DIAGNOSIS — M4716 Other spondylosis with myelopathy, lumbar region: Secondary | ICD-10-CM | POA: Diagnosis not present

## 2020-11-25 DIAGNOSIS — N400 Enlarged prostate without lower urinary tract symptoms: Secondary | ICD-10-CM | POA: Diagnosis present

## 2020-11-25 DIAGNOSIS — R109 Unspecified abdominal pain: Secondary | ICD-10-CM | POA: Diagnosis not present

## 2020-11-25 DIAGNOSIS — G959 Disease of spinal cord, unspecified: Secondary | ICD-10-CM | POA: Diagnosis not present

## 2020-11-25 DIAGNOSIS — R339 Retention of urine, unspecified: Secondary | ICD-10-CM | POA: Diagnosis not present

## 2020-11-25 DIAGNOSIS — Z7401 Bed confinement status: Secondary | ICD-10-CM | POA: Diagnosis not present

## 2020-11-25 DIAGNOSIS — K219 Gastro-esophageal reflux disease without esophagitis: Secondary | ICD-10-CM | POA: Diagnosis present

## 2020-11-25 DIAGNOSIS — R531 Weakness: Secondary | ICD-10-CM | POA: Diagnosis not present

## 2020-11-25 DIAGNOSIS — R609 Edema, unspecified: Secondary | ICD-10-CM | POA: Diagnosis not present

## 2020-11-25 DIAGNOSIS — R338 Other retention of urine: Secondary | ICD-10-CM | POA: Diagnosis present

## 2020-11-25 DIAGNOSIS — M47813 Spondylosis without myelopathy or radiculopathy, cervicothoracic region: Secondary | ICD-10-CM | POA: Diagnosis not present

## 2020-11-25 DIAGNOSIS — G992 Myelopathy in diseases classified elsewhere: Secondary | ICD-10-CM | POA: Diagnosis not present

## 2020-11-25 DIAGNOSIS — M4807 Spinal stenosis, lumbosacral region: Secondary | ICD-10-CM | POA: Diagnosis present

## 2020-11-25 DIAGNOSIS — E785 Hyperlipidemia, unspecified: Secondary | ICD-10-CM | POA: Diagnosis present

## 2020-11-25 DIAGNOSIS — Z01818 Encounter for other preprocedural examination: Secondary | ICD-10-CM | POA: Diagnosis not present

## 2020-11-25 DIAGNOSIS — M4319 Spondylolisthesis, multiple sites in spine: Secondary | ICD-10-CM | POA: Diagnosis not present

## 2020-11-25 DIAGNOSIS — G1220 Motor neuron disease, unspecified: Secondary | ICD-10-CM | POA: Diagnosis not present

## 2020-11-25 DIAGNOSIS — M5124 Other intervertebral disc displacement, thoracic region: Secondary | ICD-10-CM | POA: Diagnosis not present

## 2020-11-25 DIAGNOSIS — R1030 Lower abdominal pain, unspecified: Secondary | ICD-10-CM | POA: Diagnosis present

## 2020-11-25 DIAGNOSIS — M4805 Spinal stenosis, thoracolumbar region: Secondary | ICD-10-CM | POA: Diagnosis not present

## 2020-11-25 DIAGNOSIS — H409 Unspecified glaucoma: Secondary | ICD-10-CM | POA: Diagnosis present

## 2020-11-25 DIAGNOSIS — R5383 Other fatigue: Secondary | ICD-10-CM | POA: Diagnosis not present

## 2020-11-25 DIAGNOSIS — Z87891 Personal history of nicotine dependence: Secondary | ICD-10-CM | POA: Diagnosis not present

## 2020-11-25 DIAGNOSIS — R0902 Hypoxemia: Secondary | ICD-10-CM | POA: Diagnosis not present

## 2020-11-25 DIAGNOSIS — K5900 Constipation, unspecified: Secondary | ICD-10-CM | POA: Diagnosis not present

## 2020-11-25 DIAGNOSIS — N390 Urinary tract infection, site not specified: Secondary | ICD-10-CM | POA: Diagnosis present

## 2020-11-25 DIAGNOSIS — E782 Mixed hyperlipidemia: Secondary | ICD-10-CM | POA: Diagnosis not present

## 2020-11-25 DIAGNOSIS — Z981 Arthrodesis status: Secondary | ICD-10-CM | POA: Diagnosis not present

## 2020-11-25 LAB — BASIC METABOLIC PANEL
Anion gap: 9 (ref 5–15)
BUN: 21 mg/dL (ref 8–23)
CO2: 24 mmol/L (ref 22–32)
Calcium: 9.2 mg/dL (ref 8.9–10.3)
Chloride: 104 mmol/L (ref 98–111)
Creatinine, Ser: 0.9 mg/dL (ref 0.61–1.24)
GFR, Estimated: >60 mL/min (ref >60)
Glucose, Bld: 166 mg/dL — ABNORMAL HIGH (ref 70–99)
Potassium: 3.9 mmol/L (ref 3.5–5.1)
Sodium: 137 mmol/L (ref 135–145)

## 2020-11-25 LAB — CBG MONITORING, ED
Glucose-Capillary: 144 mg/dL — ABNORMAL HIGH (ref 70–99)
Glucose-Capillary: 217 mg/dL — ABNORMAL HIGH (ref 70–99)
Glucose-Capillary: 168 mg/dL — ABNORMAL HIGH (ref 70–99)

## 2020-11-25 LAB — CBC WITH DIFFERENTIAL/PLATELET
Abs Immature Granulocytes: 0.02 10*3/uL (ref 0.00–0.07)
Basophils Absolute: 0 10*3/uL (ref 0.0–0.1)
Basophils Relative: 0 %
Eosinophils Absolute: 0 10*3/uL (ref 0.0–0.5)
Eosinophils Relative: 0 %
HCT: 41.7 % (ref 39.0–52.0)
Hemoglobin: 13.7 g/dL (ref 13.0–17.0)
Immature Granulocytes: 0 %
Lymphocytes Relative: 14 %
Lymphs Abs: 1 10*3/uL (ref 0.7–4.0)
MCH: 27.2 pg (ref 26.0–34.0)
MCHC: 32.9 g/dL (ref 30.0–36.0)
MCV: 82.9 fL (ref 80.0–100.0)
Monocytes Absolute: 0.4 10*3/uL (ref 0.1–1.0)
Monocytes Relative: 6 %
Neutro Abs: 5.6 10*3/uL (ref 1.7–7.7)
Neutrophils Relative %: 80 %
Platelets: 130 10*3/uL — ABNORMAL LOW (ref 150–400)
RBC: 5.03 MIL/uL (ref 4.22–5.81)
RDW: 14.9 % (ref 11.5–15.5)
WBC: 7 10*3/uL (ref 4.0–10.5)
nRBC: 0 % (ref 0.0–0.2)

## 2020-11-25 LAB — URINALYSIS, ROUTINE W REFLEX MICROSCOPIC
Bilirubin Urine: NEGATIVE
Glucose, UA: NEGATIVE mg/dL
Ketones, ur: NEGATIVE mg/dL
Nitrite: NEGATIVE
Protein, ur: NEGATIVE mg/dL
RBC / HPF: >50 RBC/hpf — ABNORMAL HIGH (ref 0–5)
Specific Gravity, Urine: 1.015 (ref 1.005–1.030)
pH: 6 (ref 5.0–8.0)

## 2020-11-25 IMAGING — MR MR THORACIC SPINE W/O CM
7 series · 41 of 48 positions shown · non-contrast
Comparison: None.

CLINICAL DATA: Mid-back pain, neuro deficit

EXAM:
MRI THORACIC SPINE WITHOUT CONTRAST
TECHNIQUE: Multiplanar, multisequence MR imaging of the thoracic spine was
performed. No intravenous contrast was administered.

[Series 16: T1 · sagittal · 4.0mm · 1.72mm/px · 4 of 10 slices shown (1 of 3)]
[im 1/10]
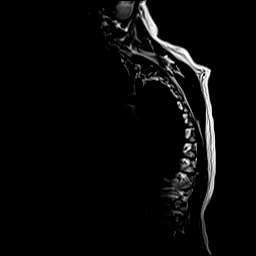
[im 4/10]
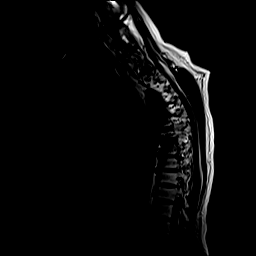
[im 7/10]
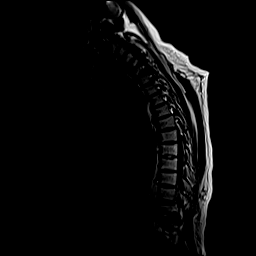
[im 10/10]
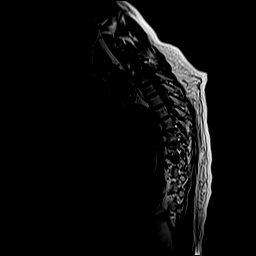

[Series 21: STIR · sagittal · 3.0mm · 1.00mm/px · 6 of 20 slices shown]
[im 1/20]
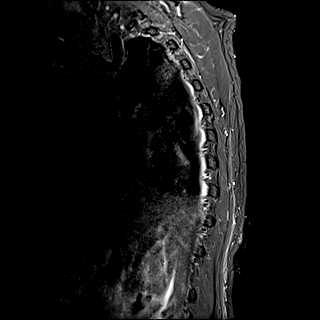
[im 4/20]
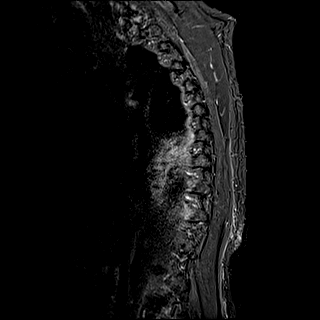
[im 8/20]
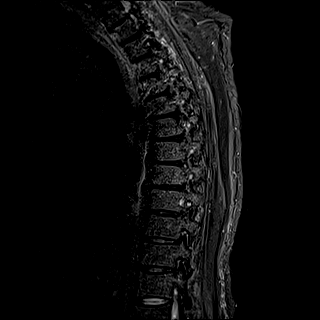
[im 12/20]
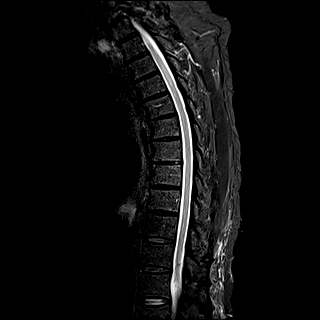
[im 16/20]
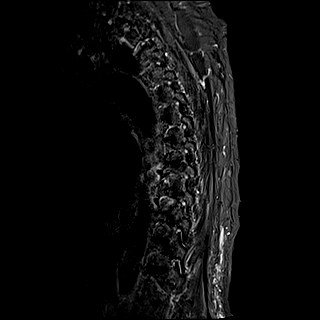
[im 20/20]
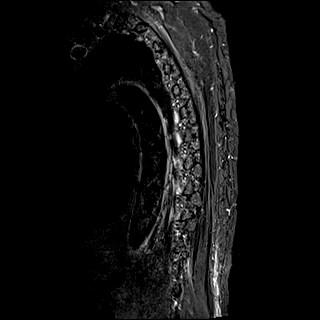

[Series 22: T1 · sagittal · 3.0mm · 1.00mm/px · 4 of 15 slices shown (2 of 3)]
[im 1/15]
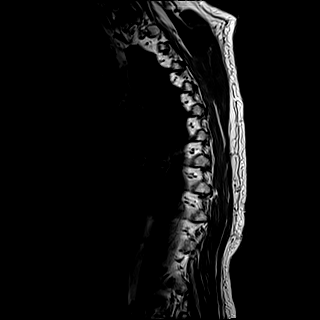
[im 5/15]
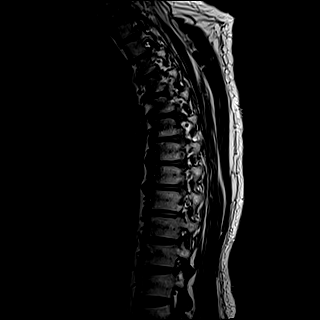
[im 10/15]
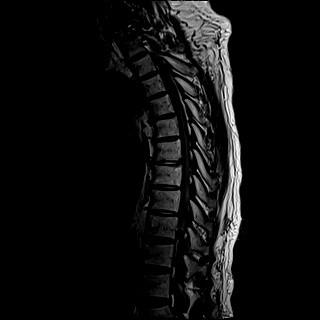
[im 15/15]
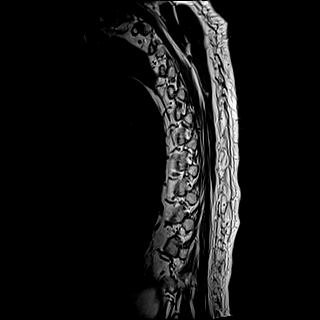

[Series 23: T1 · sagittal · 3.0mm · 1.00mm/px · 6 of 20 slices shown (3 of 3)]
[im 1/20]
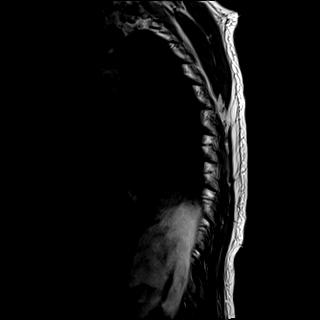
[im 4/20]
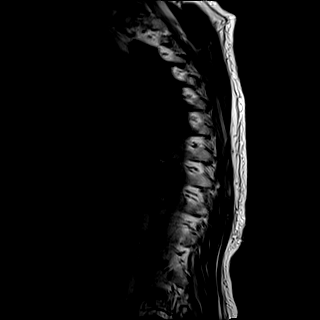
[im 8/20]
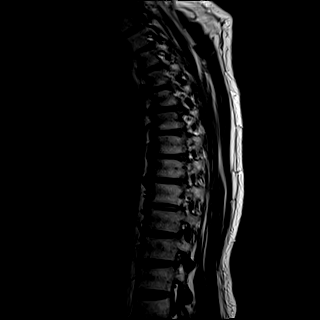
[im 12/20]
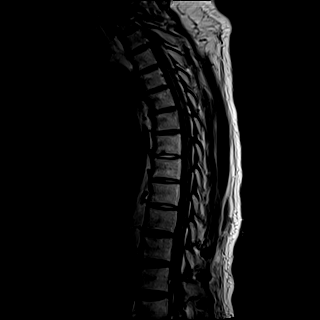
[im 16/20]
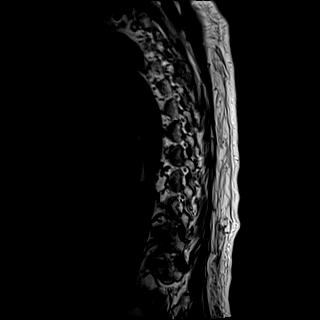
[im 20/20]
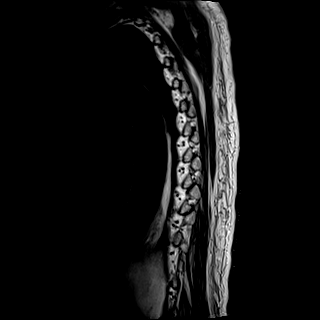

[Series 24: T2 · sagittal · 3.0mm · 0.83mm/px · 6 of 20 slices shown (1 of 2)]
[im 1/20]
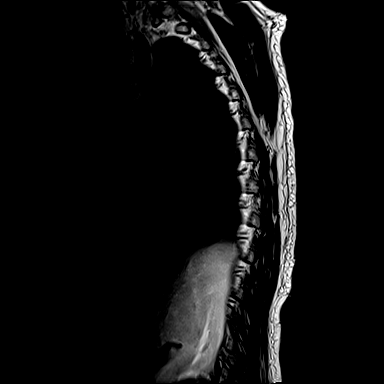
[im 4/20]
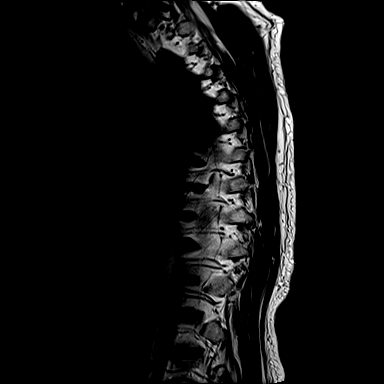
[im 8/20]
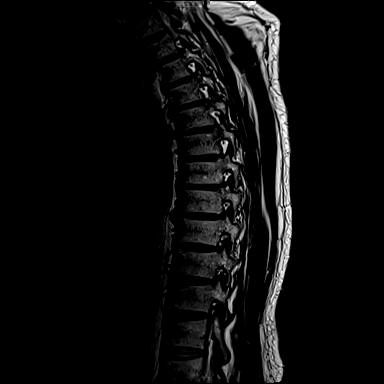
[im 12/20]
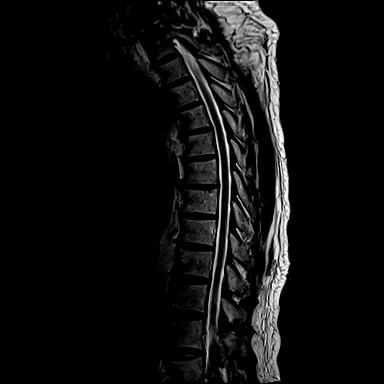
[im 16/20]
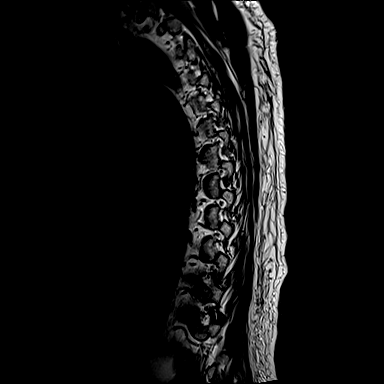
[im 20/20]
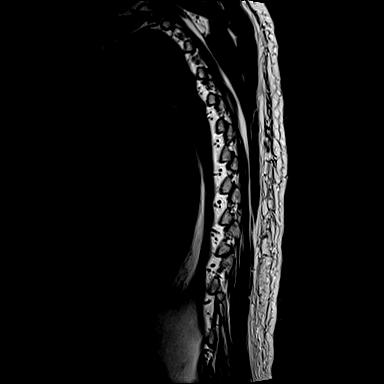

[Series 25: T2 · axial · 4.0mm · 0.86mm/px · z∈[-224,-2]mm · 11 of 39 slices shown (2 of 2)]
[im 1/39]
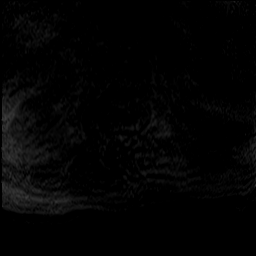
[im 4/39]
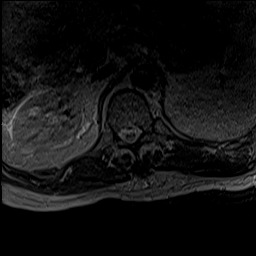
[im 8/39]
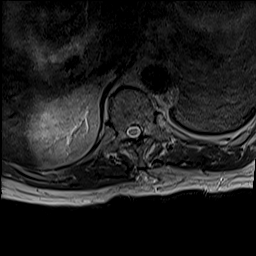
[im 12/39]
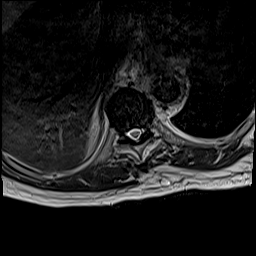
[im 16/39]
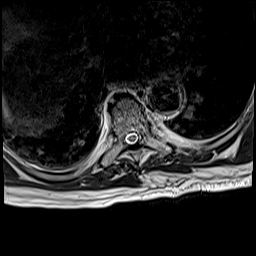
[im 20/39]
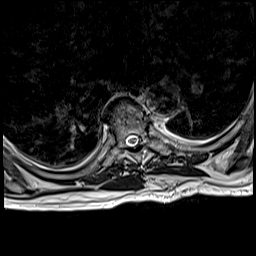
[im 23/39]
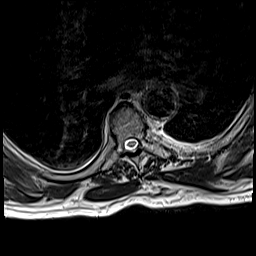
[im 27/39]
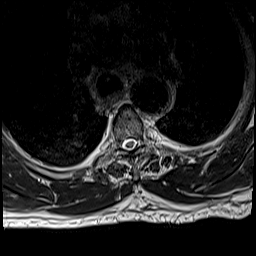
[im 31/39]
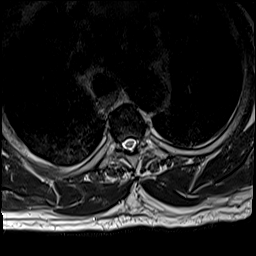
[im 35/39]
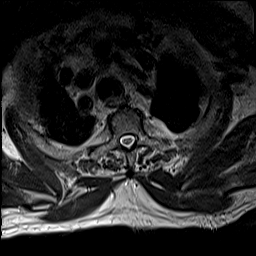
[im 39/39]
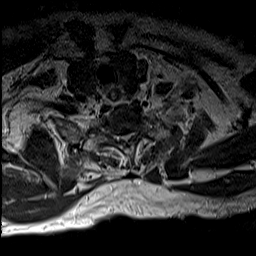

[Series 26: t2_me2d_tra · axial · 4.0mm · 0.43mm/px · z∈[-224,-91]mm · 4 of 39 slices shown]
[im 1/39]
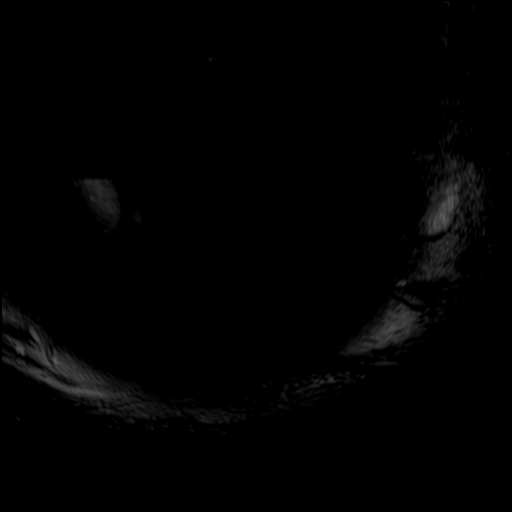
[im 8/39]
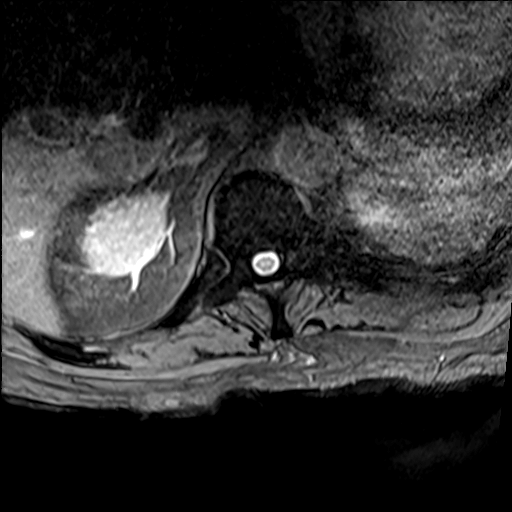
[im 12/39]
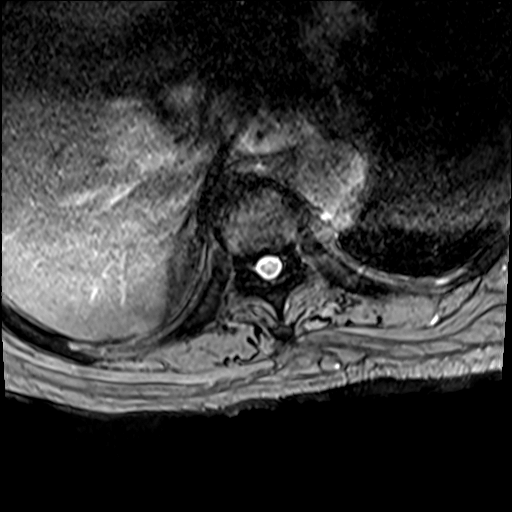
[im 16/39]
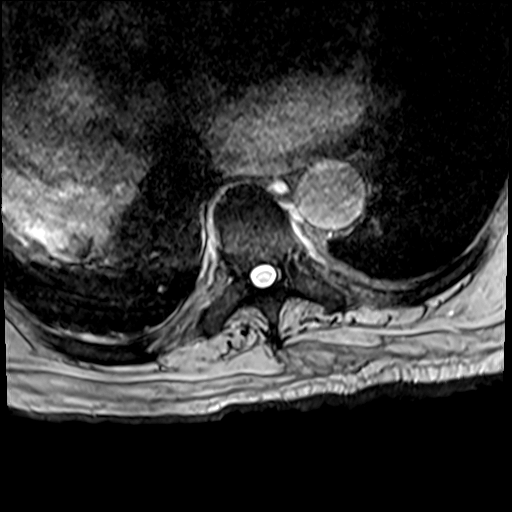

[41 of 48 positions shown; findings below may reference images not displayed]

FINDINGS: Alignment:  Normal.

Vertebrae: Vertebral body heights are maintained. No focal marrow
edema to suggest acute fracture or discitis/osteomyelitis. No
suspicious bone lesions.

Cord: No clear cord signal abnormality; however, the cervical cord
is poorly evaluated at C7-T1 in the region of stenosis.

Paraspinal and other soft tissues: Likely trace bilateral pleural
effusions.

Disc levels:

At C7-T1 there is posterior disc osteophyte complex with ligamentum
flavum thickening and likely severe canal stenosis, which is
partially imaged.

Small disc bulges at T2-T3, T5-T6 and T7-T8 with ligamentum flavum
thickening and mild canal stenosis. Multilevel facet hypertrophy
with moderate bilateral foraminal stenosis at T10-T11 and T11-T12,
greatest on the left at T10-T11. Mild to moderate right foraminal
stenosis at T9-T10. Otherwise, multilevel mild foraminal stenosis in
the thoracic spine.
IMPRESSION: 1. At C7-T1, likely severe canal stenosis that is partially imaged.
Probable foraminal stenosis as well, poorly evaluated. Recommend MRI
of the cervical spine to further evaluate.
2. Moderate bilateral foraminal stenosis at T10-T11 and T11-T12,
greatest on the left at T10-T11. Mild to moderate right foraminal
stenosis at T9-T10 and multilevel mild foraminal stenosis.
3. Mild multilevel canal stenosis in the thoracic spine.

## 2020-11-25 IMAGING — MR MR CERVICAL SPINE W/O CM
4 of 6 series · 32 of 48 positions shown · non-contrast
Comparison: None.

CLINICAL DATA: Motor neuron disease abnormal c7 on thoracic image
and lower ext weakness

EXAM:
MRI CERVICAL SPINE WITHOUT CONTRAST
TECHNIQUE: Multiplanar, multisequence MR imaging of the cervical spine was
performed. No intravenous contrast was administered.

[Series 5: T1 · sagittal · 3.0mm · 0.69mm/px · 6 of 15 slices shown (1 of 2)]
[im 1/15]
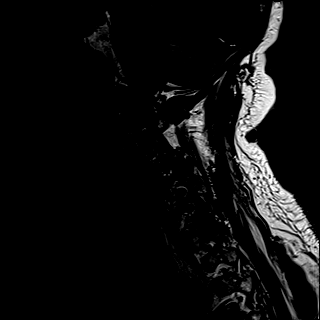
[im 3/15]
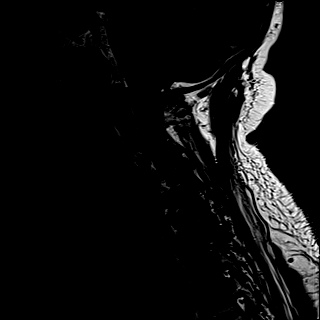
[im 6/15]
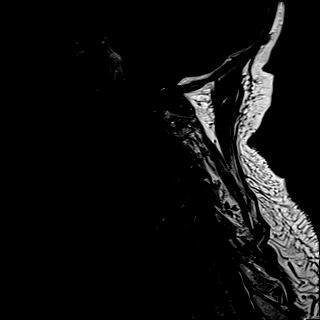
[im 9/15]
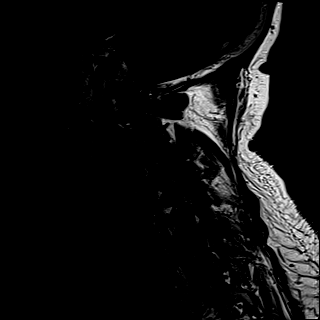
[im 12/15]
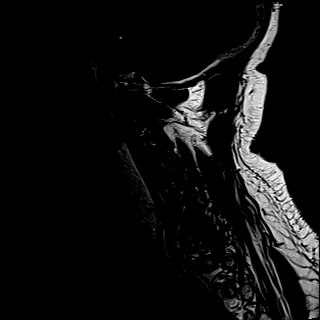
[im 15/15]
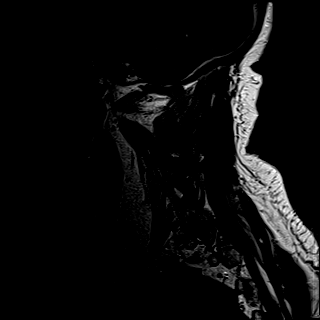

[Series 6: T2 · sagittal · 3.0mm · 0.69mm/px · 6 of 15 slices shown (1 of 2)]
[im 1/15]
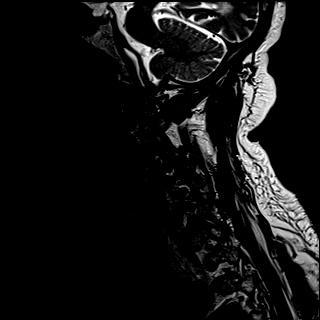
[im 3/15]
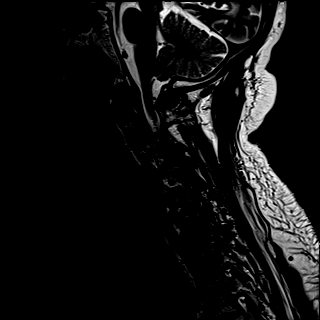
[im 6/15]
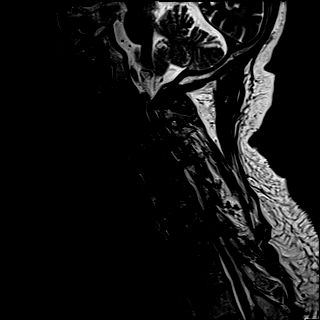
[im 9/15]
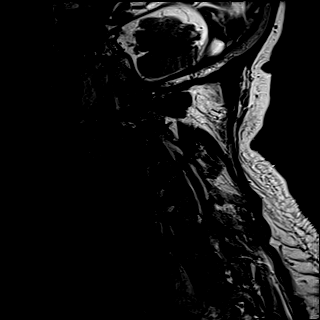
[im 12/15]
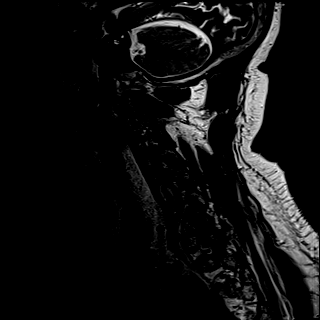
[im 15/15]
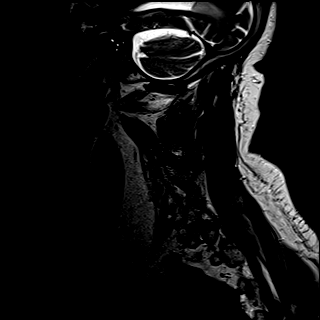

[Series 9: T2 · axial · 3.0mm · 0.70mm/px · z∈[-45,+45]mm · 11 of 32 slices shown (2 of 2)]
[im 1/32]
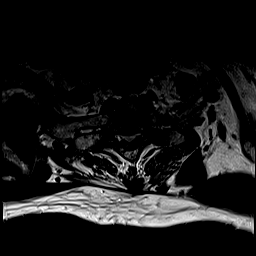
[im 4/32]
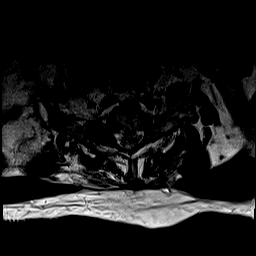
[im 7/32]
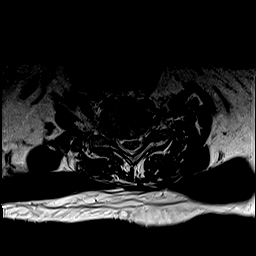
[im 10/32]
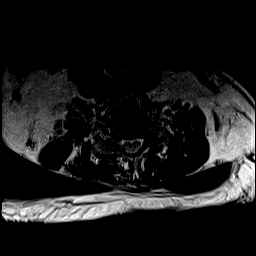
[im 13/32]
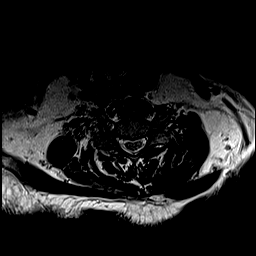
[im 16/32]
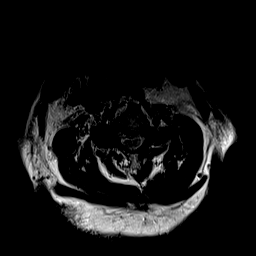
[im 19/32]
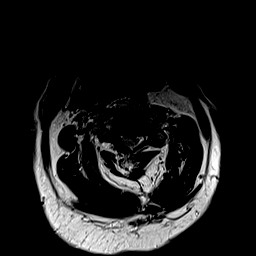
[im 22/32]
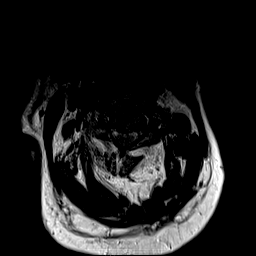
[im 25/32]
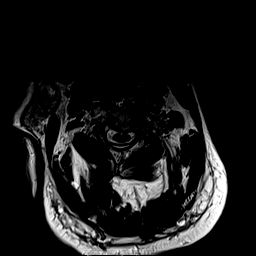
[im 28/32]
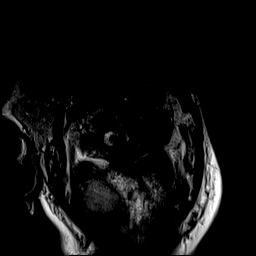
[im 32/32]
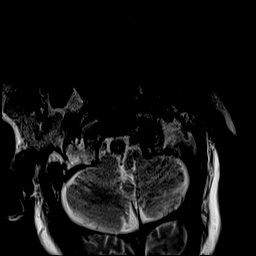

[Series 10: T1 · axial · 3.0mm · 0.35mm/px · z∈[-45,+34]mm · 9 of 32 slices shown (2 of 2)]
[im 1/32]
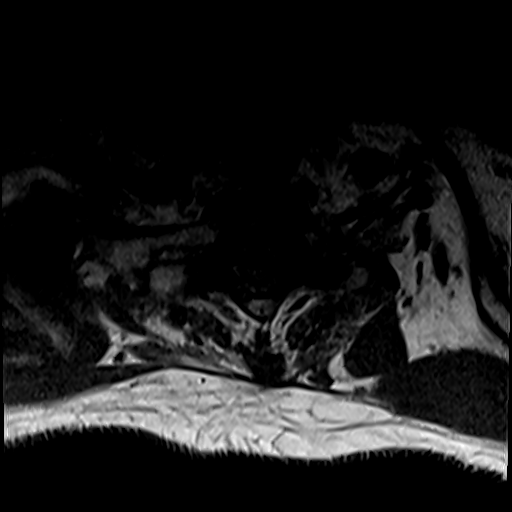
[im 4/32]
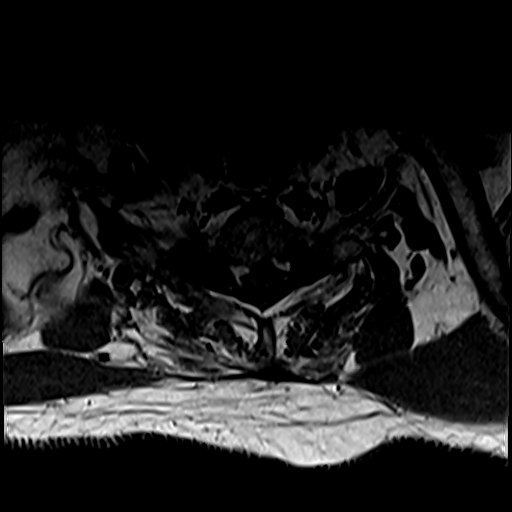
[im 7/32]
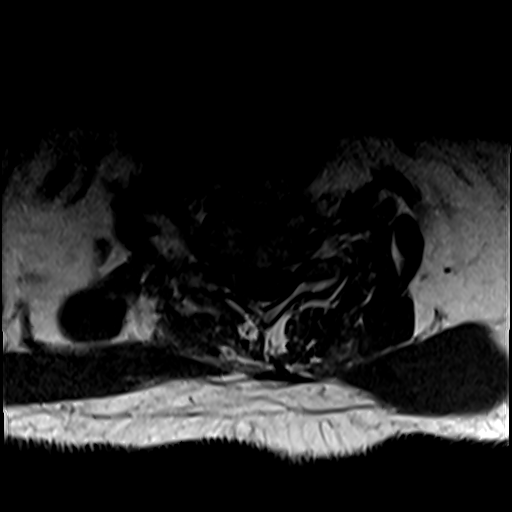
[im 10/32]
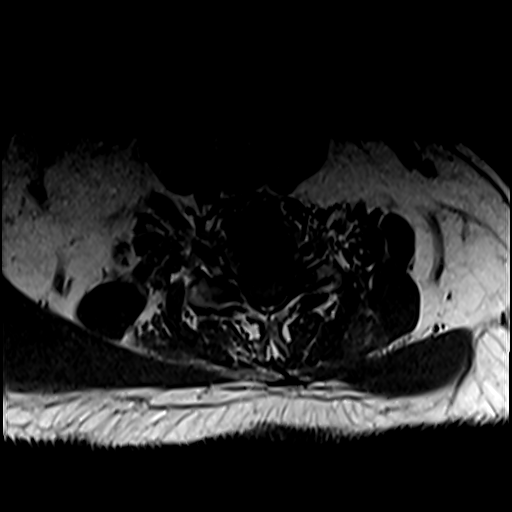
[im 13/32]
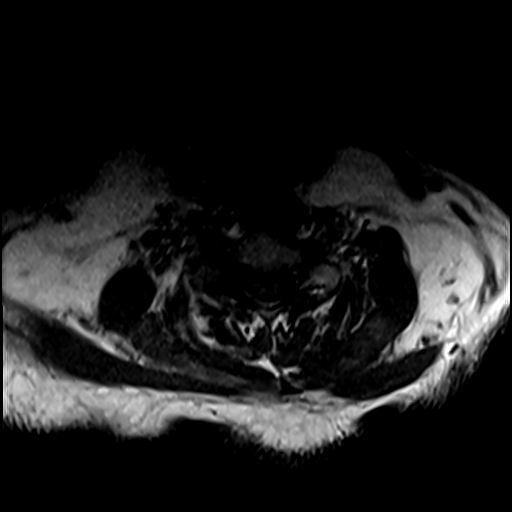
[im 16/32]
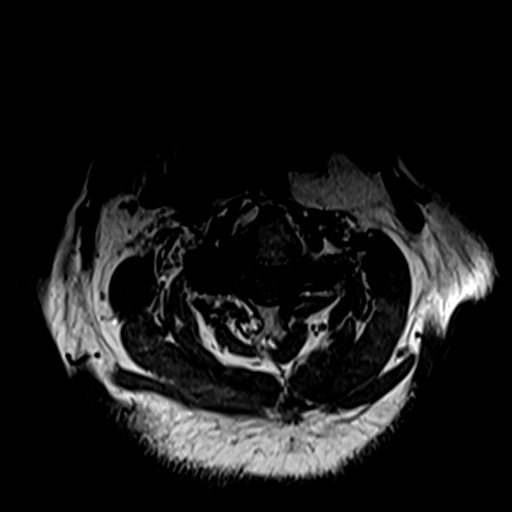
[im 19/32]
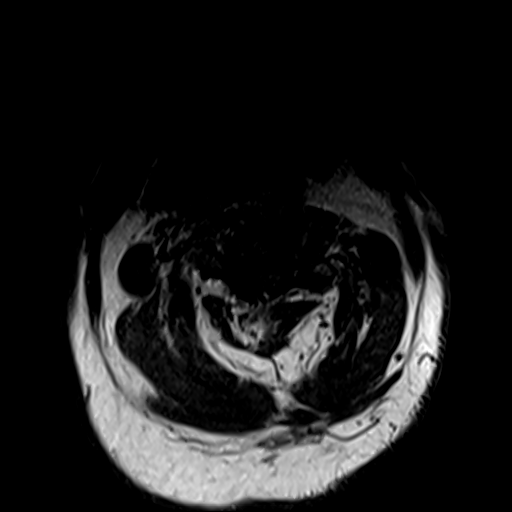
[im 22/32]
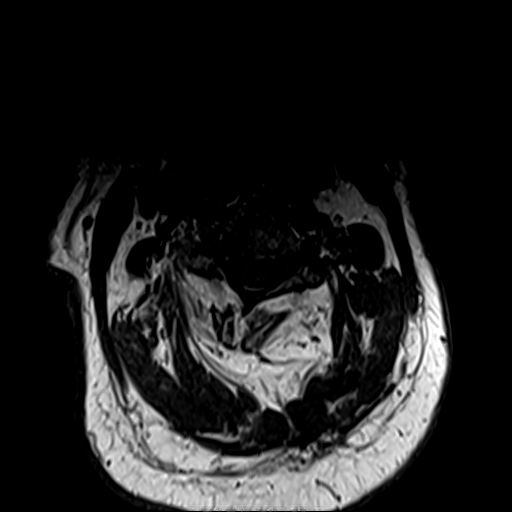
[im 28/32]
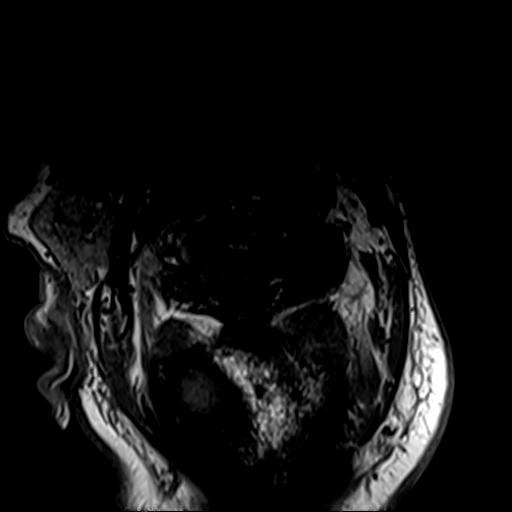

[32 of 48 positions shown; findings below may reference images not displayed]

FINDINGS: Alignment: Reversal of the normal cervical lordosis. Approximately 3
mm of anterolisthesis of C3 on C4 and C4 on C5.

Vertebrae: Multilevel degenerative/discogenic endplate signal
changes. No specific evidence of acute fracture or
discitis/osteomyelitis.

Cord: Motion limited evaluation with possible slight T2
hyperintensity of the cord at C7-T1.

Posterior Fossa, vertebral arteries, paraspinal tissues: Diminished
right vertebral artery flow void. Left vertebral artery flow void is
maintained. No evidence of acute abnormality in the visualized
posterior fossa.

Disc levels:

C1-C2: Severe craniocervical degenerative change, greatest on the
left with bulky posterior element hypertrophy contacting and
indenting the left eccentric cord with overall moderate canal
stenosis.

C2-C3: Posterior disc osteophyte complex and bilateral facet and
uncovertebral hypertrophy. Moderate left and mild right foraminal
stenosis. Mild to moderate canal stenosis.

C3-C4: Approximately 3 mm of anterolisthesis of C3 on C4. Posterior
disc osteophyte complex and bilateral facet and uncovertebral
hypertrophy. Resultant severe left and moderate right foraminal
stenosis with moderate canal stenosis.

C4-C5: Proximally 3 mm of anterolisthesis of C4 on C5 with posterior
disc osteophyte complex seen bilateral facet and uncovertebral
hypertrophy. Resulting severe bilateral foraminal stenosis with
moderate canal stenosis.

C5-C6: Posterior disc osteophyte complex with bilateral facet and
uncovertebral hypertrophy. Resulting moderate bilateral foraminal
stenosis with mild canal stenosis.

C6-C7: Posterior endplate spurring with bilateral facet and
uncovertebral hypertrophy. Resulting mild to moderate bilateral
foraminal stenosis and canal stenosis.

C7-T1: Posterior disc osteophyte complex with bulky ligamentum
flavum thickening and severe bilateral facet and uncovertebral
hypertrophy. Resulting in severe canal and bilateral foraminal
stenosis with deformity of the cord.
IMPRESSION: 1. At C7-T1, severe canal stenosis with deformity of the cord.
Possible slight T2 hyperintensity of the cord at this level could
represent edema and/or myelomalacia. Moderate canal stenosis at
C1-C2, C3-C4, C4-C5.
2. Severe foraminal stenosis on the left at C3-C4 and bilaterally at
C4-C5 and C7-T1. Moderate foraminal stenosis on the left at C2-C3,
right at C3-C4, and bilaterally at C5-C6 and C6-C7.
3. Diminished right vertebral artery flow void, which could be
related to proximal stenosis or occlusion. Consider CTA neck to
further evaluate.

## 2020-11-25 IMAGING — MR MR LUMBAR SPINE W/O CM
4 of 5 series · 30 of 48 positions shown · non-contrast
Comparison: None.

CLINICAL DATA: Low back pain, cauda equina syndrome suspected

EXAM:
MRI LUMBAR SPINE WITHOUT CONTRAST
TECHNIQUE: Multiplanar, multisequence MR imaging of the lumbar spine was
performed. No intravenous contrast was administered.

[Series 9: T1 · sagittal · 4.0mm · 0.81mm/px · 6 of 17 slices shown (1 of 2)]
[im 1/17]
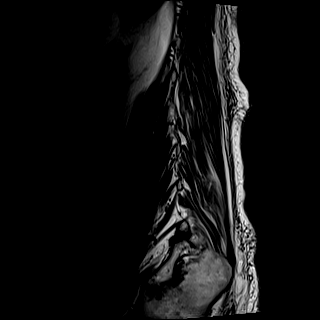
[im 4/17]
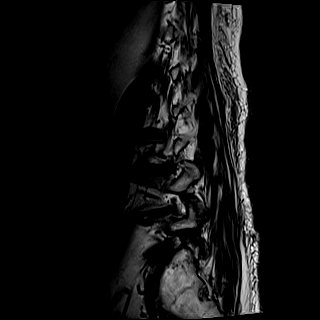
[im 7/17]
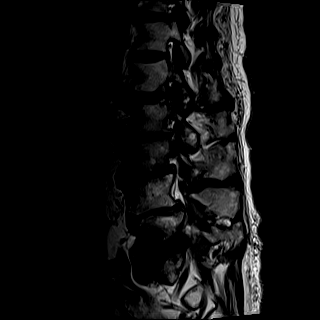
[im 10/17]
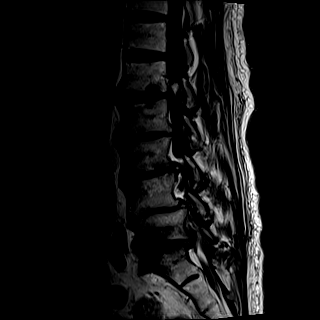
[im 13/17]
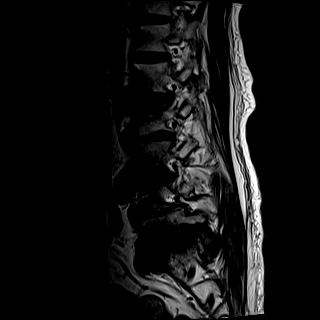
[im 17/17]
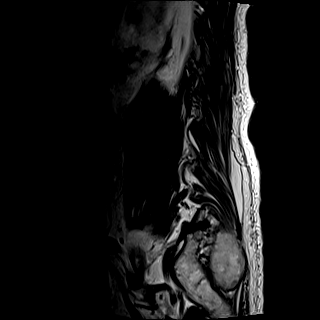

[Series 10: T2 · sagittal · 4.0mm · 0.81mm/px · 6 of 17 slices shown (1 of 2)]
[im 1/17]
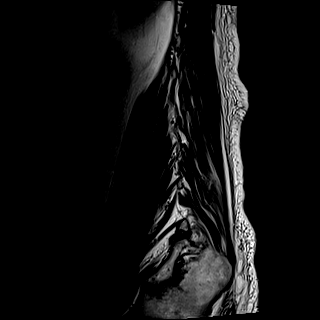
[im 4/17]
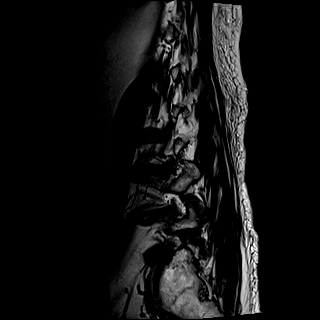
[im 7/17]
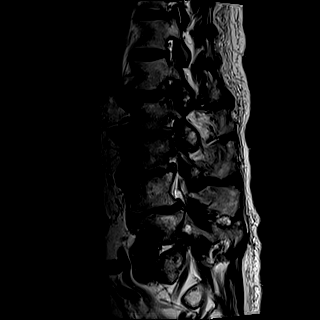
[im 10/17]
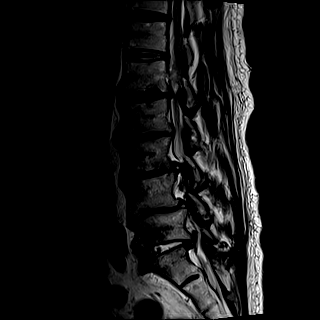
[im 13/17]
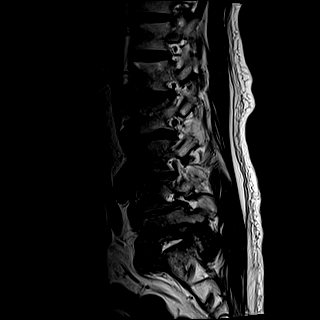
[im 17/17]
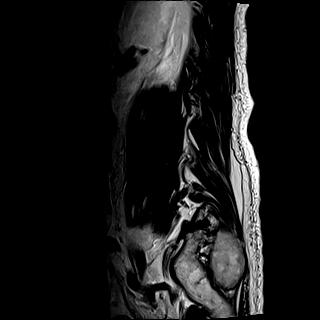

[Series 12: T2 · axial · 4.0mm · 0.69mm/px · z∈[-365,-162]mm · 9 of 39 slices shown (2 of 2)]
[im 1/39]
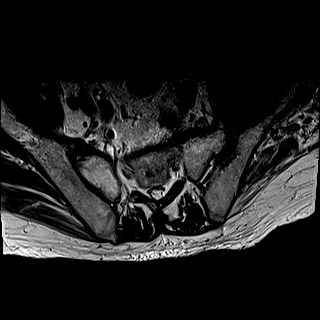
[im 6/39]
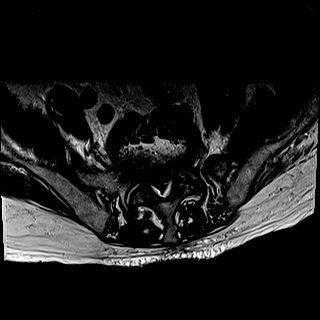
[im 11/39]
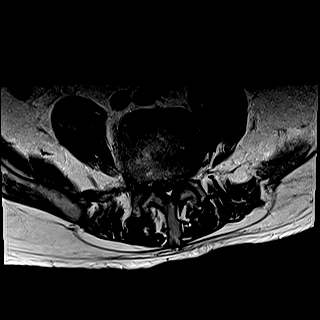
[im 17/39]
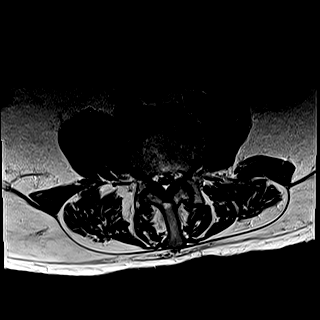
[im 20/39]
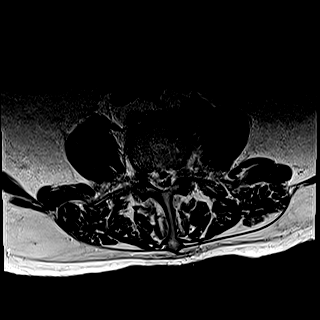
[im 22/39]
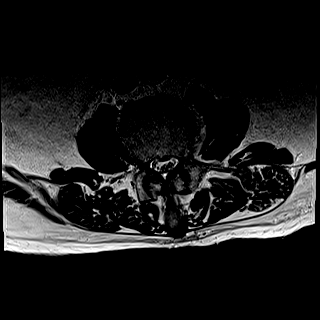
[im 28/39]
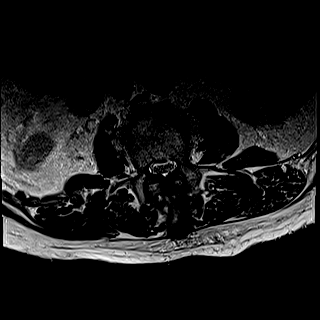
[im 33/39]
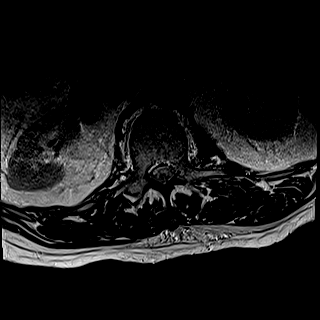
[im 39/39]
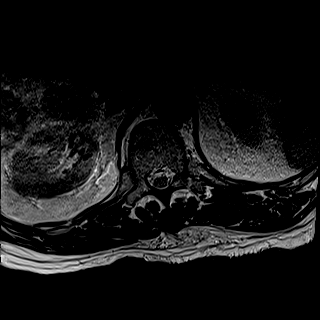

[Series 13: T1 · axial · 4.0mm · 0.43mm/px · z∈[-365,-162]mm · 9 of 39 slices shown (2 of 2)]
[im 1/39]
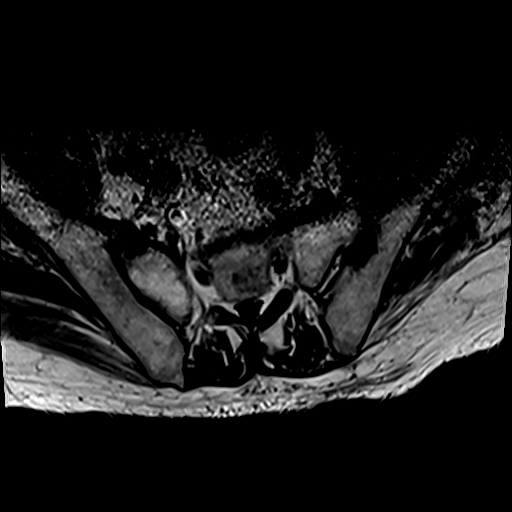
[im 6/39]
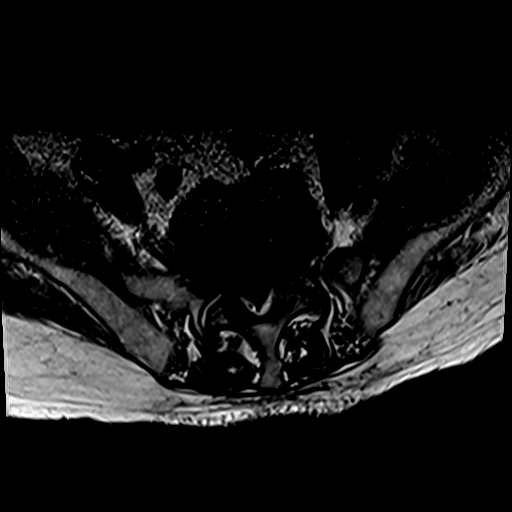
[im 11/39]
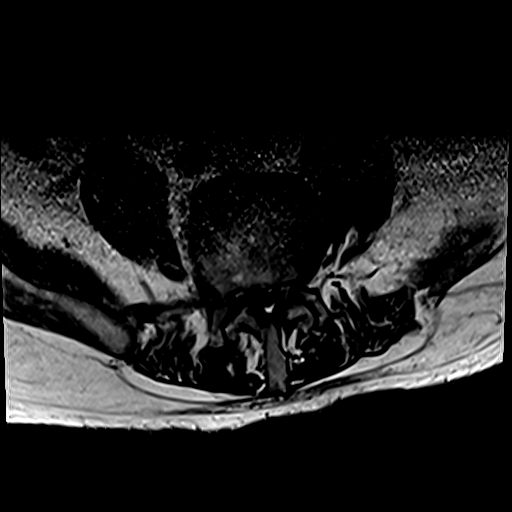
[im 17/39]
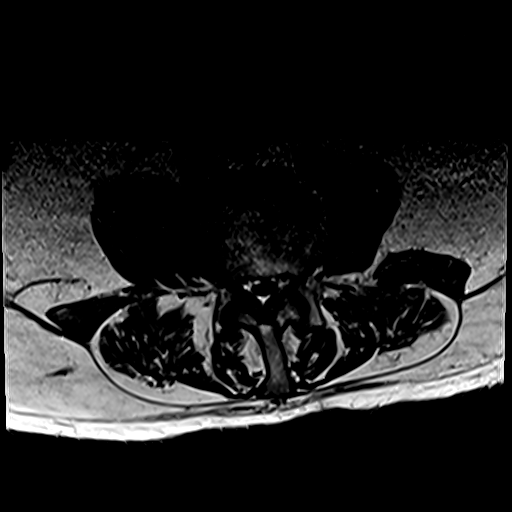
[im 20/39]
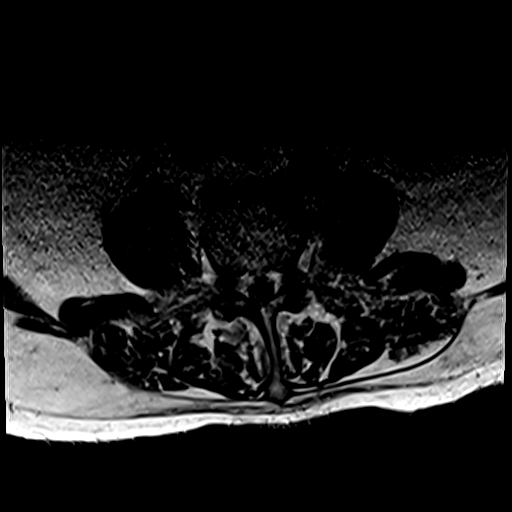
[im 22/39]
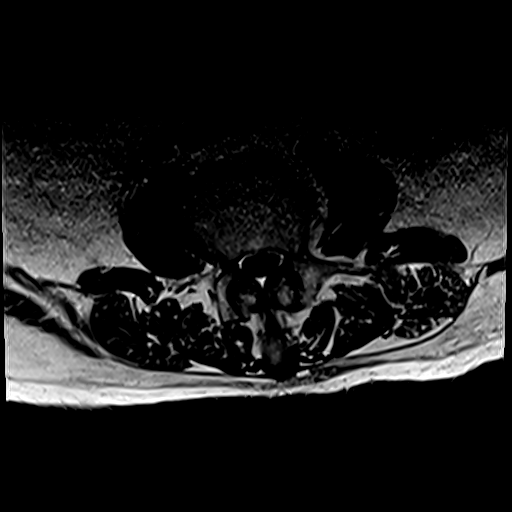
[im 28/39]
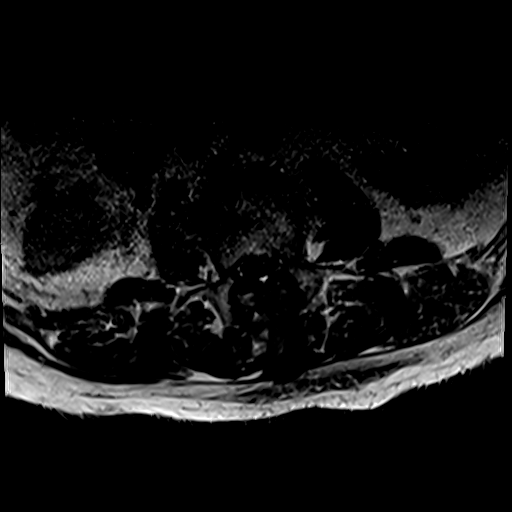
[im 33/39]
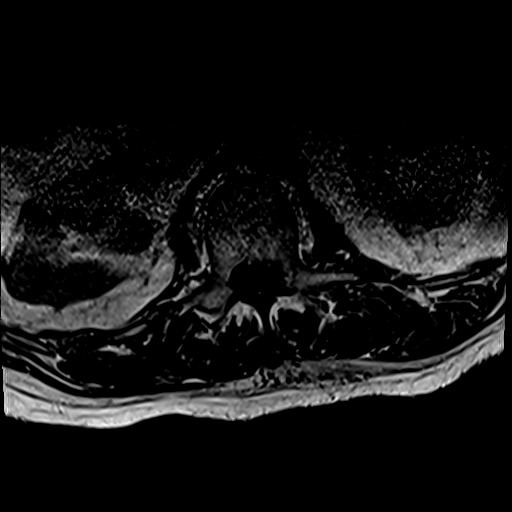
[im 39/39]
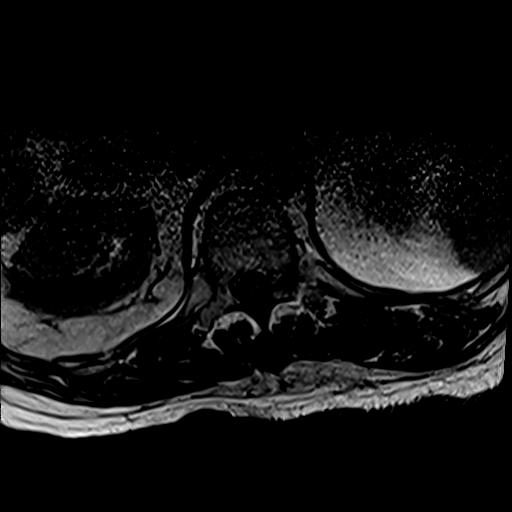

[30 of 48 positions shown; findings below may reference images not displayed]

FINDINGS: Segmentation:  Standard.

Alignment: 4 mm grade 1 anterolisthesis L4 on L5. Slight
retrolisthesis of L5 on S1.

Vertebrae: No fracture, evidence of discitis, or bone lesion. The L2
and L3 vertebral bodies are partially fused anteriorly. Diffuse
intrinsic canal narrowing on the basis of congenitally short
pedicles.

Conus medullaris and cauda equina: Conus extends to the L1-2 level.
Conus and cauda equina appear normal.

Paraspinal and other soft tissues: Negative.

Disc levels:

T12-L1: No disc protrusion. Unremarkable facets. No foraminal or
canal stenosis.

L1-L2: Mild diffuse disc bulge. Mild bilateral facet arthropathy and
ligamentum flavum buckling. Findings contribute to moderate canal
stenosis with moderate to severe bilateral foraminal stenosis, left
worse than right.

L2-L3: Mild endplate ridging. Bilateral facet arthropathy and mild
ligamentum flavum buckling. Findings result in mild-to-moderate
canal stenosis with mild bilateral foraminal stenosis.

L3-L4: Diffuse disc osteophyte complex with right paracentral disc
protrusion. Moderate bilateral facet arthropathy and ligamentum
flavum buckling. Findings result in severe canal stenosis with
moderate bilateral foraminal stenosis, right worse than left.

L4-L5: Diffuse disc bulge and endplate ridging. Right worse than
left facet arthropathy. Moderate to severe canal stenosis with
mild-to-moderate bilateral foraminal stenosis.

L5-S1: Diffuse disc bulge and endplate ridging with moderate
bilateral facet arthropathy. Findings result in moderate to severe
canal stenosis with severe bilateral foraminal stenosis.
IMPRESSION: 1. Advanced multilevel degenerative changes of the lumbar spine
superimposed on a congenitally narrow canal. Findings are most
pronounced at the L3-4 level where there is severe canal stenosis
and moderate bilateral foraminal stenosis.
2. Moderate-to-severe canal stenosis at L4-5 and L5-S1.
3. Severe bilateral foraminal stenosis at L5-S1.

## 2020-11-25 MED ORDER — ATORVASTATIN CALCIUM 40 MG PO TABS
40.0000 mg | ORAL_TABLET | Freq: Every day | ORAL | Status: DC
  Administered 2020-11-25 – 2020-12-01 (×6): 40 mg via ORAL
  Filled 2020-11-25 (×7): qty 1

## 2020-11-25 MED ORDER — LATANOPROST 0.005 % OP SOLN
1.0000 [drp] | Freq: Every day | OPHTHALMIC | Status: DC
  Administered 2020-11-26 – 2020-11-30 (×2): 1 [drp] via OPHTHALMIC
  Filled 2020-11-25: qty 2.5

## 2020-11-25 MED ORDER — ACETAMINOPHEN 500 MG PO TABS
1000.0000 mg | ORAL_TABLET | Freq: Three times a day (TID) | ORAL | Status: DC
  Administered 2020-11-26 – 2020-11-27 (×7): 1000 mg via ORAL
  Filled 2020-11-25 (×8): qty 2

## 2020-11-25 MED ORDER — SODIUM CHLORIDE 0.9 % IV SOLN: 250.0000 mL | INTRAVENOUS | Status: DC | PRN

## 2020-11-25 MED ORDER — METOPROLOL TARTRATE 25 MG PO TABS
25.0000 mg | ORAL_TABLET | Freq: Two times a day (BID) | ORAL | Status: DC
  Administered 2020-11-25 – 2020-12-01 (×11): 25 mg via ORAL
  Filled 2020-11-25 (×12): qty 1

## 2020-11-25 MED ORDER — SODIUM CHLORIDE 0.9 % IV SOLN
1.0000 g | Freq: Once | INTRAVENOUS | Status: AC
  Administered 2020-11-25: 1 g via INTRAVENOUS
  Filled 2020-11-25: qty 10

## 2020-11-25 MED ORDER — LISINOPRIL 10 MG PO TABS
10.0000 mg | ORAL_TABLET | Freq: Every day | ORAL | Status: DC
  Administered 2020-11-25 – 2020-12-01 (×6): 10 mg via ORAL
  Filled 2020-11-25 (×7): qty 1

## 2020-11-25 MED ORDER — METFORMIN HCL 500 MG PO TABS
500.0000 mg | ORAL_TABLET | Freq: Two times a day (BID) | ORAL | Status: DC
  Administered 2020-11-25 – 2020-11-27 (×5): 500 mg via ORAL
  Filled 2020-11-25 (×6): qty 1

## 2020-11-25 MED ORDER — LORATADINE 10 MG PO TABS
10.0000 mg | ORAL_TABLET | Freq: Every day | ORAL | Status: DC
  Administered 2020-11-25 – 2020-12-01 (×6): 10 mg via ORAL
  Filled 2020-11-25 (×7): qty 1

## 2020-11-25 MED ORDER — DORZOLAMIDE HCL 2 % OP SOLN
1.0000 [drp] | Freq: Two times a day (BID) | OPHTHALMIC | Status: DC
  Administered 2020-11-26 – 2020-12-01 (×11): 1 [drp] via OPHTHALMIC
  Filled 2020-11-25: qty 10

## 2020-11-25 MED ORDER — INSULIN ASPART 100 UNIT/ML IJ SOLN
0.0000 [IU] | Freq: Three times a day (TID) | INTRAMUSCULAR | Status: DC
  Administered 2020-11-25: 5 [IU] via SUBCUTANEOUS
  Administered 2020-11-26: 2 [IU] via SUBCUTANEOUS
  Administered 2020-11-26: 5 [IU] via SUBCUTANEOUS
  Administered 2020-11-26 – 2020-11-27 (×2): 3 [IU] via SUBCUTANEOUS
  Administered 2020-11-27: 2 [IU] via SUBCUTANEOUS
  Administered 2020-11-27: 3 [IU] via SUBCUTANEOUS
  Administered 2020-11-28 (×2): 2 [IU] via SUBCUTANEOUS
  Administered 2020-11-29: 3 [IU] via SUBCUTANEOUS
  Administered 2020-11-29 – 2020-11-30 (×2): 2 [IU] via SUBCUTANEOUS
  Administered 2020-11-30 (×2): 3 [IU] via SUBCUTANEOUS
  Administered 2020-12-01: 5 [IU] via SUBCUTANEOUS
  Administered 2020-12-01 (×2): 3 [IU] via SUBCUTANEOUS
  Filled 2020-11-25: qty 0.15

## 2020-11-25 MED ORDER — SODIUM CHLORIDE 0.9% FLUSH: 3.0000 mL | INTRAVENOUS | Status: DC | PRN

## 2020-11-25 MED ORDER — SENNA 8.6 MG PO TABS
1.0000 | ORAL_TABLET | Freq: Two times a day (BID) | ORAL | Status: DC
  Administered 2020-11-25 – 2020-12-01 (×11): 8.6 mg via ORAL
  Filled 2020-11-25 (×12): qty 1

## 2020-11-25 MED ORDER — HYDROMORPHONE HCL 1 MG/ML IJ SOLN: 0.5000 mg | INTRAMUSCULAR | Status: DC | PRN

## 2020-11-25 MED ORDER — ALBUTEROL SULFATE (2.5 MG/3ML) 0.083% IN NEBU: 2.5000 mg | INHALATION_SOLUTION | RESPIRATORY_TRACT | Status: DC | PRN

## 2020-11-25 MED ORDER — BRIMONIDINE TARTRATE 0.2 % OP SOLN
1.0000 [drp] | Freq: Two times a day (BID) | OPHTHALMIC | Status: DC
  Administered 2020-11-26 – 2020-12-01 (×11): 1 [drp] via OPHTHALMIC
  Filled 2020-11-25: qty 5

## 2020-11-25 MED ORDER — TAMSULOSIN HCL 0.4 MG PO CAPS
0.4000 mg | ORAL_CAPSULE | Freq: Two times a day (BID) | ORAL | Status: DC
  Administered 2020-11-26 – 2020-12-01 (×11): 0.4 mg via ORAL
  Filled 2020-11-25 (×12): qty 1

## 2020-11-25 MED ORDER — SODIUM CHLORIDE 0.9% FLUSH
3.0000 mL | Freq: Two times a day (BID) | INTRAVENOUS | Status: DC
  Administered 2020-11-26 – 2020-12-01 (×11): 3 mL via INTRAVENOUS

## 2020-11-25 MED ORDER — HEPARIN SODIUM (PORCINE) 5000 UNIT/ML IJ SOLN
5000.0000 [IU] | Freq: Three times a day (TID) | INTRAMUSCULAR | Status: AC
  Administered 2020-11-26 – 2020-11-27 (×6): 5000 [IU] via SUBCUTANEOUS
  Filled 2020-11-25 (×6): qty 1

## 2020-11-25 MED ORDER — HEPARIN SODIUM (PORCINE) 5000 UNIT/ML IJ SOLN
5000.0000 [IU] | Freq: Three times a day (TID) | INTRAMUSCULAR | Status: DC
  Administered 2020-11-25: 5000 [IU] via SUBCUTANEOUS
  Filled 2020-11-25: qty 1

## 2020-11-25 MED ORDER — ASPIRIN EC 81 MG PO TBEC
81.0000 mg | DELAYED_RELEASE_TABLET | Freq: Every day | ORAL | Status: DC
  Administered 2020-11-25 – 2020-11-27 (×3): 81 mg via ORAL
  Filled 2020-11-25 (×3): qty 1

## 2020-11-25 MED ORDER — ALBUTEROL SULFATE HFA 108 (90 BASE) MCG/ACT IN AERS: 1.0000 | INHALATION_SPRAY | RESPIRATORY_TRACT | Status: DC | PRN

## 2020-11-25 MED ORDER — PANTOPRAZOLE SODIUM 40 MG PO TBEC
40.0000 mg | DELAYED_RELEASE_TABLET | Freq: Every day | ORAL | Status: DC
  Administered 2020-11-25 – 2020-12-01 (×6): 40 mg via ORAL
  Filled 2020-11-25 (×7): qty 1

## 2020-11-25 NOTE — ED Provider Notes (Signed)
[facility]EM[facility]GENCY DEPT Provider Note   CSN: [id number] Arrival date & time: 11/25/20  4098     History Chief Complaint  Patient presents with   urine retention   Flank Pain   Constipation    Michael Jensen is a 85 y.o. male.  Patient is an 85 year old male with a history of hypertension, hyperlipidemia, BPH and diabetes who is presenting today with 3 days of inability to have a bowel movement and complaints of urinary retention.  He is reporting significant pressure in the bladder area that then radiates into his bilateral flanks.  He denies fever, nausea or vomiting.  He has continued to eat.  He reports having symptoms like this in the past that required a catheter approximately 5 years ago.  He denies any recent medication changes.  The pain is significant and 8 out of 10.  It is made worse with movement or palpation.  He has frequency and urgency but no urine comes out.  He reports it is not unusual for him to have a bowel movement every 2 to 3 days but his urinary symptoms are new.  He denies any cough, congestion, shortness of breath or chest pain.  The history is provided by the patient.  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)    BPH (benign prostatic hyperplasia)    GERD (gastroesophageal reflux disease)    Glaucoma    HTN (hypertension)    Hyperlipidemia    OA (osteoarthritis)     Patient Active Problem List   Diagnosis Date Noted   Benign prostatic hyperplasia without lower urinary tract symptoms 03/04/2020   Diabetic peripheral neuropathy associated with type 2 diabetes mellitus (HCC) 03/04/2020   Essential hypertension 03/04/2020   Gastroesophageal reflux disease without esophagitis 03/04/2020   Glaucoma 03/04/2020   Mixed hyperlipidemia 03/04/2020   Osteoarthritis of knee 03/04/2020   Thrombocytopenia (HCC) 03/04/2020    Past Surgical  History:  Procedure Laterality Date   HERNIA REPAIR         No family history on file.  Social History   Tobacco Use   Smoking status: Former   Smokeless tobacco: Never    Home Medications Prior to Admission medications   Medication Sig Start Date End Date Taking? Authorizing Provider  acetaminophen (TYLENOL) 500 MG tablet 1 tablet as needed    [provider]  albuterol (VENTOLIN HFA) 108 (90 Base) MCG/ACT inhaler 2 puffs as needed 08/04/19   [provider]  Ascorbic Acid (VITAMIN C) 1000 MG tablet 1 tablet    [provider]  aspirin (ASPIR-LOW) 81 MG EC tablet 1 tablet    [provider]  aspirin EC 81 MG tablet Take 1 tablet (81 mg total) by mouth daily. 08/09/17   Deeann Saint, MD  atorvastatin (LIPITOR) 40 MG tablet 1 tablet    [provider]  atorvastatin (LIPITOR) 40 MG tablet Take 1 tablet by mouth daily.    [provider]  brimonidine (ALPHAGAN) 0.2 % ophthalmic solution INSTILL ONE DROP INTO THE RIGHT EYE TWICE DAILY 12/23/17   [provider]  Dextromethorphan-guaiFENesin (MUCINEX DM MAXIMUM STRENGTH) 60-1200 MG TB12 1 tablet as needed 08/04/19   [provider]  dorzolamide (TRUSOPT) 2 % ophthalmic solution INSTILL 1 DROP TWICE A DAY INTO RIGHT EYE 10/22/17   [provider]  latanoprost (XALATAN) 0.005 % ophthalmic solution  11/22/17   [provider]  lisinopril (ZESTRIL) 10 MG tablet 1 tablet    [provider]  lisinopril (ZESTRIL) 20 MG tablet 1 tablet    [provider]  loratadine (CLARITIN) 10 MG tablet 1 tablet 08/04/19   [provider]  metFORMIN (GLUCOPHAGE) 500 MG tablet Take 1 tablet by mouth 2 (two) times daily with a meal.    [provider]  metFORMIN (GLUCOPHAGE) 500 MG tablet 1 tablet with a meal    [provider]  metoprolol tartrate (LOPRESSOR) 25 MG tablet Take 1 tablet by mouth 2 (two) times daily with a meal.     [provider]  Multiple Vitamins-Minerals (CENTRUM SILVER) tablet 1 tablet    [provider]  pantoprazole (PROTONIX) 40 MG tablet Take 1 tablet by mouth daily.    [provider]  tamsulosin (FLOMAX) 0.4 MG CAPS capsule Take 1 capsule by mouth 2 (two) times daily.    [provider]    Allergies    Patient has no known allergies.  Review of Systems   Review of Systems  Gastrointestinal:  Positive for constipation.  Genitourinary:  Positive for flank pain.  All other systems reviewed and are negative.  Physical Exam Updated Vital Signs BP 117/62   Pulse 67   Temp 98.7 F (37.1 C) (Oral)   Resp 18   Ht 5\' 9"  (1.753 m)   Wt 89.8 kg   SpO2 98%   BMI 29.24 kg/m   Physical Exam Vitals and nursing note reviewed.  Constitutional:      General: He is not in acute distress.    Appearance: He is well-developed.  HENT:     Head: Normocephalic and atraumatic.     Nose: Nose normal.     Mouth/Throat:     Mouth: Mucous membranes are moist.  Eyes:     Extraocular Movements: Extraocular movements intact.     Conjunctiva/sclera: Conjunctivae normal.  Cardiovascular:     Rate and Rhythm: Normal rate and regular rhythm.     Heart sounds: No murmur heard. Pulmonary:     Effort: Pulmonary effort is normal. No respiratory distress.     Breath sounds: Normal breath sounds. No wheezing or rales.  Abdominal:     General: There is no distension.     Palpations: Abdomen is soft.     Tenderness: There is abdominal tenderness in the suprapubic area. There is right CVA tenderness and left CVA tenderness. There is no guarding or rebound.  Musculoskeletal:        General: No tenderness. Normal range of motion.     Cervical back: Normal range of motion and neck supple.     Right lower leg: Edema present.     Left lower leg: Edema present.     Comments: Minimal edema at the ankles  Skin:    General: Skin is warm and dry.     Findings: No erythema or  rash.  Neurological:     Mental Status: He is alert and oriented to person, place, and time.     Sensory: No sensory deficit.     Motor: Weakness present.     Comments: 3/5 strength in the left lower ext and 4/5 strength in the right lower ext.  5/5 strength in the bilateral upper ext.  No facial droop or speech problems.  Psychiatric:        Mood and Affect: Mood normal.        Behavior: Behavior  normal.    ED Results / Procedures / Treatments   Labs (all labs ordered are listed, but only abnormal results are displayed) Labs Reviewed  URINALYSIS, ROUTINE W REFLEX MICROSCOPIC - Abnormal; Notable for the following components:      Result Value   Color, Urine YELLOW (*)    APPearance CLOUDY (*)    Hgb urine dipstick LARGE (*)    Leukocytes,Ua MODERATE (*)    RBC / HPF >50 (*)    Bacteria, UA MANY (*)    All other components within normal limits  CBC WITH DIFFERENTIAL/PLATELET - Abnormal; Notable for the following components:   Platelets 130 (*)    All other components within normal limits  BASIC METABOLIC PANEL - Abnormal; Notable for the following components:   Glucose, Bld 166 (*)    All other components within normal limits  URINE CULTURE    EKG None  Radiology MR THORACIC SPINE WO CONTRAST  Result Date: 11/25/2020 CLINICAL DATA:  Mid-back pain, neuro deficit EXAM: MRI THORACIC SPINE WITHOUT CONTRAST TECHNIQUE: Multiplanar, multisequence MR imaging of the thoracic spine was performed. No intravenous contrast was administered. COMPARISON:  None. FINDINGS: Alignment:  Normal. Vertebrae: Vertebral body heights are maintained. No focal marrow edema to suggest acute fracture or discitis/osteomyelitis. No suspicious bone lesions. Cord: No clear cord signal abnormality; however, the cervical cord is poorly evaluated at C7-T1 in the region of stenosis. Paraspinal and other soft tissues: Likely trace bilateral pleural effusions. Disc levels: At C7-T1 there is posterior disc osteophyte  complex with ligamentum flavum thickening and likely severe canal stenosis, which is partially imaged. Small disc bulges at T2-T3, T5-T6 and T7-T8 with ligamentum flavum thickening and mild canal stenosis. Multilevel facet hypertrophy with moderate bilateral foraminal stenosis at T10-T11 and T11-T12, greatest on the left at T10-T11. Mild to moderate right foraminal stenosis at T9-T10. Otherwise, multilevel mild foraminal stenosis in the thoracic spine. IMPRESSION: 1. At C7-T1, likely severe canal stenosis that is partially imaged. Probable foraminal stenosis as well, poorly evaluated. Recommend MRI of the cervical spine to further evaluate. 2. Moderate bilateral foraminal stenosis at T10-T11 and T11-T12, greatest on the left at T10-T11. Mild to moderate right foraminal stenosis at T9-T10 and multilevel mild foraminal stenosis. 3. Mild multilevel canal stenosis in the thoracic spine. Electronically Signed   By: Feliberto Harts M.D.   On: 11/25/2020 12:20   MR LUMBAR SPINE WO CONTRAST  Result Date: 11/25/2020 CLINICAL DATA:  Low back pain, cauda equina syndrome suspected EXAM: MRI LUMBAR SPINE WITHOUT CONTRAST TECHNIQUE: Multiplanar, multisequence MR imaging of the lumbar spine was performed. No intravenous contrast was administered. COMPARISON:  None. FINDINGS: Segmentation:  Standard. Alignment: 4 mm grade 1 anterolisthesis L4 on L5. Slight retrolisthesis of L5 on S1. Vertebrae: No fracture, evidence of discitis, or bone lesion. The L2 and L3 vertebral bodies are partially fused anteriorly. Diffuse intrinsic canal narrowing on the basis of congenitally short pedicles. Conus medullaris and cauda equina: Conus extends to the L1-2 level. Conus and cauda equina appear normal. Paraspinal and other soft tissues: Negative. Disc levels: T12-L1: No disc protrusion. Unremarkable facets. No foraminal or canal stenosis. L1-L2: Mild diffuse disc bulge. Mild bilateral facet arthropathy and ligamentum flavum buckling.  Findings contribute to moderate canal stenosis with moderate to severe bilateral foraminal stenosis, left worse than right. L2-L3: Mild endplate ridging. Bilateral facet arthropathy and mild ligamentum flavum buckling. Findings result in mild-to-moderate canal stenosis with mild bilateral foraminal stenosis. L3-L4: Diffuse disc osteophyte complex with right paracentral disc  protrusion. Moderate bilateral facet arthropathy and ligamentum flavum buckling. Findings result in severe canal stenosis with moderate bilateral foraminal stenosis, right worse than left. L4-L5: Diffuse disc bulge and endplate ridging. Right worse than left facet arthropathy. Moderate to severe canal stenosis with mild-to-moderate bilateral foraminal stenosis. L5-S1: Diffuse disc bulge and endplate ridging with moderate bilateral facet arthropathy. Findings result in moderate to severe canal stenosis with severe bilateral foraminal stenosis. IMPRESSION: 1. Advanced multilevel degenerative changes of the lumbar spine superimposed on a congenitally narrow canal. Findings are most pronounced at the L3-4 level where there is severe canal stenosis and moderate bilateral foraminal stenosis. 2. Moderate-to-severe canal stenosis at L4-5 and L5-S1. 3. Severe bilateral foraminal stenosis at L5-S1. Electronically Signed   By: Duanne Guess D.O.   On: 11/25/2020 12:24    Procedures Procedures   Medications Ordered in ED Medications  cefTRIAXone (ROCEPHIN) 1 g in sodium chloride 0.9 % 100 mL IVPB (1 g Intravenous New Bag/Given 11/25/20 1047)    ED Course  I have reviewed the triage vital signs and the nursing notes.  Pertinent labs & imaging results that were available during my care of the patient were reviewed by me and considered in my medical decision making (see chart for details).    MDM Rules/Calculators/A&P                           Patient is an 85 year old male presenting today with concerns for urinary retention and  constipation.  He does have abdominal tenderness that radiates into his flanks but also has not urinated in the last 3 days.  He denies any symptoms of fever, nausea or vomiting.  Lower suspicion for obstruction at this time but concern for urinary retention based on his prior history of BPH.  Bladder scan pending.  Assuming we will insert Foley catheter.  We will check for UTI as well as will check labs.  Will reassess after catheter is placed to see if patient continues to have pain.  We will also then check a rectal to ensure no evidence of fecal impaction.  1:07 PM CBC and BMP within normal limits.  UA with concern for infection with moderate leukocytes, red cells white cells and many bacteria.  Also now daughter is present and reports that patient has gotten so weak to the point he is now bedbound.  He has not been able to walk for approximately 5 to 6 months now.  He has had ongoing back pain and his legs will not hold his weight.  He is getting physical therapy at home but nothing is helping.  They continue to call their doctor but daughter reports there is nothing he can do.  They have not brought him to the hospital until today because they did not feel like he had an emergent matter until he was unable to urinate.  He has not had MRI or evaluation for the weakness in his leg.  He has not seen a neurologist.  At times he can be confused and may not remember but most the time he is lucid per daughter's report.  On exam patient does have significant weakness in the lower extremities and downgoing toes bilaterally.  Concern for possible cord compression may be cauda equina as the cause of his urinary frequency today versus UTI and BPH.  Patient had 1 L of urine out when Foley catheter was placed and his abdominal pain completely resolved.  We will get  an MRI of the thoracic and lumbar spine for further evaluation.  1:07 PM MRI of the L-spine shows pronounced L3-4 canal stenosis and bilateral foraminal  stenosis and moderate to severe canal stenosis at L4-5 and L5/S1.  Also thoracic spine at C7-T1 and severe canal stenosis that is partially imaged as well as moderate stenosis at T10-11 and 11-12.  We will discuss findings with neurosurgery may need to have cervical MRI as well.  1:41 PM Spoke with Dr. Johnsie Cancel and he recommended to do the cervical spine imaging but also requested the patient be transferred to [facility].  Patient may require intervention.  Will discuss patient with the hospitalist as he also has a UTI and urinary retention unknown if that is related to UTI or ongoing back issues.  Final Clinical Impression(s) / ED Diagnoses Final diagnoses:  Lower urinary tract infectious disease  Urinary retention  Muscle weakness of lower extremity    Rx / DC Orders ED Discharge Orders     None        Gwyneth Sprout, MD 11/25/20 1349

## 2020-11-25 NOTE — H&P (Signed)
History and Physical    Michael Jensen LYY:[id number] DOB: 1935-04-06 DOA: 11/25/2020  PCP: Soundra Pilon, FNP  Patient coming from: home  I have personally briefly reviewed patient's old medical records in [facility]  Chief Complaint: lower abdominal pain, no micturition x 3 days  HPI: Michael Jensen is a 85 y.o. male with medical history significant of hypertension, hyperlipidemia, BPH and diabetes who is presenting today with 3 days of inability to have a bowel movement and complaints of urinary retention.  He is reporting significant pressure in the bladder area that then radiates into his bilateral flanks.  He denies fever, nausea or vomiting.  He has continued to eat.  He reports having symptoms like this in the past that required a catheter approximately 5 years ago.  He denies any recent medication changes.  The pain is significant and 8 out of 10.  It is made worse with movement or palpation.  He has frequency and urgency but no urine comes out.  He reports it is not unusual for him to have a bowel movement every 2 to 3 days but his urinary symptoms are new.  He denies any cough, congestion, shortness of breath or chest pain.   Family reports that the patient has had progressive LE weakness over the past several months to the point where he is now bedbound. Family reports that PCP was made aware and felt there was not any treatment options.    ED Course: T 98.7  131/61  HR 55 RR 18. EDP exam notable for Lower abdominal distention and tenderness; LE weakness rated as 3/3 left LE, 4/5 Right LE. Lab: glucose 166, U/A cloudy, many bacteria, 21-50 WBC/hpf. MRI reveals mod-severe spinal stenosis L4-5, L5-S1, C7-T1 severe stenosis, moderate bilateral stenosis T10-11, T11-12. Dr. Maurice Small consulted - recommended MRI c-spine and admit to [facility]. [facility] called to handle admission.   Review of Systems: As per HPI otherwise 10 point review of systems negative. 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 Has two living daughters, one deceased, one son. He has many many grandchildren and many great grands. 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 He and his wife live with their daughtet.   reports that he has quit smoking. He has never used smokeless tobacco. He reports that he does not use drugs. No history on file for alcohol use.  No Known Allergies  History reviewed. No pertinent family history.   Prior to Admission medications   Medication Sig Start Date End Date Taking? Authorizing Provider  acetaminophen (TYLENOL) 500 MG tablet 1 tablet as needed    [provider]  albuterol (VENTOLIN HFA) 108 (90 Base) MCG/ACT inhaler 2 puffs as needed 08/04/19   [provider]  Ascorbic Acid (VITAMIN C) 1000 MG tablet 1 tablet    [provider]  aspirin (ASPIR-LOW) 81 MG EC tablet 1 tablet    [provider]  aspirin EC 81 MG tablet Take 1 tablet (81 mg total) by mouth daily. 08/09/17  Deeann Saint, MD  atorvastatin (LIPITOR) 40 MG tablet 1 tablet    [provider]  atorvastatin (LIPITOR) 40 MG tablet Take 1 tablet by mouth daily.    [provider]  brimonidine (ALPHAGAN) 0.2 % ophthalmic solution INSTILL ONE DROP INTO THE RIGHT EYE TWICE DAILY 12/23/17   [provider]  Dextromethorphan-guaiFENesin (MUCINEX DM MAXIMUM STRENGTH) 60-1200 MG TB12 1 tablet as needed 08/04/19   [provider]  dorzolamide (TRUSOPT) 2 % ophthalmic solution INSTILL 1 DROP TWICE A DAY INTO RIGHT EYE 10/22/17   [provider]   latanoprost (XALATAN) 0.005 % ophthalmic solution  11/22/17   [provider]  lisinopril (ZESTRIL) 10 MG tablet 1 tablet    [provider]  lisinopril (ZESTRIL) 20 MG tablet 1 tablet    [provider]  loratadine (CLARITIN) 10 MG tablet 1 tablet 08/04/19   [provider]  metFORMIN (GLUCOPHAGE) 500 MG tablet Take 1 tablet by mouth 2 (two) times daily with a meal.    [provider]  metFORMIN (GLUCOPHAGE) 500 MG tablet 1 tablet with a meal    [provider]  metoprolol tartrate (LOPRESSOR) 25 MG tablet Take 1 tablet by mouth 2 (two) times daily with a meal.    [provider]  Multiple Vitamins-Minerals (CENTRUM SILVER) tablet 1 tablet    [provider]  pantoprazole (PROTONIX) 40 MG tablet Take 1 tablet by mouth daily.    [provider]  tamsulosin (FLOMAX) 0.4 MG CAPS capsule Take 1 capsule by mouth 2 (two) times daily.    [provider]    Physical Exam: Vitals:   11/25/20 1230 11/25/20 1300 11/25/20 1330 11/25/20 1400  BP: 113/62 (!) 117/52 (!) 118/59 131/61  Pulse: 61 (!) 55 (!) 54 (!) 55  Resp:  18  18  Temp:      TempSrc:      SpO2: 97% 97% 98% 98%  Weight:      Height:         Vitals:   11/25/20 1230 11/25/20 1300 11/25/20 1330 11/25/20 1400  BP: 113/62 (!) 117/52 (!) 118/59 131/61  Pulse: 61 (!) 55 (!) 54 (!) 55  Resp:  18  18  Temp:      TempSrc:      SpO2: 97% 97% 98% 98%  Weight:      Height:       General: WNWD man in no distress Eyes: PERRL, lids normal, conjunctiva muddy, mild arcus senilis. Poor vision ENMT: Mucous membranes are moist. Posterior pharynx clear of any exudate or lesions.Normal dentition.  Neck: normal, supple, no masses, no thyromegaly Respiratory: clear to auscultation bilaterally, no wheezing, no crackles. Normal respiratory effort. No accessory muscle use.  Cardiovascular: Regular rate and rhythm, no murmurs / rubs / gallops. No extremity edema.  trace pedal pulses. Feet are cool to touch. No carotid bruits.  Abdomen: no tenderness, no masses palpated. No hepatosplenomegaly. Bowel sounds positive.  Musculoskeletal: no clubbing / cyanosis.  joint deformity left knee with scar from arthroscopy.  Fairly good ROM, no contractures. Normal muscle tone.  Skin: no rashes, lesions, ulcers. No induration Neurologic: CN 2-12 grossly intact. Sensation intact to light touch, DTR 2+ at patellar tendons. Strength: Proximal LE weakness right - unable to elevate leg independently, good distal strength - with straight leg and bent leg.  MIld proximal weakness left leg - can lift off the bed but cannot keep elevated with pressure. Distal strength diminished - little  resistence to straightening flexed knee or maintaining straight leg against pressure.  Psychiatric: Normal judgment and insight. Alert and oriented x 3. Normal mood.     Labs on Admission: I have personally reviewed following labs and imaging studies  CBC: Recent Labs  Lab 11/25/20 0856  WBC 7.0  NEUTROABS 5.6  HGB 13.7  HCT 41.7  MCV 82.9  PLT 130*   Basic Metabolic Panel: Recent Labs  Lab 11/25/20 0856  NA 137  K 3.9  CL 104  CO2 24  GLUCOSE 166*  BUN 21  CREATININE 0.90  CALCIUM 9.2   GFR: Estimated Creatinine Clearance: 68.9 mL/min (by C-G formula based on SCr of 0.9 mg/dL). Liver Function Tests: No results for input(s): AST, ALT, ALKPHOS, BILITOT, PROT, ALBUMIN in the last 168 hours. No results for input(s): LIPASE, AMYLASE in the last 168 hours. No results for input(s): AMMONIA in the last 168 hours. Coagulation Profile: No results for input(s): INR, PROTIME in the last 168 hours. Cardiac Enzymes: No results for input(s): CKTOTAL, CKMB, CKMBINDEX, TROPONINI in the last 168 hours. BNP (last 3 results) No results for input(s): PROBNP in the last 8760 hours. HbA1C: No results for input(s): HGBA1C in the last 72 hours. CBG: No results for input(s): GLUCAP in the  last 168 hours. Lipid Profile: No results for input(s): CHOL, HDL, LDLCALC, TRIG, CHOLHDL, LDLDIRECT in the last 72 hours. Thyroid Function Tests: No results for input(s): TSH, T4TOTAL, FREET4, T3FREE, THYROIDAB in the last 72 hours. Anemia Panel: No results for input(s): VITAMINB12, FOLATE, FERRITIN, TIBC, IRON, RETICCTPCT in the last 72 hours. Urine analysis:    Component Value Date/Time   COLORURINE YELLOW (A) 11/25/2020 0856   APPEARANCEUR CLOUDY (A) 11/25/2020 0856   LABSPEC 1.015 11/25/2020 0856   PHURINE 6.0 11/25/2020 0856   GLUCOSEU NEGATIVE 11/25/2020 0856   HGBUR LARGE (A) 11/25/2020 0856   BILIRUBINUR NEGATIVE 11/25/2020 0856   KETONESUR NEGATIVE 11/25/2020 0856   PROTEINUR NEGATIVE 11/25/2020 0856   NITRITE NEGATIVE 11/25/2020 0856   LEUKOCYTESUR MODERATE (A) 11/25/2020 0856    Radiological Exams on Admission: MR THORACIC SPINE WO CONTRAST  Result Date: 11/25/2020 CLINICAL DATA:  Mid-back pain, neuro deficit EXAM: MRI THORACIC SPINE WITHOUT CONTRAST TECHNIQUE: Multiplanar, multisequence MR imaging of the thoracic spine was performed. No intravenous contrast was administered. COMPARISON:  None. FINDINGS: Alignment:  Normal. Vertebrae: Vertebral body heights are maintained. No focal marrow edema to suggest acute fracture or discitis/osteomyelitis. No suspicious bone lesions. Cord: No clear cord signal abnormality; however, the cervical cord is poorly evaluated at C7-T1 in the region of stenosis. Paraspinal and other soft tissues: Likely trace bilateral pleural effusions. Disc levels: At C7-T1 there is posterior disc osteophyte complex with ligamentum flavum thickening and likely severe canal stenosis, which is partially imaged. Small disc bulges at T2-T3, T5-T6 and T7-T8 with ligamentum flavum thickening and mild canal stenosis. Multilevel facet hypertrophy with moderate bilateral foraminal stenosis at T10-T11 and T11-T12, greatest on the left at T10-T11. Mild to moderate right  foraminal stenosis at T9-T10. Otherwise, multilevel mild foraminal stenosis in the thoracic spine. IMPRESSION: 1. At C7-T1, likely severe canal stenosis that is partially imaged. Probable foraminal stenosis as well, poorly evaluated. Recommend MRI of the cervical spine to further evaluate. 2. Moderate bilateral foraminal stenosis at T10-T11 and T11-T12, greatest on the left at T10-T11. Mild to moderate right foraminal stenosis at T9-T10 and multilevel mild foraminal stenosis. 3. Mild multilevel canal stenosis in the thoracic spine. Electronically Signed   By: Thornton Dales  Yetta BarreJones M.D.   On: 11/25/2020 12:20   MR LUMBAR SPINE WO CONTRAST  Result Date: 11/25/2020 CLINICAL DATA:  Low back pain, cauda equina syndrome suspected EXAM: MRI LUMBAR SPINE WITHOUT CONTRAST TECHNIQUE: Multiplanar, multisequence MR imaging of the lumbar spine was performed. No intravenous contrast was administered. COMPARISON:  None. FINDINGS: Segmentation:  Standard. Alignment: 4 mm grade 1 anterolisthesis L4 on L5. Slight retrolisthesis of L5 on S1. Vertebrae: No fracture, evidence of discitis, or bone lesion. The L2 and L3 vertebral bodies are partially fused anteriorly. Diffuse intrinsic canal narrowing on the basis of congenitally short pedicles. Conus medullaris and cauda equina: Conus extends to the L1-2 level. Conus and cauda equina appear normal. Paraspinal and other soft tissues: Negative. Disc levels: T12-L1: No disc protrusion. Unremarkable facets. No foraminal or canal stenosis. L1-L2: Mild diffuse disc bulge. Mild bilateral facet arthropathy and ligamentum flavum buckling. Findings contribute to moderate canal stenosis with moderate to severe bilateral foraminal stenosis, left worse than right. L2-L3: Mild endplate ridging. Bilateral facet arthropathy and mild ligamentum flavum buckling. Findings result in mild-to-moderate canal stenosis with mild bilateral foraminal stenosis. L3-L4: Diffuse disc osteophyte complex with right  paracentral disc protrusion. Moderate bilateral facet arthropathy and ligamentum flavum buckling. Findings result in severe canal stenosis with moderate bilateral foraminal stenosis, right worse than left. L4-L5: Diffuse disc bulge and endplate ridging. Right worse than left facet arthropathy. Moderate to severe canal stenosis with mild-to-moderate bilateral foraminal stenosis. L5-S1: Diffuse disc bulge and endplate ridging with moderate bilateral facet arthropathy. Findings result in moderate to severe canal stenosis with severe bilateral foraminal stenosis. IMPRESSION: 1. Advanced multilevel degenerative changes of the lumbar spine superimposed on a congenitally narrow canal. Findings are most pronounced at the L3-4 level where there is severe canal stenosis and moderate bilateral foraminal stenosis. 2. Moderate-to-severe canal stenosis at L4-5 and L5-S1. 3. Severe bilateral foraminal stenosis at L5-S1. Electronically Signed   By: Duanne GuessNicholas  Plundo D.O.   On: 11/25/2020 12:24    EKG: Independently reviewed. No EKG on chart  Assessment/Plan Active Problems:   Acute lower UTI   Acute urinary retention   Spinal stenosis of lumbar region at multiple levels   Spinal stenosis, cervicothoracic region   Benign prostatic hyperplasia without lower urinary tract symptoms   Diabetic peripheral neuropathy associated with type 2 diabetes mellitus (HCC)   Essential hypertension   Gastroesophageal reflux disease without esophagitis  Spinal stenosis - patient with progressive leg weakness over several months to the point of being bed-bound. On exam there is mild to moderate LE weakness. Sensation preserved and DTR at patellar tendon preserved. MRI reveals severe spinal stenosis L5-S1, mod-severe stenosis L4-5. Thoracic MRI with moderate bilateral stenosis T10-11, T11-12. Question of severe stenosis C7-T1. Cervical MRI pending. Patient did have urinary retention but also has BPH. [facility]  NS-consult Dr. Maurice Smallstergard  If not NS problem may need neurology consult  PT/OT eval  2. Urology - patient with BPH on tamsulosin which has controlled his symptoms up to now. Plan Continue tamsulosin  Monitory UOP, if less than 100 cc over 4 hrs will need bladder scan  3. Acute UTI - mild pyuria, no leukocytosis, no fever. Received 1 g Rocephin in ED Plan Start Cipro 250 mg bid 11/26/20  4. Diabetes - last A1C 2019. Nl renal function Plan Continue metformin  A1C  SS coverage  5. HTN- continue home meds   DVT prophylaxis: heparin  Code Status: full code  Family Communication: Daughter present during  interview and exam. Had no questions.  Disposition Plan: TBD  Consults called: NS - Dr. Maurice Small  Admission status: inpatient    Illene Regulus MD [facility] Pager [phone]  If 7PM-7AM, please contact night-coverage [email or web address] Password [email or web address]  11/25/2020, 3:16 PM

## 2020-11-25 NOTE — ED Triage Notes (Signed)
Pt presents to ED via ems cc bilateral flank pain, urine retention, and constipation. Pt states not being able to void urine or have a bm for the past 3 days. Pt reports hx of BPH, urine retention which required a catheter 5 years ago. Pt A&ox4. Respirations equal, unlabored. A&Ox4.

## 2020-11-25 NOTE — ED Notes (Signed)
[facility] called for transport. 

## 2020-11-25 NOTE — Consult Note (Signed)
Neurosurgery Consultation  Reason for Consult: Leg weakness Referring Physician: Norins  CC: Leg weakness  HPI: This is a 85 y.o. man that presents with progressive loss of ambulation. He is blind in both eyes, he reports due to trauma, EMR states it's due to T2DM, states he has had this for years and his balance was still good until roughly 6 months ago. His balance has worsened, he has started to drag his right leg, and he feels it gives out on him when he stands up. He has some baseline neuropathy symptoms in both feet with numbness that's symmetric in a stocking distribution with dysesthesias / paresthesias that are worse at night and have been slowly progressive over years. He came to the ED today because he was acutely unable to urinate, foley inserted in the ED with over a liter in his bladder, no change in bowel control but he does report being constipated. When he urinates / defecates, in the past few months, he has normal sensation / control. No trauma, no acute change in function along the same timeline as the urinary retention.    ROS: A 14 point ROS was performed and is negative except as noted in the HPI.   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 No pertinent family history. SocHx:  reports that he has quit smoking. He has never used smokeless tobacco. He reports that he does not use drugs. No history on file for alcohol use.  Exam: Vital signs in last 24 hours: Temp:  [98.7 F (37.1 C)] 98.7 F (37.1 C) (09/09 0823) Pulse Rate:  [50-94] 94 (09/09 1911) Resp:  [0-23] 16 (09/09 1911) BP: (91-152)/(38-82) 124/63 (09/09 1911) SpO2:  [92 %-100 %] 98 % (09/09 1911) Weight:  [89.8 kg] 89.8 kg (09/09 0823) General: Awake,  alert, cooperative, lying in bed in NAD Head: Normocephalic and atruamatic HEENT: Neck supple Pulmonary: breathing room air comfortably, no evidence of increased work of breathing Cardiac: RRR Abdomen: S NT ND Extremities: Warm and well perfused x4, some mild chronic ischemic changes with decreased DP/Pts in both feet Neuro: AOx3, decreased visual acuity OU, face symmetric, no obvious tongue fasciculations Strength 4+ to 5/5 in BUE BLE with increased tone bilaterally, RLE is 1-2/5 diffusely, LLE is 3/5 to 4-/5 diffusely, reflexes 1+ in BUE, 3+ at the right patella, 1+ at the left patella, no hoffman's, no ankle clonus   Assessment and Plan: 85 y.o. man with progressive BLE weakness and loss of ambulation for the past 6 months or so. Exam most notable unilateral lower extremity monoparesis with increased tone, upper extremities spared. Exam best localizes to the thoracic spine at this point, concerning for thoracic myelopathy. MRI T/L spine personally reviewed, which show diffuse degen changes in the L-spine with severe canal stenosis at L3-4, less severe at L4-5 and L5-S1 with foraminal stenosis worst at L5-S1. T-spine shows some foraminal stenosis but no clinically significant canal stenosis. At the upper end of the sagittal images, there is suggestion of severe stenosis at C7-T1.  -recommend MRI C-spine to better visualize the C7-T1 segment, which could potentially cause this picture but it would be, in my experience, an atypical presentation of cervicothoracic myelopathy to  be so unilateral this high in the thoracic spine, but certainly a common presentation further down. If so, surgical decompression would be warranted. If it is unremarkable, will have to discuss further but likely will need consultation with neurology to evaluate for potential non-surgical pathologies that could present this type of picture. -please call with any concerns or questions  Jadene Pierini, MD 11/25/20 7:40  PM [facility]

## 2020-11-25 NOTE — ED Notes (Signed)
Pt is not In the room

## 2020-11-26 ENCOUNTER — Inpatient Hospital Stay (HOSPITAL_COMMUNITY): Payer: Medicare HMO

## 2020-11-26 LAB — BASIC METABOLIC PANEL
Anion gap: 9 (ref 5–15)
BUN: 15 mg/dL (ref 8–23)
CO2: 24 mmol/L (ref 22–32)
Calcium: 9 mg/dL (ref 8.9–10.3)
Chloride: 106 mmol/L (ref 98–111)
Creatinine, Ser: 0.85 mg/dL (ref 0.61–1.24)
GFR, Estimated: >60 mL/min (ref >60)
Glucose, Bld: 139 mg/dL — ABNORMAL HIGH (ref 70–99)
Potassium: 3.9 mmol/L (ref 3.5–5.1)
Sodium: 139 mmol/L (ref 135–145)

## 2020-11-26 LAB — GLUCOSE, CAPILLARY
Glucose-Capillary: 139 mg/dL — ABNORMAL HIGH (ref 70–99)
Glucose-Capillary: 159 mg/dL — ABNORMAL HIGH (ref 70–99)
Glucose-Capillary: 242 mg/dL — ABNORMAL HIGH (ref 70–99)
Glucose-Capillary: 186 mg/dL — ABNORMAL HIGH (ref 70–99)

## 2020-11-26 LAB — SARS CORONAVIRUS 2 (TAT 6-24 HRS): SARS Coronavirus 2: NEGATIVE

## 2020-11-26 LAB — HEMOGLOBIN A1C
Hgb A1c MFr Bld: 6.3 % — ABNORMAL HIGH (ref 4.8–5.6)
Mean Plasma Glucose: 134.11 mg/dL

## 2020-11-26 IMAGING — CT CT CERVICAL SPINE W/O CM
3 of 4 series · 12 of 35 positions shown, 14 images · non-contrast
Comparison: Total spine MRI yesterday.

CLINICAL DATA: 85-year-old male with lower extremity weakness.
C7-T1 spinal stenosis with cord mass effect and possible
myelomalacia. Preoperative exam.

EXAM:
CT CERVICAL SPINE WITHOUT CONTRAST
TECHNIQUE: Multidetector CT imaging of the cervical spine was performed without
intravenous contrast. Multiplanar CT image reconstructions were also
generated.

[Series 4: c_spine 2.0 st · axial · 0.35mm/px · z∈[-460,-362]mm · 4 of 75 slices shown, 5 images]
[im 13/75  soft-tissue]
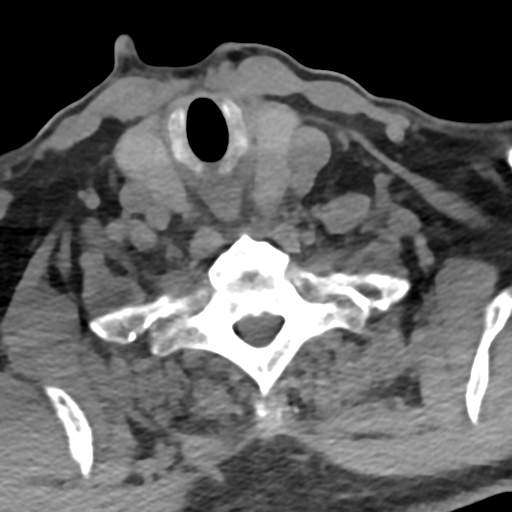
[im 13/75  bone]
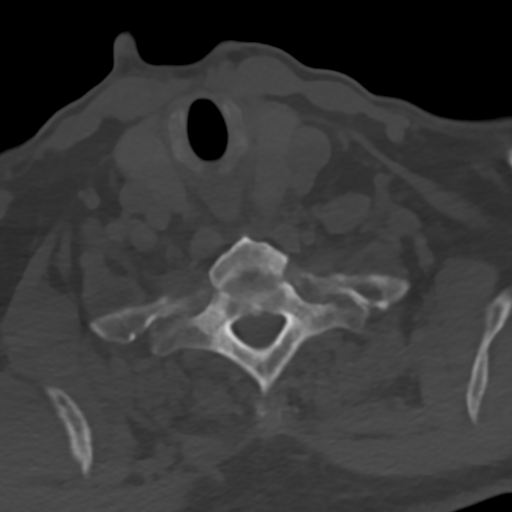
[im 25/75  bone]
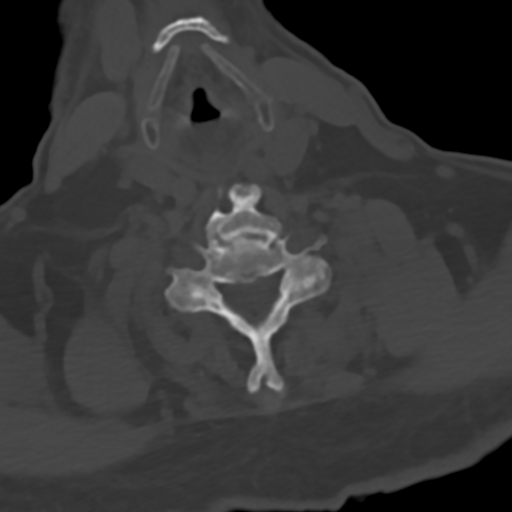
[im 50/75  bone]
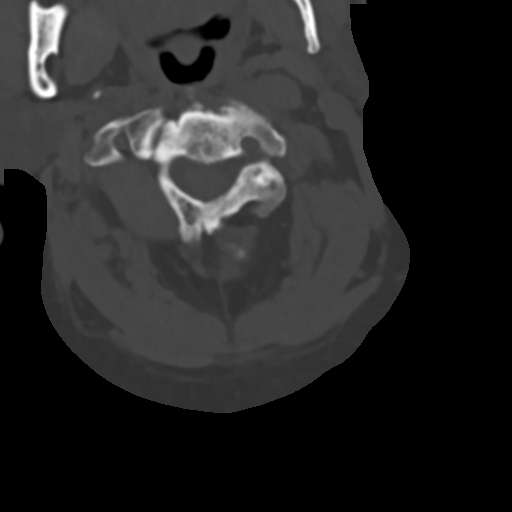
[im 62/75  bone]
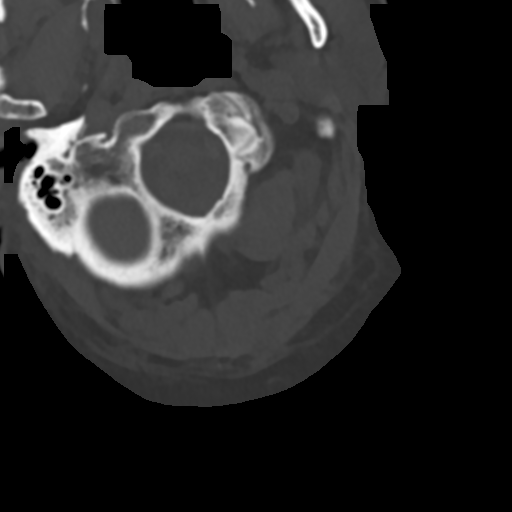

[Series 8: c_spine 2.0 sag bone · sagittal · 0.33mm/px · 5 of 61 slices shown, 6 images]
[im 21/61  bone]
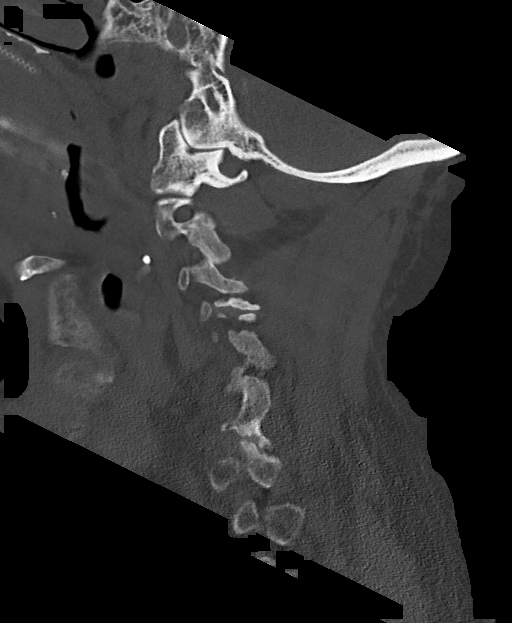
[im 26/61  bone]
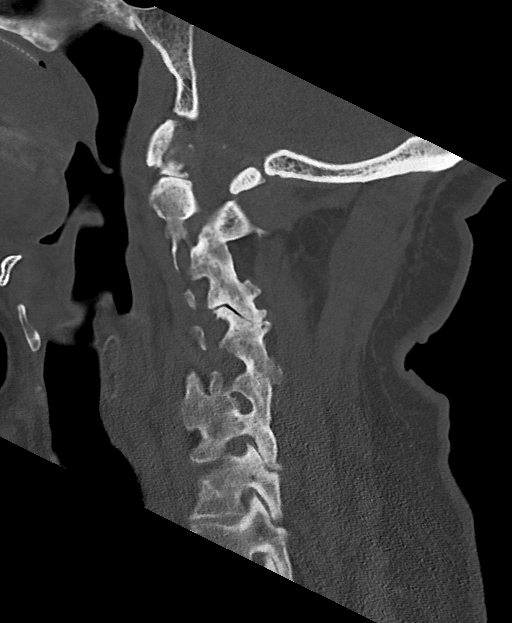
[im 31/61  soft-tissue]
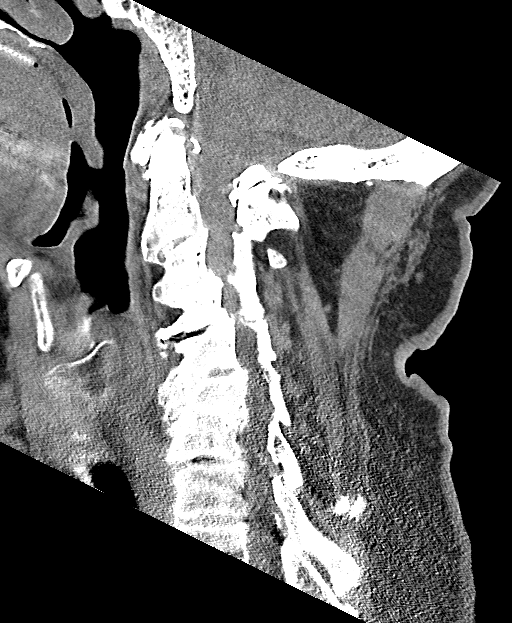
[im 31/61  bone]
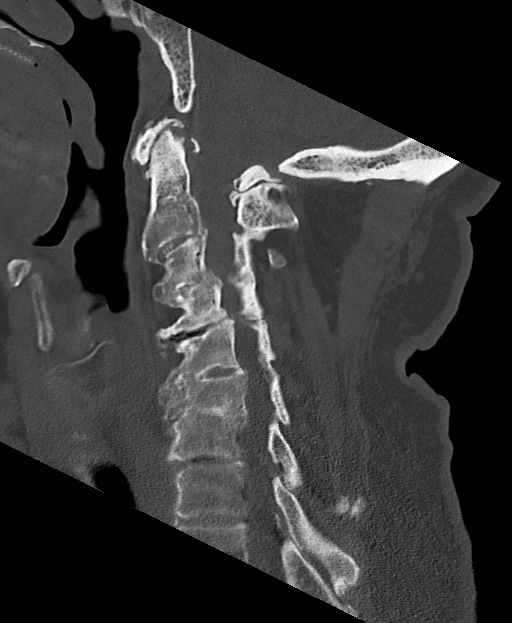
[im 36/61  bone]
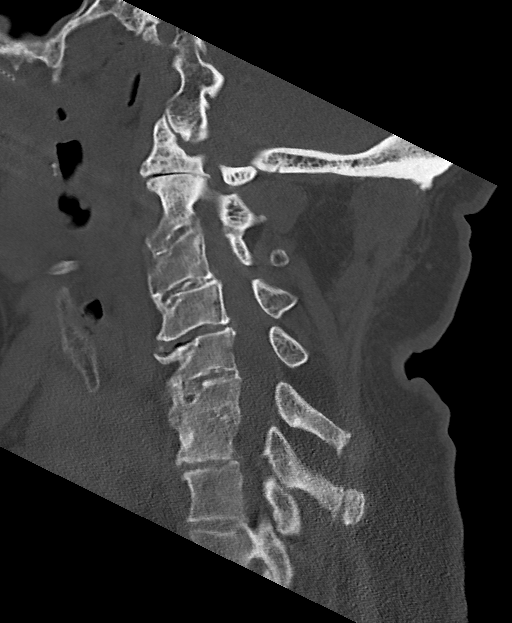
[im 41/61  bone]
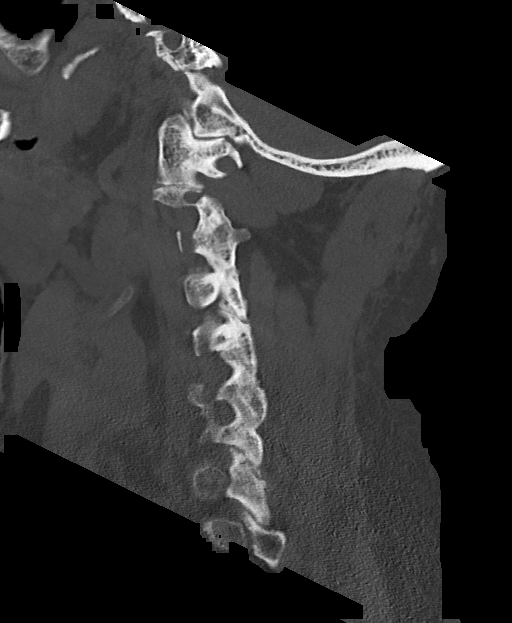

[Series 9: c_spine 2.0 cor bone · coronal · 0.23mm/px · 3 of 61 slices shown]
[im 16/61  bone]
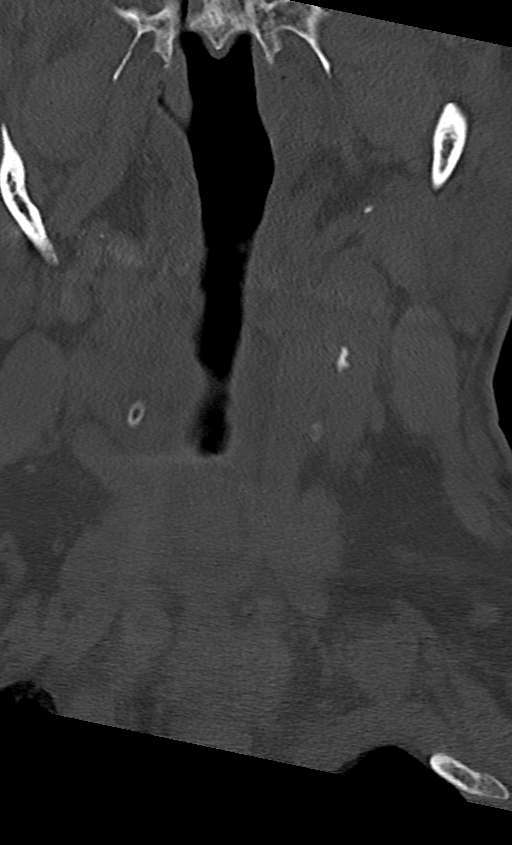
[im 26/61  bone]
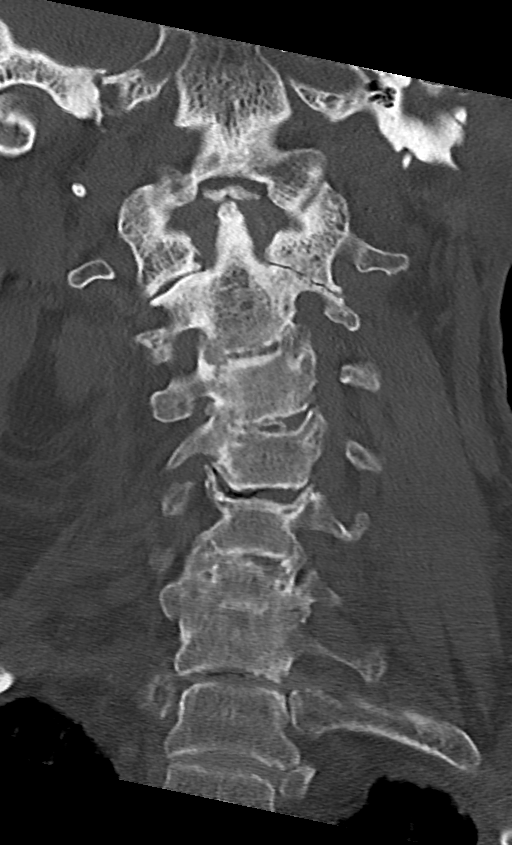
[im 35/61  bone]
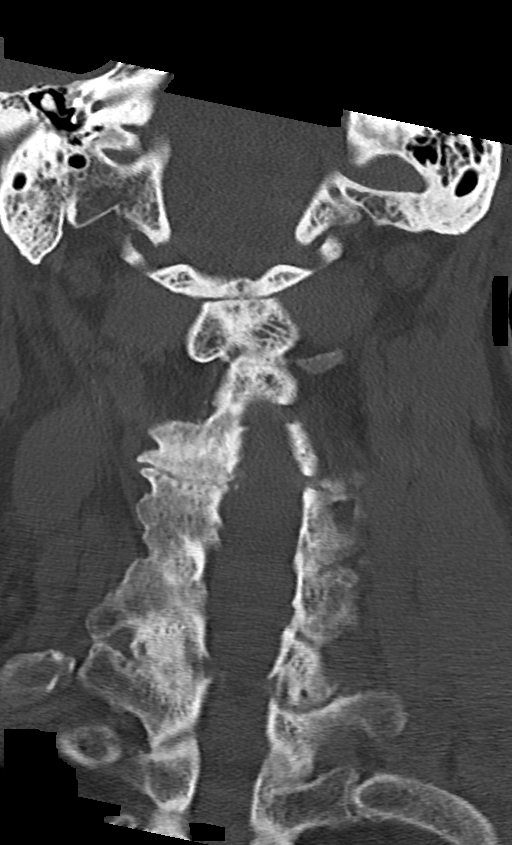

[12 of 35 positions shown; findings below may reference images not displayed]

FINDINGS: Alignment: Moderate levoconvex cervical scoliosis. Straightening of
cervical lordosis. Subtle anterolisthesis of C7 on T1. Bilateral
posterior element alignment is within normal limits.

Skull base and vertebrae: Bone mineralization is within normal
limits for age. Visualized skull base is intact. No
atlanto-occipital dissociation. No acute osseous abnormality
identified.

Soft tissues and spinal canal: Partially retropharyngeal course of
both carotids in the lower neck. Negative visible noncontrast neck
soft tissues.

Disc levels:

C1-C2: Severe left side joint space loss with vacuum phenomena and
bone-on-bone appearance (coronal image 24).

C2-C3:  Solid interbody and posterior element ankylosis.

C3-C4: Subtle anterolisthesis but solid interbody and posterior
element ankylosis.

C4-C5: Vacuum disc and vacuum facet on the right. Severe right side
facet hypertrophy. Bulky disc osteophyte complex. Possible
developing facet ankylosis at this level more so on the left
(sagittal image 42) but no definite bridging bone at this time.

C5-C6:  Interbody and posterior element ankylosis.

C6-C7:  Interbody and posterior element ankylosis.

C7-T1: Mild anterolisthesis. Disc space loss and vacuum disc.
Moderate facet hypertrophy greater on the right.

T1-T2: Mild circumferential disc bulge. Mild facet hypertrophy. No
ankylosis.

Upper chest: Apical emphysema visible on the right.

Other: Negative visible noncontrast posterior fossa.
IMPRESSION: 1. Advanced cervical spine degeneration with ankylosis of C2 through
C4 and C5 through C7.
2. Subsequent severe degeneration at C7-T1 (mild anterolisthesis),
and the other unfused levels C1-C2 (on the left), and C4-C5 (severe
facet arthropathy with questionable developing facet fusion).

3. No visible upper thoracic ankylosis.

## 2020-11-26 MED ORDER — SODIUM CHLORIDE 0.9 % IV SOLN
1.0000 g | Freq: Every day | INTRAVENOUS | Status: AC
  Administered 2020-11-27 – 2020-12-01 (×5): 1 g via INTRAVENOUS
  Filled 2020-11-26 (×5): qty 10

## 2020-11-26 MED ORDER — SODIUM CHLORIDE 0.9 % IV SOLN
1.0000 g | Freq: Once | INTRAVENOUS | Status: AC
  Administered 2020-11-26: 1 g via INTRAVENOUS
  Filled 2020-11-26: qty 10

## 2020-11-26 MED ORDER — CHLORHEXIDINE GLUCONATE CLOTH 2 % EX PADS
6.0000 | MEDICATED_PAD | Freq: Every day | CUTANEOUS | Status: DC
  Administered 2020-11-26 – 2020-12-01 (×6): 6 via TOPICAL

## 2020-11-26 MED ORDER — DEXAMETHASONE SODIUM PHOSPHATE 10 MG/ML IJ SOLN
10.0000 mg | Freq: Once | INTRAMUSCULAR | Status: AC
  Administered 2020-11-26: 10 mg via INTRAVENOUS
  Filled 2020-11-26: qty 1

## 2020-11-26 NOTE — Evaluation (Signed)
Occupational Therapy Evaluation Patient Details Name: Michael Jensen MRN: [id number] DOB: [birth year] Today's Date: 11/26/2020    History of Present Illness 85 y.o. male with medical history significant of hypertension, hyperlipidemia, BPH and diabetes who is presenting today with 3 days of inability to have a bowel movement and complaints of urinary retention.  Patient reports declining mobility and ambulatory status over the last 3 months.  Neurosurgery has been consulted.  See chart for CT results as work up and scans continue.   Clinical Impression   Patient admitted for the diagnosis above.  PTA he lives with his daughter and spouse.  The patient admits to increasing lower extremity weakness and difficulty with basic mobility.  Prior to this, he was independent with all self care, mobility and continues to drive.  He is on active bedrest orders, but RN cleared bed level eval.  Patient found with spilled coffee, and grits all over him, and on his bed linens.  Patient required Mod A for upper body bathing and dressing at bed level, and Mod A to roll side to side to change linens.  Transport in room and patient taken to CT.  OT to follow in the acute setting to maximize functional status.  Currently SNF is recommended, but ongoing testing is occurring, he may be a candidate for CIR.      Follow Up Recommendations  SNF    Equipment Recommendations  Wheelchair cushion (measurements OT);Wheelchair (measurements OT);Tub/shower bench;3 in 1 bedside commode    Recommendations for Other Services       Precautions / Restrictions Precautions Precautions: Fall Precaution Comments: current bed rest order 9/10 Restrictions Weight Bearing Restrictions: No Other Position/Activity Restrictions: lower extremity weakness      Mobility Bed Mobility Overal bed mobility: Needs Assistance Bed Mobility: Rolling Rolling: Mod assist           Patient Response: Flat affect  Transfers                       Balance                                           ADL either performed or assessed with clinical judgement   ADL Overall ADL's : Needs assistance/impaired Eating/Feeding: Set up;Bed level   Grooming: Wash/dry hands;Wash/dry face;Set up;Bed level   Upper Body Bathing: Moderate assistance;Bed level   Lower Body Bathing: Maximal assistance;Bed level   Upper Body Dressing : Moderate assistance;Bed level   Lower Body Dressing: Total assistance;Bed level                 General ADL Comments: bed level eval.     Vision Baseline Vision/History: 2 Legally blind Patient Visual Report: No change from baseline       Perception     Praxis      Pertinent Vitals/Pain Pain Assessment: No/denies pain     Hand Dominance Right   Extremity/Trunk Assessment Upper Extremity Assessment Upper Extremity Assessment: Generalized weakness   Lower Extremity Assessment Lower Extremity Assessment: Defer to PT evaluation   Cervical / Trunk Assessment Cervical / Trunk Assessment: Kyphotic   Communication Communication Communication: No difficulties   Cognition Arousal/Alertness: Awake/alert Behavior During Therapy: WFL for tasks assessed/performed Overall Cognitive Status: Within Functional Limits for tasks assessed  General Comments   VSS on RA               Home Living Family/patient expects to be discharged to:: Private residence Living Arrangements: Children;Spouse/significant other Available Help at Discharge: Family;Available 24 hours/day Type of Home: House Home Access: Level entry     Home Layout: Multi-level;Bed/bath upstairs Alternate Level Stairs-Number of Steps: full flight   Bathroom Shower/Tub: Tub/shower unit;Walk-in shower   Bathroom Toilet: Standard Bathroom Accessibility: Yes How Accessible: Accessible via walker Home Equipment: None          Prior  Functioning/Environment Level of Independence: Independent        Comments: Progressive BLE weakness and loss of ambulation for the past 6 months.  Prior to that Ind with all mobility and self care.        OT Problem List: Decreased strength;Decreased range of motion;Decreased activity tolerance;Impaired balance (sitting and/or standing);Decreased knowledge of use of DME or AE      OT Treatment/Interventions:      OT Goals(Current goals can be found in the care plan section) Acute Rehab OT Goals Patient Stated Goal: I need to move again OT Goal Formulation: With patient Time For Goal Achievement: 12/10/20 Potential to Achieve Goals: Good ADL Goals Pt Will Perform Grooming: with min guard assist;sitting Pt Will Perform Upper Body Bathing: with min guard assist;sitting Pt Will Perform Upper Body Dressing: with min guard assist;sitting Pt/caregiver will Perform Home Exercise Program: Increased strength;Both right and left upper extremity;With Supervision;With written HEP provided Additional ADL Goal #1: Patient will complete rolling side to side with supervison to increase independence with toileting  OT Frequency: Min 2X/week   Barriers to D/C:    none noted       Co-evaluation              AM-PAC OT "6 Clicks" Daily Activity     Outcome Measure Help from another person eating meals?: A Little Help from another person taking care of personal grooming?: A Little Help from another person toileting, which includes using toliet, bedpan, or urinal?: A Lot Help from another person bathing (including washing, rinsing, drying)?: A Lot Help from another person to put on and taking off regular upper body clothing?: A Lot Help from another person to put on and taking off regular lower body clothing?: A Lot 6 Click Score: 14   End of Session    Activity Tolerance: Treatment limited secondary to medical complications (Comment) Patient left: in bed;with nursing/sitter in  room;Other (comment) (patient being transported for CT scan)  OT Visit Diagnosis: Unsteadiness on feet (R26.81);Muscle weakness (generalized) (M62.81)                Time: 1035-1050 OT Time Calculation (min): 15 min Charges:  OT General Charges $OT Visit: 1 Visit OT Evaluation $OT Eval Moderate Complexity: 1 Mod  11/26/2020  RP, OTR/L  Acute Rehabilitation Services  Office:  [phone]   Suzanna Obey 11/26/2020, 11:34 AM

## 2020-11-26 NOTE — Progress Notes (Signed)
Neurosurgery Service Progress Note  Subjective: No acute events overnight. No new complaitns   Objective: Vitals:   11/25/20 1911 11/25/20 2100 11/25/20 2310 11/26/20 0820  BP: 124/63 125/62 128/76 124/67  Pulse: 94 63 (!) 45 67  Resp: 16 15 17 16   Temp:   98.2 F (36.8 C) 98.4 F (36.9 C)  TempSrc:   Oral Oral  SpO2: 98% 100% 99% 97%  Weight:      Height:        Physical Exam: Strength 4+ to 5/5 in BUE BLE with increased tone bilaterally, RLE is 1-2/5 diffusely, LLE is 3/5 to 4-/5 diffusely, reflexes 1+ in BUE, 3+ at the right patella, 1+ at the left patella  Assessment & Plan: 85 y.o. man w/ progressive BLE weakness, R>L. 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  -discussed w/ the pt, cord signal change at C7-T1 could be the cause of his weakness, will require surgical correction in the form of an ACDF. I can perform this on Monday 9/12 in the afternoon, he would like to proceed  11/12  11/26/20 8:59 AM

## 2020-11-26 NOTE — Plan of Care (Signed)
  Problem: Education: Goal: Knowledge of General Education information will improve Description Including pain rating scale, medication(s)/side effects and non-pharmacologic comfort measures Outcome: Progressing   Problem: Health Behavior/Discharge Planning: Goal: Ability to manage health-related needs will improve Outcome: Progressing   

## 2020-11-26 NOTE — Plan of Care (Signed)
  Problem: Education: Goal: Knowledge of General Education information will improve Description Including pain rating scale, medication(s)/side effects and non-pharmacologic comfort measures Outcome: Progressing   

## 2020-11-26 NOTE — Progress Notes (Signed)
PROGRESS NOTE    Michael Jensen  GEZ:[id number] DOB: 08-18-1935 DOA: 11/25/2020 PCP: Soundra Pilon, FNP     Chief Complaint  Patient presents with   urine retention   Flank Pain   Constipation    Brief Narrative:   HPI: Michael Jensen is a 85 y.o. male with medical history significant of hypertension, hyperlipidemia, BPH and diabetes who is presenting today with 3 days of inability to have a bowel movement and complaints of urinary retention.  He is reporting significant pressure in the bladder area that then radiates into his bilateral flanks.  He denies fever, nausea or vomiting.  He has continued to eat.  He reports having symptoms like this in the past that required a catheter approximately 5 years ago.  He denies any recent medication changes.  The pain is significant and 8 out of 10.  It is made worse with movement or palpation.  He has frequency and urgency but no urine comes out.  He reports it is not unusual for him to have a bowel movement every 2 to 3 days but his urinary symptoms are new.  He denies any cough, congestion, shortness of breath or chest pain.     ED Course: T 98.7  131/61  HR 55 RR 18. EDP exam notable for Lower abdominal distention and tenderness; LE weakness rated as 3/3 left LE, 4/5 Right LE. Lab: glucose 166, U/A cloudy, many bacteria, 21-50 WBC/hpf. MRI reveals mod-severe spinal stenosis L4-5, L5-S1, C7-T1 severe stenosis, moderate bilateral stenosis T10-11, T11-12. Dr. Maurice Small consulted - recommended MRI c-spine and admit to [facility]. [facility] called to handle admission.     Assessment & Plan:   Active Problems:   Benign prostatic hyperplasia without lower urinary tract symptoms   Diabetic peripheral neuropathy associated with type 2 diabetes mellitus (HCC)   Essential hypertension   Gastroesophageal reflux disease without esophagitis   Acute lower UTI   Acute urinary retention   Spinal stenosis of lumbar region at multiple levels   Spinal stenosis,  cervicothoracic region   Spinal stenosis/bilateral weakness right > left -Neurosurgery input greatly appreciated, MRI cervical/thoracic/lumbar spine notable for significant degenerative disease with stenosis at multiple level, but worst at C7-T1, where severe stenosis causes some cord signal abnormalities. -Plan for surgical correction in the form of ACDF, as current symptoms may be related to C7/T1 cord spinal stenosis -Discussed with neurosurgery, no need for bed rest, will consult PT/OT, out of bed, no need for steroids currently, will keep DVT prophylaxis going till tomorrow evening as surgery is anticipated Monday morning.   Urinary retention -May be multifactorial in the setting of BPH, UTI, as well possibly his spinal stenosis contributing to this finding as discussed with neurosurgery. -Continue with tamsulosin   Acute UTI  -Continue with Rocephin   Diabetes - last A1C 2019. Nl renal function Plan     Continue metformin             A1C             SS coverage   HTN- continue home meds  DVT prophylaxis: Heparin Code Status: Full Family Communication: none at bedside Disposition:   Status is: Inpatient  Remains inpatient appropriate because:IV treatments appropriate due to intensity of illness or inability to take PO  Dispo: The patient is from: Home              Anticipated d/c is to: Home  Patient currently is not medically stable to d/c.   Difficult to place patient No       Consultants:  Neurosurgery  Subjective: Patient does report generalized weakness, no significant events overnight  Objective: Vitals:   11/25/20 2100 11/25/20 2310 11/26/20 0820 11/26/20 1210  BP: 125/62 128/76 124/67 122/66  Pulse: 63 (!) 45 67 60  Resp: 15 17 16 18   Temp:  98.2 F (36.8 C) 98.4 F (36.9 C) 98.3 F (36.8 C)  TempSrc:  Oral Oral Oral  SpO2: 100% 99% 97% 96%  Weight:      Height:       No intake or output data in the 24 hours ending 11/26/20  1353 Filed Weights   11/25/20 0823  Weight: 89.8 kg    Examination:  Awake Alert, Oriented X 3, No new F.N deficits, Normal affect frail,  Symmetrical Chest wall movement, Good air movement bilaterally, CTAB RRR,No Gallops,Rubs or new Murmurs, No Parasternal Heave +ve B.Sounds, Abd Soft, No tenderness, No rebound - guarding or rigidity. No Cyanosis, Clubbing or edema, No new Rash or bruise      Data Reviewed: I have personally reviewed following labs and imaging studies  CBC: Recent Labs  Lab 11/25/20 0856  WBC 7.0  NEUTROABS 5.6  HGB 13.7  HCT 41.7  MCV 82.9  PLT 130*    Basic Metabolic Panel: Recent Labs  Lab 11/25/20 0856 11/26/20 0051  NA 137 139  K 3.9 3.9  CL 104 106  CO2 24 24  GLUCOSE 166* 139*  BUN 21 15  CREATININE 0.90 0.85  CALCIUM 9.2 9.0    GFR: Estimated Creatinine Clearance: 72.9 mL/min (by C-G formula based on SCr of 0.85 mg/dL).  Liver Function Tests: No results for input(s): AST, ALT, ALKPHOS, BILITOT, PROT, ALBUMIN in the last 168 hours.  CBG: Recent Labs  Lab 11/25/20 1651 11/25/20 1902 11/25/20 2139 11/26/20 0817 11/26/20 1209  GLUCAP 144* 217* 168* 139* 159*     Recent Results (from the past 240 hour(s))  Urine Culture     Status: Abnormal (Preliminary result)   Collection Time: 11/25/20  8:56 AM   Specimen: Urine, Catheterized  Result Value Ref Range Status   Specimen Description   Final    URINE, CATHETERIZED Performed at [facility], [address], [place], [place]    Special Requests   Final    NONE Performed at [facility], [address], [place], [place]    Culture (A)  Final    >=100,000 COLONIES/mL GRAM NEGATIVE RODS SUSCEPTIBILITIES TO FOLLOW CULTURE REINCUBATED FOR BETTER GROWTH Performed at [facility], [address], [place], [place]    Report Status PENDING  Incomplete  SARS CORONAVIRUS 2 (TAT 6-24 HRS) Nasopharyngeal  Nasopharyngeal Swab     Status: None   Collection Time: 11/25/20  4:53 PM   Specimen: Nasopharyngeal Swab  Result Value Ref Range Status   SARS Coronavirus 2 NEGATIVE NEGATIVE Final    Comment: (NOTE) SARS-CoV-2 target nucleic acids are NOT DETECTED.  The SARS-CoV-2 RNA is generally detectable in upper and lower respiratory specimens during the acute phase of infection. Negative results do not preclude SARS-CoV-2 infection, do not rule out co-infections with other pathogens, and should not be used as the sole basis for treatment or other patient management decisions. Negative results must be combined with clinical observations, patient history, and epidemiological information. The expected result is Negative.  Fact Sheet for Patients:  HairSlick.nohttps://www.fda.gov/media/138098/download  Fact Sheet for Healthcare Providers: quierodirigir.comhttps://www.fda.gov/media/138095/download  This test is not yet approved or cleared by the [place] FDA and  has been authorized for detection and/or diagnosis of SARS-CoV-2 by FDA under an Emergency Use Authorization (EUA). This EUA will remain  in effect (meaning this test can be used) for the duration of the COVID-19 declaration under Se ction 564(b)(1) of the Act, 21 U.S.C. section 360bbb-3(b)(1), unless the authorization is terminated or revoked sooner.  Performed at [facility], [address] [place], [place] [address]          Radiology Studies: CT CERVICAL SPINE WO CONTRAST  Result Date: 11/26/2020 CLINICAL DATA:  85 year old male with lower extremity weakness. C7-T1 spinal stenosis with cord mass effect and possible myelomalacia. Preoperative exam. EXAM: CT CERVICAL SPINE WITHOUT CONTRAST TECHNIQUE: Multidetector CT imaging of the cervical spine was performed without intravenous contrast. Multiplanar CT image reconstructions were also generated. COMPARISON:  Total spine MRI yesterday. FINDINGS: Alignment: Moderate levoconvex cervical  scoliosis. Straightening of cervical lordosis. Subtle anterolisthesis of C7 on T1. Bilateral posterior element alignment is within normal limits. Skull base and vertebrae: Bone mineralization is within normal limits for age. Visualized skull base is intact. No atlanto-occipital dissociation. No acute osseous abnormality identified. Soft tissues and spinal canal: Partially retropharyngeal course of both carotids in the lower neck. Negative visible noncontrast neck soft tissues. Disc levels: C1-C2: Severe left side joint space loss with vacuum phenomena and bone-on-bone appearance (coronal image 24). C2-C3:  Solid interbody and posterior element ankylosis. C3-C4: Subtle anterolisthesis but solid interbody and posterior element ankylosis. C4-C5: Vacuum disc and vacuum facet on the right. Severe right side facet hypertrophy. Bulky disc osteophyte complex. Possible developing facet ankylosis at this level more so on the left (sagittal image 42) but no definite bridging bone at this time. C5-C6:  Interbody and posterior element ankylosis. C6-C7:  Interbody and posterior element ankylosis. C7-T1: Mild anterolisthesis. Disc space loss and vacuum disc. Moderate facet hypertrophy greater on the right. T1-T2: Mild circumferential disc bulge. Mild facet hypertrophy. No ankylosis. Upper chest: Apical emphysema visible on the right. Other: Negative visible noncontrast posterior fossa. IMPRESSION: 1. 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 2. Subsequent severe degeneration at C7-T1 (mild anterolisthesis), and the other unfused levels C1-C2 (on the left), and C4-C5 (severe facet arthropathy with questionable developing facet fusion). 3. No visible upper thoracic ankylosis. Electronically Signed   By: Odessa FlemingH  Hall M.D.   On: 11/26/2020 11:10   MR Cervical Spine Wo Contrast  Result Date: 11/25/2020 CLINICAL DATA:  Motor neuron disease abnormal c7 on thoracic image and lower ext weakness  EXAM: MRI CERVICAL SPINE WITHOUT CONTRAST TECHNIQUE: Multiplanar, multisequence MR imaging of the cervical spine was performed. No intravenous contrast was administered. COMPARISON:  None. FINDINGS: Alignment: Reversal of the normal cervical lordosis. Approximately 3 mm of anterolisthesis of C3 on C4 and C4 on C5. Vertebrae: Multilevel degenerative/discogenic endplate signal changes. No specific evidence of acute fracture or discitis/osteomyelitis. Cord: Motion limited evaluation with possible slight T2 hyperintensity of the cord at C7-T1. Posterior Fossa, vertebral arteries, paraspinal tissues: Diminished right vertebral artery flow void. Left vertebral artery flow void is maintained. No evidence of acute abnormality in the visualized posterior fossa. Disc levels: C1-C2: Severe craniocervical degenerative change, greatest on the left with bulky posterior element hypertrophy contacting and indenting the left eccentric cord with overall moderate canal stenosis. C2-C3: Posterior disc osteophyte complex and bilateral facet and uncovertebral  hypertrophy. Moderate left and mild right foraminal stenosis. Mild to moderate canal stenosis. C3-C4: Approximately 3 mm of anterolisthesis of C3 on C4. Posterior disc osteophyte complex and bilateral facet and uncovertebral hypertrophy. Resultant severe left and moderate right foraminal stenosis with moderate canal stenosis. C4-C5: Proximally 3 mm of anterolisthesis of C4 on C5 with posterior disc osteophyte complex seen bilateral facet and uncovertebral hypertrophy. Resulting severe bilateral foraminal stenosis with moderate canal stenosis. C5-C6: Posterior disc osteophyte complex with bilateral facet and uncovertebral hypertrophy. Resulting moderate bilateral foraminal stenosis with mild canal stenosis. C6-C7: Posterior endplate spurring with bilateral facet and uncovertebral hypertrophy. Resulting mild to moderate bilateral foraminal stenosis and canal stenosis. C7-T1: Posterior  disc osteophyte complex with bulky ligamentum flavum thickening and severe bilateral facet and uncovertebral hypertrophy. Resulting in severe canal and bilateral foraminal stenosis with deformity of the cord. IMPRESSION: 1. At C7-T1, severe canal stenosis with deformity of the cord. Possible slight T2 hyperintensity of the cord at this level could represent edema and/or myelomalacia. Moderate canal stenosis at C1-C2, C3-C4, C4-C5. 2. Severe foraminal stenosis on the left at C3-C4 and bilaterally at C4-C5 and C7-T1. Moderate foraminal stenosis on the left at C2-C3, right at C3-C4, and bilaterally at C5-C6 and C6-C7. 3. Diminished right vertebral artery flow void, which could be related to proximal stenosis or occlusion. Consider CTA neck to further evaluate. Electronically Signed   By: Feliberto Harts M.D.   On: 11/25/2020 20:53   MR THORACIC SPINE WO CONTRAST  Result Date: 11/25/2020 CLINICAL DATA:  Mid-back pain, neuro deficit EXAM: MRI THORACIC SPINE WITHOUT CONTRAST TECHNIQUE: Multiplanar, multisequence MR imaging of the thoracic spine was performed. No intravenous contrast was administered. COMPARISON:  None. FINDINGS: Alignment:  Normal. Vertebrae: Vertebral body heights are maintained. No focal marrow edema to suggest acute fracture or discitis/osteomyelitis. No suspicious bone lesions. Cord: No clear cord signal abnormality; however, the cervical cord is poorly evaluated at C7-T1 in the region of stenosis. Paraspinal and other soft tissues: Likely trace bilateral pleural effusions. Disc levels: At C7-T1 there is posterior disc osteophyte complex with ligamentum flavum thickening and likely severe canal stenosis, which is partially imaged. Small disc bulges at T2-T3, T5-T6 and T7-T8 with ligamentum flavum thickening and mild canal stenosis. Multilevel facet hypertrophy with moderate bilateral foraminal stenosis at T10-T11 and T11-T12, greatest on the left at T10-T11. Mild to moderate right foraminal  stenosis at T9-T10. Otherwise, multilevel mild foraminal stenosis in the thoracic spine. IMPRESSION: 1. At C7-T1, likely severe canal stenosis that is partially imaged. Probable foraminal stenosis as well, poorly evaluated. Recommend MRI of the cervical spine to further evaluate. 2. Moderate bilateral foraminal stenosis at T10-T11 and T11-T12, greatest on the left at T10-T11. Mild to moderate right foraminal stenosis at T9-T10 and multilevel mild foraminal stenosis. 3. Mild multilevel canal stenosis in the thoracic spine. Electronically Signed   By: Feliberto Harts M.D.   On: 11/25/2020 12:20   MR LUMBAR SPINE WO CONTRAST  Result Date: 11/25/2020 CLINICAL DATA:  Low back pain, cauda equina syndrome suspected EXAM: MRI LUMBAR SPINE WITHOUT CONTRAST TECHNIQUE: Multiplanar, multisequence MR imaging of the lumbar spine was performed. No intravenous contrast was administered. COMPARISON:  None. FINDINGS: Segmentation:  Standard. Alignment: 4 mm grade 1 anterolisthesis L4 on L5. Slight retrolisthesis of L5 on S1. Vertebrae: No fracture, evidence of discitis, or bone lesion. The L2 and L3 vertebral bodies are partially fused anteriorly. Diffuse intrinsic canal narrowing on the basis of congenitally short pedicles. Conus medullaris and cauda equina: Conus  extends to the L1-2 level. Conus and cauda equina appear normal. Paraspinal and other soft tissues: Negative. Disc levels: T12-L1: No disc protrusion. Unremarkable facets. No foraminal or canal stenosis. L1-L2: Mild diffuse disc bulge. Mild bilateral facet arthropathy and ligamentum flavum buckling. Findings contribute to moderate canal stenosis with moderate to severe bilateral foraminal stenosis, left worse than right. L2-L3: Mild endplate ridging. Bilateral facet arthropathy and mild ligamentum flavum buckling. Findings result in mild-to-moderate canal stenosis with mild bilateral foraminal stenosis. L3-L4: Diffuse disc osteophyte complex with right paracentral  disc protrusion. Moderate bilateral facet arthropathy and ligamentum flavum buckling. Findings result in severe canal stenosis with moderate bilateral foraminal stenosis, right worse than left. L4-L5: Diffuse disc bulge and endplate ridging. Right worse than left facet arthropathy. Moderate to severe canal stenosis with mild-to-moderate bilateral foraminal stenosis. L5-S1: Diffuse disc bulge and endplate ridging with moderate bilateral facet arthropathy. Findings result in moderate to severe canal stenosis with severe bilateral foraminal stenosis. IMPRESSION: 1. Advanced multilevel degenerative changes of the lumbar spine superimposed on a congenitally narrow canal. Findings are most pronounced at the L3-4 level where there is severe canal stenosis and moderate bilateral foraminal stenosis. 2. Moderate-to-severe canal stenosis at L4-5 and L5-S1. 3. Severe bilateral foraminal stenosis at L5-S1. Electronically Signed   By: Duanne Guess D.O.   On: 11/25/2020 12:24        Scheduled Meds:  acetaminophen  1,000 mg Oral Q8H   aspirin EC  81 mg Oral Daily   atorvastatin  40 mg Oral Daily   brimonidine  1 drop Right Eye BID   Chlorhexidine Gluconate Cloth  6 each Topical Daily   dorzolamide  1 drop Right Eye BID   heparin  5,000 Units Subcutaneous Q8H   insulin aspart  0-15 Units Subcutaneous TID WC   latanoprost  1 drop Right Eye QHS   lisinopril  10 mg Oral Daily   loratadine  10 mg Oral Daily   metFORMIN  500 mg Oral BID WC   metoprolol tartrate  25 mg Oral BID WC   pantoprazole  40 mg Oral Daily   senna  1 tablet Oral BID   sodium chloride flush  3 mL Intravenous Q12H   tamsulosin  0.4 mg Oral BID   Continuous Infusions:  sodium chloride       LOS: 1 day       Huey Bienenstock, MD [facility]   To contact the attending provider between 7A-7P or the covering provider during after hours 7P-7A, please log into the web site [email or web address] and access using universal   password for that web site. If you do not have the password, please call the hospital operator.  11/26/2020, 1:53 PM

## 2020-11-26 NOTE — Progress Notes (Signed)
PT Cancellation Note  Patient Details Name: Michael Jensen MRN: [id number] DOB: 09-Dec-1935   Cancelled Treatment:    Reason Eval/Treat Not Completed: Active bedrest order   Ilda Foil 11/26/2020, 7:43 AM  Aida Raider, PT  Office # [phone] Pager [phone]

## 2020-11-27 ENCOUNTER — Inpatient Hospital Stay (HOSPITAL_COMMUNITY): Payer: Medicare HMO

## 2020-11-27 DIAGNOSIS — R609 Edema, unspecified: Secondary | ICD-10-CM

## 2020-11-27 LAB — CBC
HCT: 38.3 % — ABNORMAL LOW (ref 39.0–52.0)
Hemoglobin: 12.5 g/dL — ABNORMAL LOW (ref 13.0–17.0)
MCH: 27.3 pg (ref 26.0–34.0)
MCHC: 32.6 g/dL (ref 30.0–36.0)
MCV: 83.6 fL (ref 80.0–100.0)
Platelets: 125 10*3/uL — ABNORMAL LOW (ref 150–400)
RBC: 4.58 MIL/uL (ref 4.22–5.81)
RDW: 14.7 % (ref 11.5–15.5)
WBC: 6.6 10*3/uL (ref 4.0–10.5)
nRBC: 0 % (ref 0.0–0.2)

## 2020-11-27 LAB — GLUCOSE, CAPILLARY
Glucose-Capillary: 144 mg/dL — ABNORMAL HIGH (ref 70–99)
Glucose-Capillary: 185 mg/dL — ABNORMAL HIGH (ref 70–99)
Glucose-Capillary: 152 mg/dL — ABNORMAL HIGH (ref 70–99)
Glucose-Capillary: 154 mg/dL — ABNORMAL HIGH (ref 70–99)

## 2020-11-27 LAB — BASIC METABOLIC PANEL
Anion gap: 8 (ref 5–15)
BUN: 17 mg/dL (ref 8–23)
CO2: 23 mmol/L (ref 22–32)
Calcium: 9.1 mg/dL (ref 8.9–10.3)
Chloride: 107 mmol/L (ref 98–111)
Creatinine, Ser: 0.72 mg/dL (ref 0.61–1.24)
GFR, Estimated: >60 mL/min (ref >60)
Glucose, Bld: 158 mg/dL — ABNORMAL HIGH (ref 70–99)
Potassium: 4.1 mmol/L (ref 3.5–5.1)
Sodium: 138 mmol/L (ref 135–145)

## 2020-11-27 NOTE — Progress Notes (Signed)
Physical Therapy Treatment Patient Details Name: Michael Jensen MRN: [id number] DOB: Aug 05, 1935 Today's Date: 11/27/2020    History of Present Illness 85 y.o. male with medical history significant of hypertension, hyperlipidemia, BPH and diabetes who is presenting today with 3 days of inability to have a bowel movement and complaints of urinary retention.  Patient reports declining mobility and ambulatory status over the last 3 months.  Neurosurgery has been consulted.  See chart for CT results as work up and scans continue.    PT Comments    Pt tolerates treatment well, continues to require significant assistance to transfer due to LE weakness. Pt with spasticity noted, more significantly in RLE, often leading to LE adduction. Pt demonstrate good UE strength to reposition in bed. Pt will benefit from continued acute PT services to improve mobility quality and reduce caregiver burden.   Follow Up Recommendations  SNF     Equipment Recommendations   (stair lift or putting bed on 1st level for safety)    Recommendations for Other Services       Precautions / Restrictions Precautions Precautions: Fall Restrictions Weight Bearing Restrictions: No    Mobility  Bed Mobility Overal bed mobility: Needs Assistance Bed Mobility: Sit to Supine     Supine to sit: Mod assist;HOB elevated Sit to supine: Mod assist;HOB elevated   General bed mobility comments: PT assist to elevate trunks onto bed. Pt able to pull on headboard to slide up in bed    Transfers Overall transfer level: Needs assistance Equipment used: 1 person hand held assist Transfers: Sit to/from Stand;Stand Pivot Transfers Sit to Stand: Max assist Stand pivot transfers: Max assist       General transfer comment: PT provides unilateral knee block and BUE support, pt requiring significant physical assistance to power up into standing, little control of balance in standing  Ambulation/Gait                  Stairs             Wheelchair Mobility    Modified Rankin (Stroke Patients Only)       Balance Overall balance assessment: Needs assistance Sitting-balance support: Single extremity supported;Feet supported Sitting balance-Leahy Scale: Poor Sitting balance - Comments: reliant on UE support of bed Postural control: Posterior lean Standing balance support: Bilateral upper extremity supported Standing balance-Leahy Scale: Zero Standing balance comment: totalA to maintain standing                            Cognition Arousal/Alertness: Awake/alert Behavior During Therapy: WFL for tasks assessed/performed Overall Cognitive Status: No family/caregiver present to determine baseline cognitive functioning Area of Impairment: Memory                               General Comments: memory may be impaired as pt provides very different accounts of history to PT and OT based on OT note      Exercises      General Comments General comments (skin integrity, edema, etc.): VSS on RA      Pertinent Vitals/Pain Pain Assessment: No/denies pain    Home Living Family/patient expects to be discharged to:: Private residence Living Arrangements: Spouse/significant other;Children;Other relatives Available Help at Discharge: Family;Available 24 hours/day;Available PRN/intermittently (spouse 24/7 supervision, other family can physically assist PRN) Type of Home: House Home Access: Level entry   Home Layout: Multi-level;Bed/bath upstairs Home Equipment:  Walker - 2 wheels;Cane - single point;Wheelchair - manual;Hospital bed      Prior Function Level of Independence: Needs assistance  Gait / Transfers Assistance Needed: pt reports he has been unable to stand for the past year, laterally transferring from hospital bed to wheelchair. Pt reports being able to independently mobilize in wheelchair upstairs in home. Pt reports not leaving upstairs over past year. Pt  reports being able to ambulate prior to progressive LE weakness one year ago       PT Goals (current goals can now be found in the care plan section) Acute Rehab PT Goals Patient Stated Goal: to improve mobility quality and regain some independence PT Goal Formulation: With patient Time For Goal Achievement: 12/11/20 Potential to Achieve Goals: Fair Progress towards PT goals: Progressing toward goals    Frequency    Min 3X/week      PT Plan Current plan remains appropriate    Co-evaluation              AM-PAC PT "6 Clicks" Mobility   Outcome Measure  Help needed turning from your back to your side while in a flat bed without using bedrails?: A Little Help needed moving from lying on your back to sitting on the side of a flat bed without using bedrails?: A Lot Help needed moving to and from a bed to a chair (including a wheelchair)?: A Lot Help needed standing up from a chair using your arms (e.g., wheelchair or bedside chair)?: A Lot Help needed to walk in hospital room?: Total Help needed climbing 3-5 steps with a railing? : Total 6 Click Score: 11    End of Session   Activity Tolerance: Patient tolerated treatment well Patient left: in bed;with call bell/phone within reach;with bed alarm set Nurse Communication: Mobility status;Need for lift equipment PT Visit Diagnosis: Other abnormalities of gait and mobility (R26.89);Muscle weakness (generalized) (M62.81);Other symptoms and signs involving the nervous system (R29.898)     Time: 1660-6301 PT Time Calculation (min) (ACUTE ONLY): 13 min  Charges:  $Therapeutic Activity: 8-22 mins                     Arlyss Gandy, PT, DPT Acute Rehabilitation Pager: [phone]    Arlyss Gandy 11/27/2020, 3:50 PM

## 2020-11-27 NOTE — Progress Notes (Signed)
Bilateral lower extremity venous duplex has been completed. Preliminary results can be found in CV Proc through chart review.   11/27/20 10:54 AM Olen Cordial RVT

## 2020-11-27 NOTE — Evaluation (Signed)
Physical Therapy Evaluation Patient Details Name: Michael Jensen MRN: [id number] DOB: [birth year] Today's Date: 11/27/2020   History of Present Illness  85 y.o. male with medical history significant of hypertension, hyperlipidemia, BPH and diabetes who is presenting today with 3 days of inability to have a bowel movement and complaints of urinary retention.  Patient reports declining mobility and ambulatory status over the last 3 months.  Neurosurgery has been consulted.  See chart for CT results as work up and scans continue.  Clinical Impression  Pt presents to PT with deficits in functional mobility, gait, balance, strength, power, tone. Pt reports being unable to stand or ambulate for ~1 year since onset of progressive weakness. Pt has not left upstairs of his home and has been utilizing a wheelchair for mobility upstairs. Pt currently demonstrates significant LE weakness which places him at a high risk for falls. Pt will benefit from aggressive mobilization and continued acute PT services to reduce falls risk and caregiver burden.    Follow Up Recommendations SNF (will reassess after neurosurgery)    Equipment Recommendations  Other (comment) (stair lift for current home recommended or rearranging home to put bed on 1st floor)    Recommendations for Other Services       Precautions / Restrictions Precautions Precautions: Fall Restrictions Weight Bearing Restrictions: No      Mobility  Bed Mobility Overal bed mobility: Needs Assistance Bed Mobility: Supine to Sit     Supine to sit: Mod assist;HOB elevated     General bed mobility comments: pt benefits from verbal cues to sequence LE mobility as well as hand hold to pull trunk into sitting    Transfers Overall transfer level: Needs assistance Equipment used: 1 person hand held assist Transfers: Sit to/from Stand;Stand Pivot Transfers Sit to Stand: Max assist;From elevated surface Stand pivot transfers: Max assist;From  elevated surface       General transfer comment: PT provides unilateral knee block and BUE support, pt requiring significant physical assistance to power up into standing, little control of balance in standing  Ambulation/Gait                Stairs            Wheelchair Mobility    Modified Rankin (Stroke Patients Only)       Balance Overall balance assessment: Needs assistance Sitting-balance support: Single extremity supported;Bilateral upper extremity supported;Feet supported Sitting balance-Leahy Scale: Poor Sitting balance - Comments: reliant on UE support of bed   Standing balance support: Bilateral upper extremity supported Standing balance-Leahy Scale: Zero Standing balance comment: totalA to maintain standing                             Pertinent Vitals/Pain Pain Assessment: No/denies pain    Home Living Family/patient expects to be discharged to:: Private residence Living Arrangements: Spouse/significant other;Children;Other relatives Available Help at Discharge: Family;Available 24 hours/day;Available PRN/intermittently (spouse 24/7 supervision, other family can physically assist PRN) Type of Home: House Home Access: Level entry     Home Layout: Multi-level;Bed/bath upstairs Home Equipment: Walker - 2 wheels;Cane - single point;Wheelchair - manual;Hospital bed      Prior Function Level of Independence: Needs assistance   Gait / Transfers Assistance Needed: pt reports he has been unable to stand for the past year, laterally transferring from hospital bed to wheelchair. Pt reports being able to independently mobilize in wheelchair upstairs in home. Pt reports not leaving upstairs over past  year. Pt reports being able to ambulate prior to progressive LE weakness one year ago           Hand Dominance   Dominant Hand: Right    Extremity/Trunk Assessment   Upper Extremity Assessment Upper Extremity Assessment: Generalized  weakness    Lower Extremity Assessment Lower Extremity Assessment: RLE deficits/detail;LLE deficits/detail RLE Deficits / Details: ankle ROM WFL, 3/5 PF/DF strength, knee extension ROM WFL, flexion limited to 100 degrees AROM, knee flexion 3+/5, extension 3+/5 LLE Deficits / Details: ankle ROM WFL, 3/5 PF/DF strength, knee extension ROM WFL, flexion limited to 100 degrees AROM, knee flexion 3+/5, extension 3+/5    Cervical / Trunk Assessment Cervical / Trunk Assessment: Kyphotic  Communication   Communication: No difficulties  Cognition Arousal/Alertness: Awake/alert Behavior During Therapy: WFL for tasks assessed/performed Overall Cognitive Status: Impaired/Different from baseline Area of Impairment: Memory                               General Comments: memory may be impaired as pt provides very different accounts of history to PT and OT based on OT note      General Comments General comments (skin integrity, edema, etc.): VSS on RA. 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    Exercises     Assessment/Plan    PT Assessment Patient needs continued PT services  PT Problem List Decreased strength;Decreased activity tolerance;Decreased balance;Decreased mobility;Decreased safety awareness;Decreased cognition       PT Treatment Interventions DME instruction;Gait training;Functional mobility training;Therapeutic activities;Therapeutic exercise;Balance training;Neuromuscular re-education;Cognitive remediation;Patient/family education;Wheelchair mobility training    PT Goals (Current goals can be found in the Care Plan section)  Acute Rehab PT Goals Patient Stated Goal: to improve mobility quality and regain some independence PT Goal Formulation: With patient Time For Goal Achievement: 12/11/20 Potential to Achieve Goals: Fair    Frequency Min 3X/week   Barriers to discharge        Co-evaluation                AM-PAC PT "6 Clicks" Mobility  Outcome Measure Help needed turning from your back to your side while in a flat bed without using bedrails?: A Little Help needed moving from lying on your back to sitting on the side of a flat bed without using bedrails?: A Lot Help needed moving to and from a bed to a chair (including a wheelchair)?: A Lot Help needed standing up from a chair using your arms (e.g., wheelchair or bedside chair)?: A Lot Help needed to walk in hospital room?: Total Help needed climbing 3-5 steps with a railing? : Total 6 Click Score: 11    End of Session   Activity Tolerance: Patient tolerated treatment well Patient left: in chair;with call bell/phone within reach;with chair alarm set Nurse Communication: Mobility status;Need for lift equipment PT Visit Diagnosis: Other abnormalities of gait and mobility (R26.89);Muscle weakness (generalized) (M62.81);Other symptoms and signs involving the nervous system (R29.898)    Time: 1132-1202 PT Time Calculation (min) (ACUTE ONLY): 30 min   Charges:   PT Evaluation $PT Eval Moderate Complexity: 1 Mod          Arlyss Gandy, PT, DPT Acute Rehabilitation Pager: [phone]   Arlyss Gandy 11/27/2020, 12:56 PM

## 2020-11-27 NOTE — Progress Notes (Signed)
PROGRESS NOTE    Michael Jensen  LTJ:[id number] DOB: [birth year] DOA: 11/25/2020 PCP: Soundra Pilon, FNP     Chief Complaint  Patient presents with   urine retention   Flank Pain   Constipation    Brief Narrative:   HPI: Michael Jensen is a 85 y.o. male with medical history significant of hypertension, hyperlipidemia, BPH and diabetes who is presenting today with 3 days of inability to have a bowel movement and complaints of urinary retention.  As well patient with progressive weakness over the last few month, his work-up is significant for UTI/urinary retention, and significant spinal stenosis.    Assessment & Plan:   Active Problems:   Benign prostatic hyperplasia without lower urinary tract symptoms   Diabetic peripheral neuropathy associated with type 2 diabetes mellitus (HCC)   Essential hypertension   Gastroesophageal reflux disease without esophagitis   Acute lower UTI   Acute urinary retention   Spinal stenosis of lumbar region at multiple levels   Spinal stenosis, cervicothoracic region   Spinal stenosis/bilateral weakness right > left -Neurosurgery input greatly appreciated, MRI cervical/thoracic/lumbar spine notable for significant degenerative disease with stenosis at multiple level, but worst at C7-T1, where severe stenosis causes some cord signal abnormalities. -Plan for surgical correction in the form of ACDF, as current symptoms may be related to C7/T1 cord spinal stenosis -Plan for OR tomorrow  Urinary retention -May be multifactorial in the setting of BPH, UTI, as well possibly his spinal stenosis contributing to this finding as discussed with neurosurgery. -Continue with tamsulosin - continue with foley, will have voiding trials after surgery.   Acute UTI  -Continue with Rocephin   Diabetes - last A1C 2019. Nl renal function Plan     Continue metformin             A1C             SS coverage   HTN- continue home meds  DVT prophylaxis:  Heparin Code Status: Full Family Communication: none at bedside Disposition:   Status is: Inpatient  Remains inpatient appropriate because:IV treatments appropriate due to intensity of illness or inability to take PO  Dispo: The patient is from: Home              Anticipated d/c is to: Home              Patient currently is not medically stable to d/c.   Difficult to place patient No       Consultants:  Neurosurgery  Subjective:  No significant events overnight as discussed with staff, patient himself denies any complaints.  Objective: Vitals:   11/27/20 0402 11/27/20 0815 11/27/20 1100 11/27/20 1141  BP: (P) 126/84 118/70  123/83  Pulse: (!) (P) 56 65  68  Resp: (P) 16 16  16   Temp: (!) (P) 97.5 F (36.4 C) 98.4 F (36.9 C)  97.7 F (36.5 C)  TempSrc: (P) Oral Oral Oral Oral  SpO2: (P) 100% 99%  100%  Weight:      Height:        Intake/Output Summary (Last 24 hours) at 11/27/2020 1354 Last data filed at 11/27/2020 01/27/2021 Gross per 24 hour  Intake --  Output 1300 ml  Net -1300 ml   Filed Weights   11/25/20 0823  Weight: 89.8 kg    Examination:  Awake Alert, Oriented X 3, No new F.N deficits, Normal affect, legally blind Symmetrical Chest wall movement, Good air movement bilaterally, CTAB RRR,No Gallops,Rubs or new  Murmurs, No Parasternal Heave +ve B.Sounds, Abd Soft, No tenderness, No rebound - guarding or rigidity. No Cyanosis, right lower extremity edema.   Data Reviewed: I have personally reviewed following labs and imaging studies  CBC: Recent Labs  Lab 11/25/20 0856 11/27/20 0225  WBC 7.0 6.6  NEUTROABS 5.6  --   HGB 13.7 12.5*  HCT 41.7 38.3*  MCV 82.9 83.6  PLT 130* 125*    Basic Metabolic Panel: Recent Labs  Lab 11/25/20 0856 11/26/20 0051 11/27/20 0225  NA 137 139 138  K 3.9 3.9 4.1  CL 104 106 107  CO2 GLUCOSE 166* 139* 158*  BUN CREATININE 0.90 0.85 0.72  CALCIUM 9.2 9.0 9.1    GFR: Estimated  Creatinine Clearance: 77.5 mL/min (by C-G formula based on SCr of 0.72 mg/dL).  Liver Function Tests: No results for input(s): AST, ALT, ALKPHOS, BILITOT, PROT, ALBUMIN in the last 168 hours.  CBG: Recent Labs  Lab 11/26/20 1209 11/26/20 1712 11/26/20 2118 11/27/20 0814 11/27/20 1137  GLUCAP 159* 242* 186* 144* 185*     Recent Results (from the past 240 hour(s))  Urine Culture     Status: Abnormal (Preliminary result)   Collection Time: 11/25/20  8:56 AM   Specimen: Urine, Catheterized  Result Value Ref Range Status   Specimen Description   Final    URINE, CATHETERIZED Performed at [facility], [address], [place], [place] [address]    Special Requests   Final    NONE Performed at [facility], [address] [place], [place] [address]    Culture (A)  Final    >=100,000 COLONIES/mL ENTEROBACTER AEROGENES CULTURE REINCUBATED FOR BETTER GROWTH Performed at [facility], [address], [place], [place] [address]    Report Status PENDING  Incomplete   Organism ID, Bacteria ENTEROBACTER AEROGENES (A)  Final      Susceptibility   Enterobacter aerogenes - MIC*    CEFAZOLIN RESISTANT Resistant     CEFEPIME <=0.12 SENSITIVE Sensitive     CEFTRIAXONE <=0.25 SENSITIVE Sensitive     CIPROFLOXACIN <=0.25 SENSITIVE Sensitive     GENTAMICIN <=1 SENSITIVE Sensitive     IMIPENEM <=0.25 SENSITIVE Sensitive     NITROFURANTOIN <=16 SENSITIVE Sensitive     TRIMETH/SULFA <=20 SENSITIVE Sensitive     PIP/TAZO <=4 SENSITIVE Sensitive     * >=100,000 COLONIES/mL ENTEROBACTER AEROGENES  SARS CORONAVIRUS 2 (TAT 6-24 HRS) Nasopharyngeal Nasopharyngeal Swab     Status: None   Collection Time: 11/25/20  4:53 PM   Specimen: Nasopharyngeal Swab  Result Value Ref Range Status   SARS Coronavirus 2 NEGATIVE NEGATIVE Final    Comment: (NOTE) SARS-CoV-2 target nucleic acids are NOT DETECTED.  The SARS-CoV-2 RNA is generally detectable in upper  and lower respiratory specimens during the acute phase of infection. Negative results do not preclude SARS-CoV-2 infection, do not rule out co-infections with other pathogens, and should not be used as the sole basis for treatment or other patient management decisions. Negative results must be combined with clinical observations, patient history, and epidemiological information. The expected result is Negative.  Fact Sheet for Patients: HairSlick.no  Fact Sheet for Healthcare Providers: quierodirigir.com  This test is not yet approved or cleared by the [place] FDA and  has been authorized for detection and/or diagnosis of SARS-CoV-2 by FDA under an Emergency Use Authorization (EUA). This EUA will remain  in effect (meaning this test  can be used) for the duration of the COVID-19 declaration under Se ction 564(b)(1) of the Act, 21 U.S.C. section 360bbb-3(b)(1), unless the authorization is terminated or revoked sooner.  Performed at [facility], [address] [place], [place] [address]          Radiology Studies: CT CERVICAL SPINE WO CONTRAST  Result Date: 11/26/2020 CLINICAL DATA:  85 year old male with lower extremity weakness. C7-T1 spinal stenosis with cord mass effect and possible myelomalacia. Preoperative exam. EXAM: CT CERVICAL SPINE WITHOUT CONTRAST TECHNIQUE: Multidetector CT imaging of the cervical spine was performed without intravenous contrast. Multiplanar CT image reconstructions were also generated. COMPARISON:  Total spine MRI yesterday. FINDINGS: Alignment: Moderate levoconvex cervical scoliosis. Straightening of cervical lordosis. Subtle anterolisthesis of C7 on T1. Bilateral posterior element alignment is within normal limits. Skull base and vertebrae: Bone mineralization is within normal limits for age. Visualized skull base is intact. No atlanto-occipital dissociation. No acute osseous  abnormality identified. Soft tissues and spinal canal: Partially retropharyngeal course of both carotids in the lower neck. Negative visible noncontrast neck soft tissues. Disc levels: C1-C2: Severe left side joint space loss with vacuum phenomena and bone-on-bone appearance (coronal image 24). C2-C3:  Solid interbody and posterior element ankylosis. C3-C4: Subtle anterolisthesis but solid interbody and posterior element ankylosis. C4-C5: Vacuum disc and vacuum facet on the right. Severe right side facet hypertrophy. Bulky disc osteophyte complex. Possible developing facet ankylosis at this level more so on the left (sagittal image 42) but no definite bridging bone at this time. C5-C6:  Interbody and posterior element ankylosis. C6-C7:  Interbody and posterior element ankylosis. C7-T1: Mild anterolisthesis. Disc space loss and vacuum disc. Moderate facet hypertrophy greater on the right. T1-T2: Mild circumferential disc bulge. Mild facet hypertrophy. No ankylosis. Upper chest: Apical emphysema visible on the right. Other: Negative visible noncontrast posterior fossa. IMPRESSION: 1. 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 2. Subsequent severe degeneration at C7-T1 (mild anterolisthesis), and the other unfused levels C1-C2 (on the left), and C4-C5 (severe facet arthropathy with questionable developing facet fusion). 3. No visible upper thoracic ankylosis. Electronically Signed   By: Odessa FlemingH  Hall M.D.   On: 11/26/2020 11:10   MR Cervical Spine Wo Contrast  Result Date: 11/25/2020 CLINICAL DATA:  Motor neuron disease abnormal c7 on thoracic image and lower ext weakness EXAM: MRI CERVICAL SPINE WITHOUT CONTRAST TECHNIQUE: Multiplanar, multisequence MR imaging of the cervical spine was performed. No intravenous contrast was administered. COMPARISON:  None. FINDINGS: Alignment: Reversal of the normal cervical lordosis. Approximately 3 mm of anterolisthesis of C3 on C4 and C4 on  C5. Vertebrae: Multilevel degenerative/discogenic endplate signal changes. No specific evidence of acute fracture or discitis/osteomyelitis. Cord: Motion limited evaluation with possible slight T2 hyperintensity of the cord at C7-T1. Posterior Fossa, vertebral arteries, paraspinal tissues: Diminished right vertebral artery flow void. Left vertebral artery flow void is maintained. No evidence of acute abnormality in the visualized posterior fossa. Disc levels: C1-C2: Severe craniocervical degenerative change, greatest on the left with bulky posterior element hypertrophy contacting and indenting the left eccentric cord with overall moderate canal stenosis. C2-C3: Posterior disc osteophyte complex and bilateral facet and uncovertebral hypertrophy. Moderate left and mild right foraminal stenosis. Mild to moderate canal stenosis. C3-C4: Approximately 3 mm of anterolisthesis of C3 on C4. Posterior disc osteophyte complex and bilateral facet and uncovertebral hypertrophy. Resultant severe left and moderate right foraminal stenosis with moderate canal stenosis. C4-C5: Proximally 3 mm of anterolisthesis  of C4 on C5 with posterior disc osteophyte complex seen bilateral facet and uncovertebral hypertrophy. Resulting severe bilateral foraminal stenosis with moderate canal stenosis. C5-C6: Posterior disc osteophyte complex with bilateral facet and uncovertebral hypertrophy. Resulting moderate bilateral foraminal stenosis with mild canal stenosis. C6-C7: Posterior endplate spurring with bilateral facet and uncovertebral hypertrophy. Resulting mild to moderate bilateral foraminal stenosis and canal stenosis. C7-T1: Posterior disc osteophyte complex with bulky ligamentum flavum thickening and severe bilateral facet and uncovertebral hypertrophy. Resulting in severe canal and bilateral foraminal stenosis with deformity of the cord. IMPRESSION: 1. At C7-T1, severe canal stenosis with deformity of the cord. Possible slight T2  hyperintensity of the cord at this level could represent edema and/or myelomalacia. Moderate canal stenosis at C1-C2, C3-C4, C4-C5. 2. Severe foraminal stenosis on the left at C3-C4 and bilaterally at C4-C5 and C7-T1. Moderate foraminal stenosis on the left at C2-C3, right at C3-C4, and bilaterally at C5-C6 and C6-C7. 3. Diminished right vertebral artery flow void, which could be related to proximal stenosis or occlusion. Consider CTA neck to further evaluate. Electronically Signed   By: Feliberto Harts M.D.   On: 11/25/2020 20:53   VAS [place] LOWER EXTREMITY VENOUS (DVT)  Result Date: 11/27/2020  Lower Venous DVT Study Patient Name:  CONSUELO THAYNE  Date of Exam:   11/27/2020 Medical Rec #: [id number]      Accession #:    [id number] Date of Birth: May 04, 1935      Patient Gender: M Patient Age:   66 years Exam [facility]  [facility] Procedure:      VAS [place] LOWER EXTREMITY VENOUS (DVT) Referring Phys: Kateleen Encarnacion --------------------------------------------------------------------------------  Indications: Edema.  Risk Factors: None identified. Limitations: Poor ultrasound/tissue interface. Comparison Study: No prior studies. Performing Technologist: Chanda Busing RVT  Examination Guidelines: A complete evaluation includes B-mode imaging, spectral Doppler, color Doppler, and power Doppler as needed of all accessible portions of each vessel. Bilateral testing is considered an integral part of a complete examination. Limited examinations for reoccurring indications may be performed as noted. The reflux portion of the exam is performed with the patient in reverse Trendelenburg.  +---------+---------------+---------+-----------+----------+--------------+ RIGHT    CompressibilityPhasicitySpontaneityPropertiesThrombus Aging +---------+---------------+---------+-----------+----------+--------------+ CFV      Full           Yes      Yes                                  +---------+---------------+---------+-----------+----------+--------------+ SFJ      Full                                                        +---------+---------------+---------+-----------+----------+--------------+ FV Prox  Full                                                        +---------+---------------+---------+-----------+----------+--------------+ FV Mid   Full                                                        +---------+---------------+---------+-----------+----------+--------------+  FV DistalFull                                                        +---------+---------------+---------+-----------+----------+--------------+ PFV      Full                                                        +---------+---------------+---------+-----------+----------+--------------+ POP      Full           Yes      Yes                                 +---------+---------------+---------+-----------+----------+--------------+ PTV      Full                                                        +---------+---------------+---------+-----------+----------+--------------+ PERO     Full                                                        +---------+---------------+---------+-----------+----------+--------------+   +---------+---------------+---------+-----------+----------+-------------------+ LEFT     CompressibilityPhasicitySpontaneityPropertiesThrombus Aging      +---------+---------------+---------+-----------+----------+-------------------+ CFV      Full           Yes      Yes                                      +---------+---------------+---------+-----------+----------+-------------------+ SFJ      Full                                                             +---------+---------------+---------+-----------+----------+-------------------+ FV Prox  Full                                                              +---------+---------------+---------+-----------+----------+-------------------+ FV Mid   Full                                                             +---------+---------------+---------+-----------+----------+-------------------+ FV Distal               Yes  Yes                                      +---------+---------------+---------+-----------+----------+-------------------+ PFV      Full                                                             +---------+---------------+---------+-----------+----------+-------------------+ POP      Full           Yes      Yes                                      +---------+---------------+---------+-----------+----------+-------------------+ PTV      Full                                                             +---------+---------------+---------+-----------+----------+-------------------+ PERO                                                  Not well visualized +---------+---------------+---------+-----------+----------+-------------------+    Summary: RIGHT: - There is no evidence of deep vein thrombosis in the lower extremity. However, portions of this examination were limited- see technologist comments above.  - No cystic structure found in the popliteal fossa.  LEFT: - There is no evidence of deep vein thrombosis in the lower extremity. However, portions of this examination were limited- see technologist comments above.  - No cystic structure found in the popliteal fossa.  *See table(s) above for measurements and observations.    Preliminary         Scheduled Meds:  acetaminophen  1,000 mg Oral Q8H   aspirin EC  81 mg Oral Daily   atorvastatin  40 mg Oral Daily   brimonidine  1 drop Right Eye BID   Chlorhexidine Gluconate Cloth  6 each Topical Daily   dorzolamide  1 drop Right Eye BID   heparin  5,000 Units Subcutaneous Q8H   insulin aspart  0-15 Units Subcutaneous TID WC   latanoprost  1 drop Right  Eye QHS   lisinopril  10 mg Oral Daily   loratadine  10 mg Oral Daily   metFORMIN  500 mg Oral BID WC   metoprolol tartrate  25 mg Oral BID WC   pantoprazole  40 mg Oral Daily   senna  1 tablet Oral BID   sodium chloride flush  3 mL Intravenous Q12H   tamsulosin  0.4 mg Oral BID   Continuous Infusions:  sodium chloride     cefTRIAXone (ROCEPHIN)  IV 1 g (11/27/20 0903)     LOS: 2 days       Huey Bienenstock, MD [facility]   To contact the attending provider between 7A-7P or the covering provider during after hours 7P-7A, please log into the web site [email or web address] and access  using universal [place] password for that web site. If you do not have the password, please call the hospital operator.  11/27/2020, 1:54 PM

## 2020-11-27 NOTE — Progress Notes (Signed)
Neurosurgery Service Progress Note  Subjective: No acute events overnight. No new complaints   Objective: Vitals:   11/26/20 2329 11/27/20 0019 11/27/20 0402 11/27/20 0815  BP: 123/70 104/71 (P) 126/84 118/70  Pulse: 73 63 (!) (P) 56 65  Resp: 17 15 (P) 16 16  Temp: 97.9 F (36.6 C) 98 F (36.7 C) (!) (P) 97.5 F (36.4 C) 98.4 F (36.9 C)  TempSrc: Oral Oral (P) Oral Oral  SpO2: 96%  (P) 100% 99%  Weight:      Height:        Physical Exam: Strength 4+ to 5/5 in BUE BLE with increased tone bilaterally, RLE is 1-2/5 diffusely, LLE is 3/5 to 4-/5 diffusely, reflexes 1+ in BUE, 3+ at the right patella, 1+ at the left patella  Assessment & Plan: 85 y.o. man w/ progressive BLE weakness, R>L. 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  -OR tomorrow afternoon, please hold SQH after tonight's dose, will make NPO p MN -given his retention and anesthesia tomorrow, can keep his foley in and wean it post-op  Jadene Pierini  11/27/20 10:16 AM

## 2020-11-28 ENCOUNTER — Inpatient Hospital Stay (HOSPITAL_COMMUNITY): Payer: Medicare HMO

## 2020-11-28 ENCOUNTER — Inpatient Hospital Stay (HOSPITAL_COMMUNITY): Payer: Medicare HMO | Admitting: Anesthesiology

## 2020-11-28 ENCOUNTER — Encounter (HOSPITAL_COMMUNITY): Payer: Self-pay | Admitting: Internal Medicine

## 2020-11-28 ENCOUNTER — Encounter (HOSPITAL_COMMUNITY)
Admission: EM | Disposition: A | Discharge status: Skilled Nursing Facility | Payer: Self-pay | Source: Home / Self Care | Attending: Internal Medicine

## 2020-11-28 HISTORY — PX: ANTERIOR CERVICAL DECOMP/DISCECTOMY FUSION: SHX1161

## 2020-11-28 LAB — GLUCOSE, CAPILLARY
Glucose-Capillary: 129 mg/dL — ABNORMAL HIGH (ref 70–99)
Glucose-Capillary: 127 mg/dL — ABNORMAL HIGH (ref 70–99)
Glucose-Capillary: 145 mg/dL — ABNORMAL HIGH (ref 70–99)
Glucose-Capillary: 143 mg/dL — ABNORMAL HIGH (ref 70–99)
Glucose-Capillary: 110 mg/dL — ABNORMAL HIGH (ref 70–99)

## 2020-11-28 LAB — URINE CULTURE: Culture: >=100000 — AB

## 2020-11-28 LAB — SURGICAL PCR SCREEN
MRSA, PCR: NEGATIVE
Staphylococcus aureus: NEGATIVE

## 2020-11-28 IMAGING — RF DG CERVICAL SPINE 2 OR 3 VIEWS
1 series · 1 of 1 positions shown · non-contrast
Comparison: [DATE]

CLINICAL DATA: ACDF

EXAM:
CERVICAL SPINE - 2-3 VIEW

[Series 1: run · 1 of 1 slices shown]
[im 1/1]
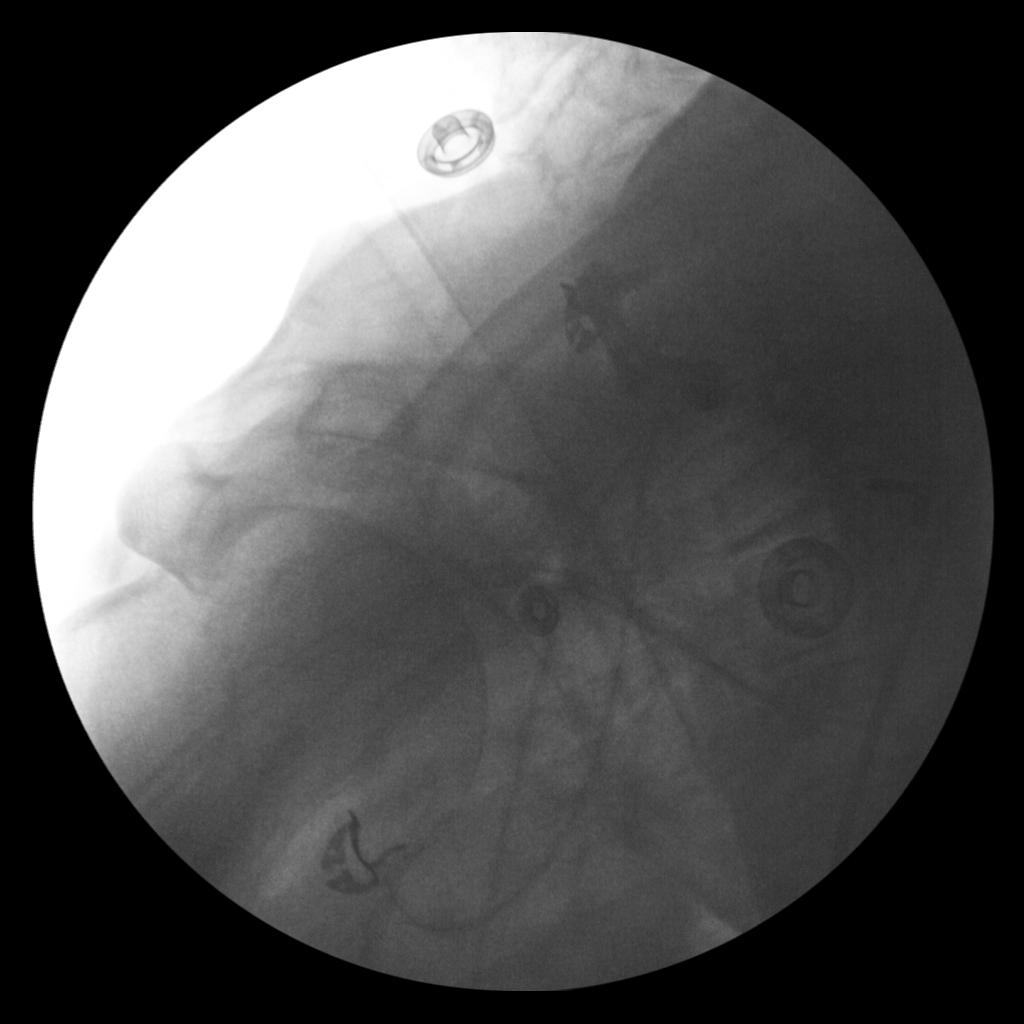

[1 of 1 positions shown; findings below may reference images not displayed]

FINDINGS: High single fluoroscopic images obtained during the performance of
the procedure and is provided for interpretation only. Evaluation is
limited due to small field of view. Anterior plate and screw
fixation are seen, but the level cannot be specified due to field of
view limitations. Please refer to operative report.

FLUOROSCOPY TIME:  22 seconds
IMPRESSION: 1. Intraoperative evaluation as above. Please refer to the operative
report.

## 2020-11-28 SURGERY — ANTERIOR CERVICAL DECOMPRESSION/DISCECTOMY FUSION 1 LEVEL
Anesthesia: General | Site: Neck

## 2020-11-28 MED ORDER — LIDOCAINE-EPINEPHRINE 1 %-1:100000 IJ SOLN
INTRAMUSCULAR | Status: AC
  Filled 2020-11-28: qty 1

## 2020-11-28 MED ORDER — LACTATED RINGERS IV SOLN: INTRAVENOUS | Status: DC | PRN

## 2020-11-28 MED ORDER — FENTANYL CITRATE (PF) 250 MCG/5ML IJ SOLN
INTRAMUSCULAR | Status: DC | PRN
  Administered 2020-11-28 (×2): 100 ug via INTRAVENOUS
  Administered 2020-11-28: 50 ug via INTRAVENOUS

## 2020-11-28 MED ORDER — CHLORHEXIDINE GLUCONATE 0.12 % MT SOLN: 15.0000 mL | Freq: Once | OROMUCOSAL | Status: AC

## 2020-11-28 MED ORDER — ACETAMINOPHEN 325 MG PO TABS
650.0000 mg | ORAL_TABLET | Freq: Four times a day (QID) | ORAL | Status: DC | PRN
  Administered 2020-11-28: 650 mg via ORAL
  Filled 2020-11-28 (×2): qty 2

## 2020-11-28 MED ORDER — ACETAMINOPHEN 325 MG PO TABS: 325.0000 mg | ORAL_TABLET | ORAL | Status: DC | PRN

## 2020-11-28 MED ORDER — ONDANSETRON HCL 4 MG/2ML IJ SOLN: 4.0000 mg | Freq: Once | INTRAMUSCULAR | Status: DC | PRN

## 2020-11-28 MED ORDER — OXYCODONE HCL 5 MG PO TABS: 5.0000 mg | ORAL_TABLET | Freq: Once | ORAL | Status: DC | PRN

## 2020-11-28 MED ORDER — ROCURONIUM BROMIDE 10 MG/ML (PF) SYRINGE
PREFILLED_SYRINGE | INTRAVENOUS | Status: AC
  Filled 2020-11-28: qty 10

## 2020-11-28 MED ORDER — 0.9 % SODIUM CHLORIDE (POUR BTL) OPTIME
TOPICAL | Status: DC | PRN
  Administered 2020-11-28: 1000 mL

## 2020-11-28 MED ORDER — FENTANYL CITRATE (PF) 250 MCG/5ML IJ SOLN
INTRAMUSCULAR | Status: AC
  Filled 2020-11-28: qty 5

## 2020-11-28 MED ORDER — LACTATED RINGERS IV SOLN: INTRAVENOUS | Status: DC

## 2020-11-28 MED ORDER — PHENYLEPHRINE HCL-NACL 20-0.9 MG/250ML-% IV SOLN
INTRAVENOUS | Status: DC | PRN
  Administered 2020-11-28: 25 ug/min via INTRAVENOUS

## 2020-11-28 MED ORDER — THROMBIN 5000 UNITS EX SOLR
CUTANEOUS | Status: AC
  Filled 2020-11-28: qty 5000

## 2020-11-28 MED ORDER — LIDOCAINE-EPINEPHRINE 1 %-1:100000 IJ SOLN
INTRAMUSCULAR | Status: DC | PRN
  Administered 2020-11-28: 6 mL via INTRADERMAL

## 2020-11-28 MED ORDER — ORAL CARE MOUTH RINSE: 15.0000 mL | Freq: Once | OROMUCOSAL | Status: AC

## 2020-11-28 MED ORDER — PROPOFOL 10 MG/ML IV BOLUS
INTRAVENOUS | Status: DC | PRN
  Administered 2020-11-28: 100 mg via INTRAVENOUS
  Administered 2020-11-28: 40 mg via INTRAVENOUS

## 2020-11-28 MED ORDER — ASPIRIN EC 81 MG PO TBEC
81.0000 mg | DELAYED_RELEASE_TABLET | Freq: Every day | ORAL | Status: DC
  Administered 2020-11-29 – 2020-12-01 (×3): 81 mg via ORAL
  Filled 2020-11-28 (×3): qty 1

## 2020-11-28 MED ORDER — THROMBIN 5000 UNITS EX SOLR
OROMUCOSAL | Status: DC | PRN
  Administered 2020-11-28: 5 mL via TOPICAL

## 2020-11-28 MED ORDER — LIDOCAINE 2% (20 MG/ML) 5 ML SYRINGE
INTRAMUSCULAR | Status: AC
  Filled 2020-11-28: qty 5

## 2020-11-28 MED ORDER — SUGAMMADEX SODIUM 200 MG/2ML IV SOLN
INTRAVENOUS | Status: DC | PRN
Start: 2020-11-28 — End: 2020-11-28
  Administered 2020-11-28: 200 mg via INTRAVENOUS

## 2020-11-28 MED ORDER — PROPOFOL 10 MG/ML IV BOLUS
INTRAVENOUS | Status: AC
  Filled 2020-11-28: qty 20

## 2020-11-28 MED ORDER — LIDOCAINE 2% (20 MG/ML) 5 ML SYRINGE
INTRAMUSCULAR | Status: DC | PRN
  Administered 2020-11-28: 40 mg via INTRAVENOUS

## 2020-11-28 MED ORDER — ROCURONIUM BROMIDE 10 MG/ML (PF) SYRINGE
PREFILLED_SYRINGE | INTRAVENOUS | Status: DC | PRN
Start: 2020-11-28 — End: 2020-11-28
  Administered 2020-11-28: 50 mg via INTRAVENOUS
  Administered 2020-11-28: 20 mg via INTRAVENOUS

## 2020-11-28 MED ORDER — DEXAMETHASONE SODIUM PHOSPHATE 10 MG/ML IJ SOLN
INTRAMUSCULAR | Status: DC | PRN
  Administered 2020-11-28: 4 mg via INTRAVENOUS

## 2020-11-28 MED ORDER — FENTANYL CITRATE (PF) 100 MCG/2ML IJ SOLN: 25.0000 ug | INTRAMUSCULAR | Status: DC | PRN

## 2020-11-28 MED ORDER — HYDROCODONE-ACETAMINOPHEN 5-325 MG PO TABS
1.0000 | ORAL_TABLET | Freq: Four times a day (QID) | ORAL | Status: DC | PRN
  Administered 2020-12-01: 2 via ORAL
  Filled 2020-11-28: qty 2

## 2020-11-28 MED ORDER — CHLORHEXIDINE GLUCONATE 0.12 % MT SOLN
OROMUCOSAL | Status: AC
  Administered 2020-11-28: 15 mL via OROMUCOSAL
  Filled 2020-11-28: qty 15

## 2020-11-28 MED ORDER — ACETAMINOPHEN 160 MG/5ML PO SOLN: 325.0000 mg | ORAL | Status: DC | PRN

## 2020-11-28 MED ORDER — MEPERIDINE HCL 25 MG/ML IJ SOLN: 6.2500 mg | INTRAMUSCULAR | Status: DC | PRN

## 2020-11-28 MED ORDER — OXYCODONE HCL 5 MG/5ML PO SOLN: 5.0000 mg | Freq: Once | ORAL | Status: DC | PRN

## 2020-11-28 MED ORDER — SODIUM CHLORIDE 0.9 % IV SOLN: INTRAVENOUS | Status: DC

## 2020-11-28 MED ORDER — MIDAZOLAM HCL 2 MG/2ML IJ SOLN: INTRAMUSCULAR | Status: DC | PRN

## 2020-11-28 MED ORDER — ONDANSETRON HCL 4 MG/2ML IJ SOLN
INTRAMUSCULAR | Status: DC | PRN
Start: 2020-11-28 — End: 2020-11-28
  Administered 2020-11-28: 4 mg via INTRAVENOUS

## 2020-11-28 SURGICAL SUPPLY — 53 items
BAG COUNTER SPONGE SURGICOUNT (BAG) ×4 IMPLANT
BAND RUBBER #18 3X1/16 STRL (MISCELLANEOUS) ×4 IMPLANT
BENZOIN TINCTURE PRP APPL 2/3 (GAUZE/BANDAGES/DRESSINGS) IMPLANT
BLADE CLIPPER SURG (BLADE) IMPLANT
BLADE SURG 11 STRL SS (BLADE) ×2 IMPLANT
BUR MATCHSTICK NEURO 3.0 LAGG (BURR) ×2 IMPLANT
CANISTER SUCT 3000ML PPV (MISCELLANEOUS) ×2 IMPLANT
DECANTER SPIKE VIAL GLASS SM (MISCELLANEOUS) ×2 IMPLANT
DERMABOND ADVANCED (GAUZE/BANDAGES/DRESSINGS) ×1
DERMABOND ADVANCED .7 DNX12 (GAUZE/BANDAGES/DRESSINGS) ×1 IMPLANT
DRAPE C-ARM 42X72 X-RAY (DRAPES) ×4 IMPLANT
DRAPE HALF SHEET 40X57 (DRAPES) IMPLANT
DRAPE LAPAROTOMY 100X72 PEDS (DRAPES) ×2 IMPLANT
DRAPE MICROSCOPE LEICA (MISCELLANEOUS) ×2 IMPLANT
DURAPREP 6ML APPLICATOR 50/CS (WOUND CARE) ×2 IMPLANT
ELECT COATED BLADE 2.86 ST (ELECTRODE) ×2 IMPLANT
ELECT REM PT RETURN 9FT ADLT (ELECTROSURGICAL) ×2
ELECTRODE REM PT RTRN 9FT ADLT (ELECTROSURGICAL) ×1 IMPLANT
GAUZE 4X4 16PLY ~~LOC~~+RFID DBL (SPONGE) IMPLANT
GLOVE EXAM NITRILE LRG STRL (GLOVE) IMPLANT
GLOVE EXAM NITRILE XL STR (GLOVE) IMPLANT
GLOVE EXAM NITRILE XS STR PU (GLOVE) IMPLANT
GLOVE SURG LTX SZ7.5 (GLOVE) ×2 IMPLANT
GLOVE SURG POLYISO LF SZ6.5 (GLOVE) ×2 IMPLANT
GLOVE SURG UNDER POLY LF SZ7 (GLOVE) ×2 IMPLANT
GLOVE SURG UNDER POLY LF SZ7.5 (GLOVE) ×4 IMPLANT
GOWN STRL REUS W/ TWL LRG LVL3 (GOWN DISPOSABLE) ×2 IMPLANT
GOWN STRL REUS W/ TWL XL LVL3 (GOWN DISPOSABLE) IMPLANT
GOWN STRL REUS W/TWL 2XL LVL3 (GOWN DISPOSABLE) IMPLANT
GOWN STRL REUS W/TWL LRG LVL3 (GOWN DISPOSABLE) ×2
GOWN STRL REUS W/TWL XL LVL3 (GOWN DISPOSABLE)
HEMOSTAT POWDER KIT SURGIFOAM (HEMOSTASIS) ×2 IMPLANT
KIT BASIN OR (CUSTOM PROCEDURE TRAY) ×2 IMPLANT
KIT TURNOVER KIT B (KITS) ×2 IMPLANT
NEEDLE HYPO 22GX1.5 SAFETY (NEEDLE) ×2 IMPLANT
NEEDLE SPNL 18GX3.5 QUINCKE PK (NEEDLE) ×2 IMPLANT
NEEDLE SPNL 22GX3.5 QUINCKE BK (NEEDLE) ×2 IMPLANT
NS IRRIG 1000ML POUR BTL (IV SOLUTION) ×2 IMPLANT
PACK LAMINECTOMY NEURO (CUSTOM PROCEDURE TRAY) ×2 IMPLANT
PAD ARMBOARD 7.5X6 YLW CONV (MISCELLANEOUS) ×6 IMPLANT
PIN DISTRACTION 14MM (PIN) ×4 IMPLANT
PLATE 23MM (Plate) ×2 IMPLANT
SCREW SELF TAP VAR 4.0X13 (Screw) ×8 IMPLANT
SPACER BONE CORNERSTONE 7X14 (Orthopedic Implant) ×2 IMPLANT
SPONGE INTESTINAL PEANUT (DISPOSABLE) ×2 IMPLANT
SPONGE SURGIFOAM ABS GEL SZ50 (HEMOSTASIS) IMPLANT
STAPLER VISISTAT 35W (STAPLE) IMPLANT
SUT MNCRL AB 3-0 PS2 18 (SUTURE) ×2 IMPLANT
SUT VIC AB 3-0 SH 8-18 (SUTURE) ×2 IMPLANT
TAPE CLOTH 3X10 TAN LF (GAUZE/BANDAGES/DRESSINGS) ×2 IMPLANT
TOWEL GREEN STERILE (TOWEL DISPOSABLE) ×2 IMPLANT
TOWEL GREEN STERILE FF (TOWEL DISPOSABLE) ×2 IMPLANT
WATER STERILE IRR 1000ML POUR (IV SOLUTION) ×2 IMPLANT

## 2020-11-28 NOTE — [place] FL2 (Signed)
[place] MEDICAID FL2 LEVEL OF CARE SCREENING TOOL     IDENTIFICATION  Patient Name: Michael Jensen Birthdate: 10-Jul-1935 Sex: male Admission Date (Current Location): 11/25/2020  [facility] and [place] Number:  Producer, television/film/video and Address:  [facility] [place] [address], [place], [place] [address]      Provider Number: [id number]  Attending Physician Name and Address:  Elgergawy, Leana Roe, MD  Relative Name and Phone Number:       Current Level of Care: Hospital Recommended Level of Care: Skilled Nursing Facility Prior Approval Number:    Date Approved/Denied:   PASRR Number: [id number]  Discharge Plan: SNF    Current Diagnoses: Patient Active Problem List   Diagnosis Date Noted   Acute lower UTI 11/25/2020   Acute urinary retention 11/25/2020   Spinal stenosis of lumbar region at multiple levels 11/25/2020   Spinal stenosis, cervicothoracic region 11/25/2020   Benign prostatic hyperplasia without lower urinary tract symptoms 03/04/2020   Diabetic peripheral neuropathy associated with type 2 diabetes mellitus (HCC) 03/04/2020   Essential hypertension 03/04/2020   Gastroesophageal reflux disease without esophagitis 03/04/2020   Glaucoma 03/04/2020   Mixed hyperlipidemia 03/04/2020   Osteoarthritis of knee 03/04/2020   Thrombocytopenia (HCC) 03/04/2020    Orientation RESPIRATION BLADDER Height & Weight     Self, Time, Situation, Place  Normal Incontinent, Indwelling catheter Weight: 197 lb 5 oz (89.5 kg) Height:  5\' 9"  (175.3 cm)  BEHAVIORAL SYMPTOMS/MOOD NEUROLOGICAL BOWEL NUTRITION STATUS      Continent Diet (see dc summary)  AMBULATORY STATUS COMMUNICATION OF NEEDS Skin   Extensive Assist Verbally Normal                       Personal Care Assistance Level of Assistance  Bathing, Feeding, Dressing Bathing Assistance: Maximum assistance Feeding assistance: Limited assistance Dressing Assistance: Limited assistance      Functional Limitations Info  Sight Sight Info: Impaired        SPECIAL CARE FACTORS FREQUENCY  PT (By licensed PT), OT (By licensed OT)     PT Frequency: 5x/week OT Frequency: 5x/week            Contractures Contractures Info: Not present    Additional Factors Info  Code Status, Allergies, Insulin Sliding Scale Code Status Info: Full Allergies Info: NKA   Insulin Sliding Scale Info: see dc summary       Current Medications (11/28/2020):  This is the current hospital active medication list Current Facility-Administered Medications  Medication Dose Route Frequency Provider Last Rate Last Admin   0.9 %  sodium chloride infusion  250 mL Intravenous PRN Norins, 01/28/2021, MD       0.9 %  sodium chloride infusion   Intravenous Continuous Elgergawy, Rosalyn Gess, MD 50 mL/hr at 11/28/20 1541 New Bag at 11/28/20 1541   acetaminophen (TYLENOL) tablet 650 mg  650 mg Oral Q6H PRN Elgergawy, 01/28/21, MD       albuterol (PROVENTIL) (2.5 MG/3ML) 0.083% nebulizer solution 2.5 mg  2.5 mg Nebulization Q4H PRN Zacheriah, Stumpe, RPH       [START ON 11/29/2020] aspirin EC tablet 81 mg  81 mg Oral Daily Elgergawy, 12/01/2020, MD       atorvastatin (LIPITOR) tablet 40 mg  40 mg Oral Daily Norins, Leana Roe, MD   40 mg at 11/27/20 1000   brimonidine (ALPHAGAN) 0.2 % ophthalmic solution 1 drop  1 drop Right Eye BID Norins, 01/27/21,  MD   1 drop at 11/27/20 2101   cefTRIAXone (ROCEPHIN) 1 g in sodium chloride 0.9 % 100 mL IVPB  1 g Intravenous Daily Elgergawy, Leana Roe, MD   Stopped at 11/28/20 1001   Chlorhexidine Gluconate Cloth 2 % PADS 6 each  6 each Topical Daily Norins, Rosalyn Gess, MD   6 each at 11/28/20 0941   dorzolamide (TRUSOPT) 2 % ophthalmic solution 1 drop  1 drop Right Eye BID Norins, Rosalyn Gess, MD   1 drop at 11/27/20 2101   HYDROmorphone (DILAUDID) injection 0.5-1 mg  0.5-1 mg Intravenous Q2H PRN Norins, Rosalyn Gess, MD       insulin aspart (novoLOG) injection 0-15 Units  0-15 Units  Subcutaneous TID WC Norins, Rosalyn Gess, MD   2 Units at 11/28/20 0943   latanoprost (XALATAN) 0.005 % ophthalmic solution 1 drop  1 drop Right Eye QHS Norins, Rosalyn Gess, MD   1 drop at 11/26/20 0016   lisinopril (ZESTRIL) tablet 10 mg  10 mg Oral Daily Norins, Rosalyn Gess, MD   10 mg at 11/27/20 1000   loratadine (CLARITIN) tablet 10 mg  10 mg Oral Daily Norins, Rosalyn Gess, MD   10 mg at 11/27/20 1000   metoprolol tartrate (LOPRESSOR) tablet 25 mg  25 mg Oral BID WC Norins, Rosalyn Gess, MD   25 mg at 11/27/20 0854   pantoprazole (PROTONIX) EC tablet 40 mg  40 mg Oral Daily Norins, Rosalyn Gess, MD   40 mg at 11/27/20 1000   senna (SENOKOT) tablet 8.6 mg  1 tablet Oral BID Jacques Navy, MD   8.6 mg at 11/27/20 2100   sodium chloride flush (NS) 0.9 % injection 3 mL  3 mL Intravenous Q12H Norins, Rosalyn Gess, MD   3 mL at 11/27/20 2101   sodium chloride flush (NS) 0.9 % injection 3 mL  3 mL Intravenous PRN Norins, Rosalyn Gess, MD       tamsulosin [facility]) capsule 0.4 mg  0.4 mg Oral BID Jacques Navy, MD   0.4 mg at 11/27/20 2101     Discharge Medications: Please see discharge summary for a list of discharge medications.  Relevant Imaging Results:  Relevant Lab Results:   Additional Information SSN: [id number]. Pfizer COVID-19 Vaccine 06/27/2019 , 05/19/2019  Mearl Latin, LCSW

## 2020-11-28 NOTE — Anesthesia Preprocedure Evaluation (Addendum)
Anesthesia Evaluation  Patient identified by MRN, date of birth, ID band Patient awake    Reviewed: Allergy & Precautions, H&P , NPO status , Patient's Chart, lab work & pertinent test results, reviewed documented beta blocker date and time   Airway Mallampati: II   Neck ROM: limited    Dental   Pulmonary neg pulmonary ROS, former smoker,    breath sounds clear to auscultation       Cardiovascular Exercise Tolerance: Good hypertension, Pt. on medications and Pt. 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  Neuromuscular disease negative psych ROS   GI/Hepatic Neg liver ROS, GERD  Medicated,  Endo/Other  negative endocrine ROSdiabetes  Renal/GU negative Renal ROS  negative genitourinary   Musculoskeletal  (+) Arthritis , Osteoarthritis,    Abdominal   Peds  Hematology  (+) Blood dyscrasia, anemia ,   Anesthesia Other Findings   Reproductive/Obstetrics negative OB ROS                            Anesthesia Physical Anesthesia Plan  ASA: 3  Anesthesia Plan: General   Post-op Pain Management:    Induction: Intravenous  PONV Risk Score and Plan: 2 and Ondansetron and Treatment may vary due to age or medical condition  Airway Management Planned: Oral ETT  Additional Equipment:   Intra-op Plan:   Post-operative Plan: Extubation in OR  Informed Consent: I have reviewed the patients History and Physical, chart, labs and discussed the procedure including the risks, benefits and alternatives for the proposed anesthesia with the patient or authorized representative who has indicated his/her understanding and acceptance.     Dental Advisory Given  Plan Discussed with: CRNA, Anesthesiologist and Surgeon  Anesthesia Plan Comments:        Anesthesia Quick Evaluation

## 2020-11-28 NOTE — Progress Notes (Signed)
PROGRESS NOTE    Michael Jensen  OLM:[id number] DOB: 28-Feb-1936 DOA: 11/25/2020 PCP: Soundra Pilon, FNP     Chief Complaint  Patient presents with   urine retention   Flank Pain   Constipation    Brief Narrative:   HPI: Michael Jensen is a 85 y.o. male with medical history significant of hypertension, hyperlipidemia, BPH and diabetes who is presenting today with 3 days of inability to have a bowel movement and complaints of urinary retention.  As well patient with progressive weakness over the last few month, his work-up is significant for UTI/urinary retention, and significant spinal stenosis.    Assessment & Plan:   Active Problems:   Benign prostatic hyperplasia without lower urinary tract symptoms   Diabetic peripheral neuropathy associated with type 2 diabetes mellitus (HCC)   Essential hypertension   Gastroesophageal reflux disease without esophagitis   Acute lower UTI   Acute urinary retention   Spinal stenosis of lumbar region at multiple levels   Spinal stenosis, cervicothoracic region   Spinal stenosis/bilateral weakness right > left -Neurosurgery input greatly appreciated, MRI cervical/thoracic/lumbar spine notable for significant degenerative disease with stenosis at multiple level, but worst at C7-T1, where severe stenosis causes some cord signal abnormalities. -Plan for surgical correction in the form of ACDF, as current symptoms may be related to C7/T1 cord spinal stenosis -Plan for OR today  Urinary retention -May be multifactorial in the setting of BPH, UTI, as well possibly his spinal stenosis contributing to this finding as discussed with neurosurgery. -Continue with tamsulosin -For now continue with Foley catheter, plan for voiding trials from tomorrow.   Acute UTI  -Continue with Rocephin   Diabetes - last A1C 2019. Nl renal function Plan     Continue metformin             A1C             SS coverage   HTN- continue home meds  Right lower  extremity edema-venous Dopplers negative for DVT  DVT prophylaxis: Heparin Code Status: Full Family Communication: none at bedside Disposition:   Status is: Inpatient  Remains inpatient appropriate because:IV treatments appropriate due to intensity of illness or inability to take PO  Dispo: The patient is from: Home              Anticipated d/c is to: Home              Patient currently is not medically stable to d/c.   Difficult to place patient No       Consultants:  Neurosurgery  Subjective:  Significant events overnight, patient himself denies any complaints.  Objective: Vitals:   11/27/20 2355 11/28/20 0330 11/28/20 0758 11/28/20 1204  BP: 135/74 132/80 118/67 129/79  Pulse: 76 75 76 98  Resp: 16  15 16   Temp: 98.8 F (37.1 C) 98.2 F (36.8 C) 98.6 F (37 C) 97.8 F (36.6 C)  TempSrc:  Oral Oral Oral  SpO2: 99% 99% 99% 98%  Weight:  89.5 kg    Height:        Intake/Output Summary (Last 24 hours) at 11/28/2020 1411 Last data filed at 11/28/2020 0715 Gross per 24 hour  Intake --  Output 2450 ml  Net -2450 ml   Filed Weights   11/25/20 0823 11/28/20 0330  Weight: 89.8 kg 89.5 kg    Examination:   Awake Alert, Oriented X 3, No new F.N deficits, Normal affect, legally blind  symmetrical Chest wall movement, Good  air movement bilaterally, CTAB RRR,No Gallops,Rubs or new Murmurs, No Parasternal Heave +ve B.Sounds, Abd Soft, No tenderness, No rebound - guarding or rigidity. No Cyanosis, Clubbing, right lower extremity edema    Data Reviewed: I have personally reviewed following labs and imaging studies  CBC: Recent Labs  Lab 11/25/20 0856 11/27/20 0225  WBC 7.0 6.6  NEUTROABS 5.6  --   HGB 13.7 12.5*  HCT 41.7 38.3*  MCV 82.9 83.6  PLT 130* 125*    Basic Metabolic Panel: Recent Labs  Lab 11/25/20 0856 11/26/20 0051 11/27/20 0225  NA 137 139 138  K 3.9 3.9 4.1  CL 104 106 107  CO2 24 24 23   GLUCOSE 166* 139* 158*  BUN 21 15 17    CREATININE 0.90 0.85 0.72  CALCIUM 9.2 9.0 9.1    GFR: Estimated Creatinine Clearance: 77.4 mL/min (by C-G formula based on SCr of 0.72 mg/dL).  Liver Function Tests: No results for input(s): AST, ALT, ALKPHOS, BILITOT, PROT, ALBUMIN in the last 168 hours.  CBG: Recent Labs  Lab 11/27/20 1137 11/27/20 1550 11/27/20 2108 11/28/20 0803 11/28/20 1159  GLUCAP 185* 152* 154* 129* 127*     Recent Results (from the past 240 hour(s))  Urine Culture     Status: Abnormal   Collection Time: 11/25/20  8:56 AM   Specimen: Urine, Catheterized  Result Value Ref Range Status   Specimen Description   Final    URINE, CATHETERIZED Performed at [facility], [address] [place], [place] [address]    Special Requests   Final    NONE Performed at [facility], [address] [place], [place] [address]    Culture (A)  Final    >=100,000 COLONIES/mL ENTEROBACTER AEROGENES >=100,000 COLONIES/mL AEROCOCCUS URINAE    Report Status 11/28/2020 FINAL  Final   Organism ID, Bacteria ENTEROBACTER AEROGENES (A)  Final      Susceptibility   Enterobacter aerogenes - MIC*    CEFAZOLIN RESISTANT Resistant     CEFEPIME <=0.12 SENSITIVE Sensitive     CEFTRIAXONE <=0.25 SENSITIVE Sensitive     CIPROFLOXACIN <=0.25 SENSITIVE Sensitive     GENTAMICIN <=1 SENSITIVE Sensitive     IMIPENEM <=0.25 SENSITIVE Sensitive     NITROFURANTOIN <=16 SENSITIVE Sensitive     TRIMETH/SULFA <=20 SENSITIVE Sensitive     PIP/TAZO <=4 SENSITIVE Sensitive     * >=100,000 COLONIES/mL ENTEROBACTER AEROGENES  SARS CORONAVIRUS 2 (TAT 6-24 HRS) Nasopharyngeal Nasopharyngeal Swab     Status: None   Collection Time: 11/25/20  4:53 PM   Specimen: Nasopharyngeal Swab  Result Value Ref Range Status   SARS Coronavirus 2 NEGATIVE NEGATIVE Final    Comment: (NOTE) SARS-CoV-2 target nucleic acids are NOT DETECTED.  The SARS-CoV-2 RNA is generally detectable in upper and lower respiratory  specimens during the acute phase of infection. Negative results do not preclude SARS-CoV-2 infection, do not rule out co-infections with other pathogens, and should not be used as the sole basis for treatment or other patient management decisions. Negative results must be combined with clinical observations, patient history, and epidemiological information. The expected result is Negative.  Fact Sheet for Patients: HairSlick.nohttps://www.fda.gov/media/138098/download  Fact Sheet for Healthcare Providers: quierodirigir.comhttps://www.fda.gov/media/138095/download  This test is not yet approved or cleared by the [place] FDA and  has been authorized for detection and/or diagnosis of SARS-CoV-2 by FDA under an Emergency Use Authorization (EUA). This EUA will remain  in effect (meaning this test can be used) for the  duration of the COVID-19 declaration under Se ction 564(b)(1) of the Act, 21 U.S.C. section 360bbb-3(b)(1), unless the authorization is terminated or revoked sooner.  Performed at [facility], [address], [place], [place] [address]          Radiology Studies: VAS [place] LOWER EXTREMITY VENOUS (DVT)  Result Date: 11/27/2020  Lower Venous DVT Study Patient Name:  SHEEHAN STACEY  Date of Exam:   11/27/2020 Medical Rec #: [id number]      Accession #:    [id number] Date of Birth: Jul 27, 1935      Patient Gender: M Patient Age:   41 years Exam Location:  [facility] Procedure:      VAS [place] LOWER EXTREMITY VENOUS (DVT) Referring Phys: Keatyn Luck --------------------------------------------------------------------------------  Indications: Edema.  Risk Factors: None identified. Limitations: Poor ultrasound/tissue interface. Comparison Study: No prior studies. Performing Technologist: Chanda Busing RVT  Examination Guidelines: A complete evaluation includes B-mode imaging, spectral Doppler, color Doppler, and power Doppler as needed of all accessible portions of each vessel. Bilateral  testing is considered an integral part of a complete examination. Limited examinations for reoccurring indications may be performed as noted. The reflux portion of the exam is performed with the patient in reverse Trendelenburg.  +---------+---------------+---------+-----------+----------+--------------+ RIGHT    CompressibilityPhasicitySpontaneityPropertiesThrombus Aging +---------+---------------+---------+-----------+----------+--------------+ CFV      Full           Yes      Yes                                 +---------+---------------+---------+-----------+----------+--------------+ SFJ      Full                                                        +---------+---------------+---------+-----------+----------+--------------+ FV Prox  Full                                                        +---------+---------------+---------+-----------+----------+--------------+ FV Mid   Full                                                        +---------+---------------+---------+-----------+----------+--------------+ FV DistalFull                                                        +---------+---------------+---------+-----------+----------+--------------+ PFV      Full                                                        +---------+---------------+---------+-----------+----------+--------------+ POP      Full  Yes      Yes                                 +---------+---------------+---------+-----------+----------+--------------+ PTV      Full                                                        +---------+---------------+---------+-----------+----------+--------------+ PERO     Full                                                        +---------+---------------+---------+-----------+----------+--------------+   +---------+---------------+---------+-----------+----------+-------------------+ LEFT      CompressibilityPhasicitySpontaneityPropertiesThrombus Aging      +---------+---------------+---------+-----------+----------+-------------------+ CFV      Full           Yes      Yes                                      +---------+---------------+---------+-----------+----------+-------------------+ SFJ      Full                                                             +---------+---------------+---------+-----------+----------+-------------------+ FV Prox  Full                                                             +---------+---------------+---------+-----------+----------+-------------------+ FV Mid   Full                                                             +---------+---------------+---------+-----------+----------+-------------------+ FV Distal               Yes      Yes                                      +---------+---------------+---------+-----------+----------+-------------------+ PFV      Full                                                             +---------+---------------+---------+-----------+----------+-------------------+ POP      Full           Yes      Yes                                      +---------+---------------+---------+-----------+----------+-------------------+  PTV      Full                                                             +---------+---------------+---------+-----------+----------+-------------------+ PERO                                                  Not well visualized +---------+---------------+---------+-----------+----------+-------------------+    Summary: RIGHT: - There is no evidence of deep vein thrombosis in the lower extremity. However, portions of this examination were limited- see technologist comments above.  - No cystic structure found in the popliteal fossa.  LEFT: - There is no evidence of deep vein thrombosis in the lower extremity. However, portions of this  examination were limited- see technologist comments above.  - No cystic structure found in the popliteal fossa.  *See table(s) above for measurements and observations.    Preliminary         Scheduled Meds:  acetaminophen  1,000 mg Oral Q8H   [START ON 11/29/2020] aspirin EC  81 mg Oral Daily   atorvastatin  40 mg Oral Daily   brimonidine  1 drop Right Eye BID   Chlorhexidine Gluconate Cloth  6 each Topical Daily   dorzolamide  1 drop Right Eye BID   insulin aspart  0-15 Units Subcutaneous TID WC   latanoprost  1 drop Right Eye QHS   lisinopril  10 mg Oral Daily   loratadine  10 mg Oral Daily   metFORMIN  500 mg Oral BID WC   metoprolol tartrate  25 mg Oral BID WC   pantoprazole  40 mg Oral Daily   senna  1 tablet Oral BID   sodium chloride flush  3 mL Intravenous Q12H   tamsulosin  0.4 mg Oral BID   Continuous Infusions:  sodium chloride     cefTRIAXone (ROCEPHIN)  IV 1 g (11/28/20 0941)     LOS: 3 days       Huey Bienenstock, MD [facility]   To contact the attending provider between 7A-7P or the covering provider during after hours 7P-7A, please log into the web site [email or web address] and access using universal [place] password for that web site. If you do not have the password, please call the hospital operator.  11/28/2020, 2:11 PM

## 2020-11-28 NOTE — Op Note (Signed)
PATIENT: Michael Jensen  PROCEDURE DATE: 11/28/20  PRE-OPERATIVE DIAGNOSIS:  Cervical myelopathy   POST-OPERATIVE DIAGNOSIS:  Same   PROCEDURE:  C7-T1 Anterior Cervical Discectomy and Instrumented Fusion   SURGEON:  Surgeon(s) and Role:    Jadene Pierini, MD - Primary   ANESTHESIA: ETGA   BRIEF HISTORY: This is an 85 year old man who presented with progressive BLE weakness. He had severe stenosis with cord signal change at C7-T1. We discussed that this could be the cause of his weakness, but given the duration of symptoms and atypical presentation, would have to see if decompression helped his symptoms. This was discussed with the patient as well as risks, benefits, and alternatives and the patient wished to proceed with surgical treatment.   OPERATIVE DETAIL: The patient was taken to the operating room and placed on the OR table in the supine position. A formal time out was performed with two patient identifiers and confirmed the operative site. Anesthesia was induced by the anesthesia team.  Fluoroscopy was used to localize the surgical level and an incision was marked in a skin crease. The area was then prepped and draped in a sterile fashion. A transverse linear incision was made on the left side of the neck. The platysma was divided and the sternocleidomastoid muscle was identified. The carotid sheath was palpated, identified, and retracted laterally with the sternocleidomastoid muscle. The strap muscles were identified and retracted medially and the pretracheal fascia was entered. A bent spinal needle was used with fluoroscopy to localize the surgical level after dissection. The patient's shoulders had limited mobility and could not be retracted to get good enough views to reliably localize. I therefore dissected up to C4-5, the next unfused disc space, and placed a spinal needle here, then counted down from C4-5 after confirming its location. The longus colli were elevated bilaterally and  a self-retaining retractor was placed. The endotracheal tube cuff balloon was deflated and reinflated after retractor placement.   Anterior osteophytes were removed until flush with the anterior vertebral body. The disc annulus was incised and a complete C7-T1 discectomy was performed. The posterior longitudinal ligament was incised followed by ligamentous and bony removal until no central canal stenosis was present. A 43mm cortical allograft ([employer]) was inserted into the disc space as an interbody graft. An anterior plate ([employer]) was positioned and 4, 11mm screws were used to secure the plate to the C7 and T1 vertebral bodies. Hemostasis was obtained and the incision was closed in layers. All instrument and sponge counts were correct. The patient was then returned to anesthesia for emergence. No apparent complications at the completion of the procedure.   EBL:  69mL   DRAINS: none   SPECIMENS: none   Jadene Pierini, MD 11/28/20 9:20 PM

## 2020-11-28 NOTE — Transfer of Care (Signed)
Immediate Anesthesia Transfer of Care Note  Patient: Michael Jensen  Procedure(s) Performed: ANTERIOR CERVICAL DECOMPRESSION/DISCECTOMY FUSION  CERVICAL SEVEN(C7)-THORACIC ONE(T1) (Neck)  Patient Location: PACU  Anesthesia Type:General  Level of Consciousness: AAAX3  Airway & Oxygen Therapy: Patient Spontanous Breathing and Patient connected to face mask oxygen  Post-op Assessment: Report given to RN and Post -op Vital signs reviewed and stable  Post vital signs: Reviewed and stable  Last Vitals:  Vitals Value Taken Time  BP 154/84 11/28/20 2123  Temp    Pulse 75 11/28/20 2125  Resp 15 11/28/20 2125  SpO2 100 % 11/28/20 2125  Vitals shown include unvalidated device data.  Last Pain:  Vitals:   11/28/20 1833  TempSrc: Oral  PainSc: 0-No pain         Complications: No notable events documented.

## 2020-11-28 NOTE — Anesthesia Procedure Notes (Signed)
Procedure Name: Intubation Date/Time: 11/28/2020 7:31 PM Performed by: Oletta Lamas, CRNA Pre-anesthesia Checklist: Patient identified, Emergency Drugs available, Suction available and Patient being monitored Patient Re-evaluated:Patient Re-evaluated prior to induction Oxygen Delivery Method: Circle System Utilized Preoxygenation: Pre-oxygenation with 100% oxygen Induction Type: IV induction Ventilation: Mask ventilation without difficulty Laryngoscope Size: Mac and 4 Grade View: Grade I Tube type: Oral Number of attempts: 1 Airway Equipment and Method: Stylet and Oral airway Placement Confirmation: ETT inserted through vocal cords under direct vision, positive ETCO2 and breath sounds checked- equal and bilateral Secured at: 23 cm Tube secured with: Tape Dental Injury: Teeth and Oropharynx as per pre-operative assessment  Comments: Head and neck maintained midline fr laryngoscopy   Positioned by surgeon for surgery on doughnut gel pillow

## 2020-11-28 NOTE — Progress Notes (Addendum)
Pt transferred to pre-op for surgery, sbar provided to pre-op nurse, beta blocker provided, dentures removed and placed on patient's sink, indwelling foley in place, continuous infusion saline locked, chg and foley care provided, teeth brushed, pre-op checklist completed, daughters contact information verified for consent.

## 2020-11-28 NOTE — Progress Notes (Signed)
Neurosurgery Service Progress Note  Subjective: No acute events overnight, no new complaints  Objective: Vitals:   11/27/20 1554 11/27/20 2021 11/27/20 2355 11/28/20 0330  BP: 106/65 106/69 135/74 132/80  Pulse: 60 72 76 75  Resp: 16 16 16    Temp: 98.1 F (36.7 C) 98.8 F (37.1 C) 98.8 F (37.1 C) 98.2 F (36.8 C)  TempSrc: Oral Oral  Oral  SpO2: 100% 100% 99% 99%  Weight:    89.5 kg  Height:        Physical Exam: Strength 4+ to 5/5 in BUE BLE with increased tone bilaterally, RLE is 1-2/5 diffusely, LLE is 3/5 to 4-/5 diffusely, reflexes 1+ in BUE, 3+ at the right patella, 1+ at the left patella  Assessment & Plan: 85 y.o. man w/ progressive BLE weakness, R>L. 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  -OR today for ACDF  91  11/28/20 7:44 AM

## 2020-11-28 NOTE — Progress Notes (Signed)
Neurosurgery Service Post-operative progress note  Assessment & Plan: 85 y.o. man s/p C7-T1 ACDF.   -preop orders restarted, pt is on Dr. Teena Irani service -activity and diet as tolerated, no restrictions -no c-collar needed  Jadene Pierini  11/28/20 9:25 PM

## 2020-11-28 NOTE — Care Management Important Message (Signed)
Important Message  Patient Details  Name: Spike Desilets MRN: [id number] Date of Birth: 22-Jan-1936   Medicare Important Message Given:  Yes     Dorena Bodo 11/28/2020, 4:21 PM

## 2020-11-29 LAB — GLUCOSE, CAPILLARY
Glucose-Capillary: 169 mg/dL — ABNORMAL HIGH (ref 70–99)
Glucose-Capillary: 243 mg/dL — ABNORMAL HIGH (ref 70–99)
Glucose-Capillary: 213 mg/dL — ABNORMAL HIGH (ref 70–99)
Glucose-Capillary: 162 mg/dL — ABNORMAL HIGH (ref 70–99)
Glucose-Capillary: 170 mg/dL — ABNORMAL HIGH (ref 70–99)

## 2020-11-29 LAB — CBC
HCT: 39.4 % (ref 39.0–52.0)
Hemoglobin: 12.5 g/dL — ABNORMAL LOW (ref 13.0–17.0)
MCH: 26.6 pg (ref 26.0–34.0)
MCHC: 31.7 g/dL (ref 30.0–36.0)
MCV: 83.8 fL (ref 80.0–100.0)
Platelets: 138 10*3/uL — ABNORMAL LOW (ref 150–400)
RBC: 4.7 MIL/uL (ref 4.22–5.81)
RDW: 14.8 % (ref 11.5–15.5)
WBC: 5.8 10*3/uL (ref 4.0–10.5)
nRBC: 0 % (ref 0.0–0.2)

## 2020-11-29 LAB — BASIC METABOLIC PANEL
Anion gap: 6 (ref 5–15)
BUN: 13 mg/dL (ref 8–23)
CO2: 28 mmol/L (ref 22–32)
Calcium: 9.1 mg/dL (ref 8.9–10.3)
Chloride: 102 mmol/L (ref 98–111)
Creatinine, Ser: 0.72 mg/dL (ref 0.61–1.24)
GFR, Estimated: >60 mL/min (ref >60)
Glucose, Bld: 178 mg/dL — ABNORMAL HIGH (ref 70–99)
Potassium: 4.3 mmol/L (ref 3.5–5.1)
Sodium: 136 mmol/L (ref 135–145)

## 2020-11-29 NOTE — Anesthesia Postprocedure Evaluation (Signed)
Anesthesia Post Note  Patient: Laquincy Eastridge  Procedure(s) Performed: ANTERIOR CERVICAL DECOMPRESSION/DISCECTOMY FUSION  CERVICAL SEVEN(C7)-THORACIC ONE(T1) (Neck)     Patient location during evaluation: PACU Anesthesia Type: General Level of consciousness: awake and alert Pain management: pain level controlled Vital Signs Assessment: post-procedure vital signs reviewed and stable Respiratory status: spontaneous breathing, nonlabored ventilation, respiratory function stable and patient connected to nasal cannula oxygen Cardiovascular status: blood pressure returned to baseline and stable Postop Assessment: no apparent nausea or vomiting Anesthetic complications: no   No notable events documented.  Last Vitals:  Vitals:   11/29/20 0030 11/29/20 0100  BP: (!) 102/57 109/61  Pulse: 83 63  Resp:    Temp:    SpO2: 92% 96%    Last Pain:  Vitals:   11/29/20 0000  TempSrc: Oral  PainSc:                  Angely Dietz S

## 2020-11-29 NOTE — TOC Initial Note (Addendum)
Transition of Care [facility]) - Initial/Assessment Note    Patient Details  Name: Michael Jensen MRN: [id number] Date of Birth: 1935-12-23  [facility]) CM/SW Contact:    Michael Latin, LCSW Phone Number: 11/29/2020, 12:24 PM  Clinical Narrative:                 CSW received consult for possible SNF placement at time of discharge. CSW spoke with patient. Patient reported that patient's spouse is currently unable to care for patient at their home given patient's current physical needs and fall risk. Patient expressed understanding of PT recommendation and is agreeable to SNF placement at time of discharge. CSW discussed insurance authorization process and will provide Medicare SNF ratings list. Patient has received 2 COVID vaccines. Patient expressed being hopeful for rehab and to feel better soon. He requested CSW contact his daughter to discuss bed offers. CSW left her a voicemail.   Update: CSW spoke with patient's daughter, Michael Jensen. She requested a facility near their house such as [facility] or [facility] though it is a little farther away. CSW requested [facility] review referral.   Skilled Nursing Rehab Facilities-   [email or web address] Ratings out of 5 possible    Name Address  Phone # Quality Care Staffing Health Inspection Overall  [facility] [address] [place] [phone] 5 1 4 4   [facility]  [address], [place] [phone] 3 1 5 4   [facility] [address] [place], [address] [place] 3 1 1 1   [facility] [address] 2 2 4 4   [facility] [address] [phone] 3 1 2 1   [facility] [address], [place] [phone] 3 2 4 4   [facility] [address], [address] [place] 5 1 2 2   [facility] [address], [facility] [place] 5 2 2 3   [facility] [address]  [facility] 5 1 2 2   [facility] [phone], [phone] 5 1 2 2   [facility] [address] [facility] [phone] 5 1 2 2   [facility]. [address] 3 1 1 1      Expected Discharge Plan: Skilled Nursing Facility Barriers to Discharge: Insurance Authorization, SNF Pending bed offer, Continued Medical Work up   Patient Goals and CMS Choice Patient states their goals for this hospitalization and ongoing recovery are:: Rehab CMS Medicare.gov Compare Post Acute Care list provided to:: Patient Choice offered to / list presented to : Patient, Adult Children  Expected Discharge Plan and Services Expected Discharge Plan: Skilled Nursing Facility In-house Referral: Clinical Social Work   Post Acute Care Choice: Skilled Nursing Facility Living arrangements for the past 2 months: Single Family Home                                      Prior Living Arrangements/Services Living arrangements for the past 2 months: Single Family Home Lives with:: Spouse Patient language and need for interpreter reviewed:: Yes Do you feel safe going back to the place where you live?: Yes      Need for Family Participation in Patient Care: Yes (Comment) Care giver support system in place?: Yes (comment)   Criminal Activity/Legal Involvement Pertinent to Current Situation/Hospitalization: No - Comment as needed  Activities of Daily Living Home Assistive Devices/Equipment: Walker (specify type),  CBG Meter ADL Screening (condition at time of admission) Patient's cognitive ability adequate to safely complete daily activities?: Yes Is the patient deaf or have difficulty hearing?: No Does the patient have difficulty seeing, even when wearing glasses/contacts?: Yes (patient is blind) Does the patient have difficulty concentrating, remembering, or making decisions?: No Patient able to express need for assistance with ADLs?:  Yes Does the patient have difficulty dressing or bathing?: Yes Independently performs ADLs?: No Communication: Independent Dressing (OT): Needs assistance Is this a change from baseline?: Pre-admission baseline Grooming: Independent Feeding: Independent Bathing: Needs assistance Is this a change from baseline?: Pre-admission baseline Toileting: Needs assistance Is this a change from baseline?: Pre-admission baseline In/Out Bed: Needs assistance Is this a change from baseline?: Pre-admission baseline Walks in Home: Needs assistance Is this a change from baseline?: Pre-admission baseline Does the patient have difficulty walking or climbing stairs?: Yes (secondary to weakness) Weakness of Legs: Both Weakness of Arms/Hands: Both  Permission Sought/Granted Permission sought to share information with : Facility Medical sales representative, Family Supports Permission granted to share information with : Yes, Verbal Permission Granted  Share Information with NAME: Michael Jensen  Permission granted to share info w AGENCY: SNFs  Permission granted to share info w Relationship: Daughter  Permission granted to share info w Contact Information: [phone]  Emotional Assessment Appearance:: Appears stated age Attitude/Demeanor/Rapport: Engaged Affect (typically observed): Accepting, Appropriate, Pleasant Orientation: : Oriented to Self, Oriented to Place, Oriented to  Time, Oriented to Situation Alcohol / Substance Use: Not Applicable Psych Involvement: No (comment)  Admission diagnosis:  Lower urinary tract infectious disease [N39.0] Urinary retention [R33.9] Muscle weakness of lower extremity [M62.81] Acute urinary retention [R33.8] Patient Active Problem List   Diagnosis Date Noted   Acute lower UTI 11/25/2020   Acute urinary retention 11/25/2020   Spinal stenosis of lumbar region at multiple levels 11/25/2020   Spinal stenosis, cervicothoracic region 11/25/2020   Benign prostatic  hyperplasia without lower urinary tract symptoms 03/04/2020   Diabetic peripheral neuropathy associated with type 2 diabetes mellitus (HCC) 03/04/2020   Essential hypertension 03/04/2020   Gastroesophageal reflux disease without esophagitis 03/04/2020   Glaucoma 03/04/2020   Mixed hyperlipidemia 03/04/2020   Osteoarthritis of knee 03/04/2020   Thrombocytopenia (HCC) 03/04/2020   PCP:  Soundra Pilon, FNP Pharmacy:   [id number] [place], [place] - [address] [place] [address] Phone: [phone] Fax: [phone]     Social Determinants of Health (SDOH) Interventions    Readmission Risk Interventions No flowsheet data found.

## 2020-11-29 NOTE — Progress Notes (Signed)
Physical Therapy Treatment Patient Details Name: Michael Jensen MRN: [id number] DOB: 1935-12-28 Today's Date: 11/29/2020   History of Present Illness 85 y.o. male with medical history significant of hypertension, hyperlipidemia, BPH and diabetes who is presenting today with 3 days of inability to have a bowel movement and complaints of urinary retention.  Patient reports declining mobility and ambulatory status over the last 3 months.  Pt is s/p C7-T1 discectomy and fusion on 9/12.    PT Comments    Pt progressing towards goals. Required max to total A +2 to stand X3 this session. Pt with flexed posture and unable to achieve full upright positioning. Current recommendations appropriate. Will continue to follow acutely.     Recommendations for follow up therapy are one component of a multi-disciplinary discharge planning process, led by the attending physician.  Recommendations may be updated based on patient status, additional functional criteria and insurance authorization.  Follow Up Recommendations  SNF     Equipment Recommendations  Other (comment) (stair lift or putting bed on 1st level for safety; hoyer lift)    Recommendations for Other Services       Precautions / Restrictions Precautions Precautions: Fall;Cervical Precaution Comments: Per notes, no brace required Restrictions Weight Bearing Restrictions: No     Mobility  Bed Mobility Overal bed mobility: Needs Assistance Bed Mobility: Sit to Supine;Supine to Sit     Supine to sit: Mod assist;+2 for safety/equipment;+2 for physical assistance Sit to supine: Mod assist;+2 for safety/equipment;+2 for physical assistance   General bed mobility comments: Required assist for trunk and LE assist.    Transfers Overall transfer level: Needs assistance Equipment used: 2 person hand held assist Transfers: Sit to/from Stand Sit to Stand: Max assist;+2 physical assistance         General transfer comment: Required  blocking of bilateral knees. Performed standing X3. Pt with very flexed posture and unable to achieve full upright. was unable to take steps towards chair.  Ambulation/Gait                 Stairs             Wheelchair Mobility    Modified Rankin (Stroke Patients Only)       Balance Overall balance assessment: Needs assistance Sitting-balance support: Feet supported;Bilateral upper extremity supported Sitting balance-Leahy Scale: Poor Sitting balance - Comments: reliant on UE support of bed and min to min guard for balance Postural control: Posterior lean Standing balance support: Bilateral upper extremity supported Standing balance-Leahy Scale: Zero Standing balance comment: totalA to maintain standing                            Cognition Arousal/Alertness: Awake/alert Behavior During Therapy: WFL for tasks assessed/performed Overall Cognitive Status: No family/caregiver present to determine baseline cognitive functioning                                        Exercises General Exercises - Lower Extremity Heel Slides: AAROM;Both;10 reps;Supine    General Comments        Pertinent Vitals/Pain Pain Assessment: Faces Faces Pain Scale: Hurts little more Pain Location: neck Pain Descriptors / Indicators: Guarding;Grimacing Pain Intervention(s): Limited activity within patient's tolerance;Monitored during session;Repositioned    Home Living  Prior Function            PT Goals (current goals can now be found in the care plan section) Acute Rehab PT Goals Patient Stated Goal: to improve mobility quality and regain some independence PT Goal Formulation: With patient Time For Goal Achievement: 12/11/20 Potential to Achieve Goals: Fair Progress towards PT goals: Progressing toward goals    Frequency    Min 3X/week      PT Plan Current plan remains appropriate    Co-evaluation               AM-PAC PT "6 Clicks" Mobility   Outcome Measure  Help needed turning from your back to your side while in a flat bed without using bedrails?: A Lot Help needed moving from lying on your back to sitting on the side of a flat bed without using bedrails?: A Lot Help needed moving to and from a bed to a chair (including a wheelchair)?: Total Help needed standing up from a chair using your arms (e.g., wheelchair or bedside chair)?: Total Help needed to walk in hospital room?: Total Help needed climbing 3-5 steps with a railing? : Total 6 Click Score: 8    End of Session Equipment Utilized During Treatment: Gait belt Activity Tolerance: Patient tolerated treatment well Patient left: in bed;with call bell/phone within reach;with bed alarm set (bed in chair position) Nurse Communication: Mobility status;Need for lift equipment PT Visit Diagnosis: Other abnormalities of gait and mobility (R26.89);Muscle weakness (generalized) (M62.81);Other symptoms and signs involving the nervous system (R29.898)     Time: 3545-6256 PT Time Calculation (min) (ACUTE ONLY): 23 min  Charges:  $Therapeutic Activity: 23-37 mins                     Cindee Salt, DPT  Acute Rehabilitation Services  Pager: [phone] Office: [phone]    Lehman Prom 11/29/2020, 4:10 PM

## 2020-11-29 NOTE — Progress Notes (Signed)
Neurosurgery Service Progress Note  Subjective: No acute events overnight, neck pain mild, no dysphagia  Objective: Vitals:   11/29/20 0200 11/29/20 0300 11/29/20 0348 11/29/20 0738  BP: 114/83 134/70 127/70 121/66  Pulse: 64 73 75 79  Resp:   20   Temp:   98.2 F (36.8 C) 97.6 F (36.4 C)  TempSrc:   Axillary Axillary  SpO2: 96% 95% 96%   Weight:      Height:        Physical Exam: Strength 4+ to 5/5 in BUE BLE with increased tone bilaterally, RLE is 1-2/5 diffusely, LLE is 3/5 to 4-/5 diffusely, reflexes 1+ in BUE, 3+ at the right patella, 1+ at the left patella Incision c/d/I, neck soft  Assessment & Plan: 85 y.o. man w/ progressive BLE weakness, R>L. 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 9/12 s/p C7-T1 ACDF  -activity as tolerated, no brace needed -diet as tolerated  Jadene Pierini  11/29/20 7:43 AM

## 2020-11-29 NOTE — Progress Notes (Signed)
PROGRESS NOTE    Michael Jensen  KKX:[id number] DOB: 07/01/1935 DOA: 11/25/2020 PCP: Soundra Pilon, FNP     Chief Complaint  Patient presents with   urine retention   Flank Pain   Constipation    Brief Narrative:   Michael Jensen is a 85 y.o. male with medical history significant of hypertension, hyperlipidemia, BPH and diabetes who is presenting today with 3 days of inability to have a bowel movement and complaints of urinary retention.  As well patient with progressive weakness over the last few month, his work-up is significant for UTI/urinary retention, work-up significant for spinal stenosis at C7-T1 level, seen by neurosurgery, this post ACDF 9/12.  Assessment & Plan:   Active Problems:   Benign prostatic hyperplasia without lower urinary tract symptoms   Diabetic peripheral neuropathy associated with type 2 diabetes mellitus (HCC)   Essential hypertension   Gastroesophageal reflux disease without esophagitis   Acute lower UTI   Acute urinary retention   Spinal stenosis of lumbar region at multiple levels   Spinal stenosis, cervicothoracic region   Spinal stenosis/bilateral weakness right > left -Neurosurgery input greatly appreciated, MRI cervical/thoracic/lumbar spine notable for significant degenerative disease with stenosis at multiple level, but worst at C7-T1, where severe stenosis causes some cord signal abnormalities. -Neurosurgery input greatly appreciated, current symptoms may be related to C7/T1 cord spinal stenosis-S/P  surgical correction in the form of ACDF, as  -Continue with PT/OT, plan for rehab placement.  Urinary retention -May be multifactorial in the setting of BPH, UTI, as well possibly his spinal stenosis contributing to this finding as discussed with neurosurgery. -Continue with tamsulosin -Foley catheter inserted on admission, plan for voiding trial today.   Acute UTI  -Growing Enterobacter aerogenes, Aerococcus urine, continue with Rocephin,  will treat total for 7 days of antibiotics.  Be transitioned to oral on discharge.   Diabetes - last A1C 2019. Nl renal function Plan     Continue metformin             A1C             SS coverage   HTN- continue home meds  Right lower extremity edema-venous Dopplers negative for DVT  DVT prophylaxis: SCD, SQ. heparin DVT prophylaxis currently  on hold, will resume once cleared by neurosurgery Code Status: Full Family Communication: Discussed with daughter by phone. Disposition:   Status is: Inpatient  Remains inpatient appropriate because:IV treatments appropriate due to intensity of illness or inability to take PO  Dispo: The patient is from: Home              Anticipated d/c is to: SNF              Patient currently is not medically stable to d/c.   Difficult to place patient No       Consultants:  Neurosurgery  Subjective:  No significant events overnight, patient denies any complaints.  Objective: Vitals:   11/29/20 0300 11/29/20 0348 11/29/20 0738 11/29/20 1204  BP: 134/70 127/70 121/66 (!) 111/59  Pulse: 73 75 79 91  Resp:  20    Temp:  98.2 F (36.8 C) 97.6 F (36.4 C) 97.8 F (36.6 C)  TempSrc:  Axillary Axillary Axillary  SpO2: 95% 96%  96%  Weight:      Height:        Intake/Output Summary (Last 24 hours) at 11/29/2020 1433 Last data filed at 11/29/2020 0828 Gross per 24 hour  Intake 1815.85 ml  Output  1700 ml  Net 115.85 ml   Filed Weights   11/25/20 0823 11/28/20 0330  Weight: 89.8 kg 89.5 kg    Examination:   Awake Alert, Oriented X 3, No new F.N deficits, Normal affect, patient is legally blind Symmetrical Chest wall movement, Good air movement bilaterally, CTAB RRR,No Gallops,Rubs or new Murmurs, No Parasternal Heave +ve B.Sounds, Abd Soft, No tenderness, No rebound - guarding or rigidity. No Cyanosis, Clubbing, right lower extremity edema.    Data Reviewed: I have personally reviewed following labs and imaging  studies  CBC: Recent Labs  Lab 11/25/20 0856 11/27/20 0225 11/29/20 0209  WBC 7.0 6.6 5.8  NEUTROABS 5.6  --   --   HGB 13.7 12.5* 12.5*  HCT 41.7 38.3* 39.4  MCV 82.9 83.6 83.8  PLT 130* 125* 138*    Basic Metabolic Panel: Recent Labs  Lab 11/25/20 0856 11/26/20 0051 11/27/20 0225 11/29/20 0209  NA 137 139 138 136  K 3.9 3.9 4.1 4.3  CL 104 106 107 102  CO2 24 24 23 28   GLUCOSE 166* 139* 158* 178*  BUN 21 15 17 13   CREATININE 0.90 0.85 0.72 0.72  CALCIUM 9.2 9.0 9.1 9.1    GFR: Estimated Creatinine Clearance: 77.4 mL/min (by C-G formula based on SCr of 0.72 mg/dL).  Liver Function Tests: No results for input(s): AST, ALT, ALKPHOS, BILITOT, PROT, ALBUMIN in the last 168 hours.  CBG: Recent Labs  Lab 11/28/20 1602 11/28/20 1828 11/28/20 2123 11/29/20 0742 11/29/20 1203  GLUCAP 145* 143* 110* 169* 243*     Recent Results (from the past 240 hour(s))  Urine Culture     Status: Abnormal   Collection Time: 11/25/20  8:56 AM   Specimen: Urine, Catheterized  Result Value Ref Range Status   Specimen Description   Final    URINE, CATHETERIZED Performed at [facility], [address] [place], [place]    Special Requests   Final    NONE Performed at [facility], [address] [place], [place]    Culture (A)  Final    >=100,000 COLONIES/mL ENTEROBACTER AEROGENES >=100,000 COLONIES/mL AEROCOCCUS URINAE    Report Status 11/28/2020 FINAL  Final   Organism ID, Bacteria ENTEROBACTER AEROGENES (A)  Final      Susceptibility   Enterobacter aerogenes - MIC*    CEFAZOLIN RESISTANT Resistant     CEFEPIME <=0.12 SENSITIVE Sensitive     CEFTRIAXONE <=0.25 SENSITIVE Sensitive     CIPROFLOXACIN <=0.25 SENSITIVE Sensitive     GENTAMICIN <=1 SENSITIVE Sensitive     IMIPENEM <=0.25 SENSITIVE Sensitive     NITROFURANTOIN <=16 SENSITIVE Sensitive     TRIMETH/SULFA <=20 SENSITIVE Sensitive     PIP/TAZO <=4  SENSITIVE Sensitive     * >=100,000 COLONIES/mL ENTEROBACTER AEROGENES  SARS CORONAVIRUS 2 (TAT 6-24 HRS) Nasopharyngeal Nasopharyngeal Swab     Status: None   Collection Time: 11/25/20  4:53 PM   Specimen: Nasopharyngeal Swab  Result Value Ref Range Status   SARS Coronavirus 2 NEGATIVE NEGATIVE Final    Comment: (NOTE) SARS-CoV-2 target nucleic acids are NOT DETECTED.  The SARS-CoV-2 RNA is generally detectable in upper and lower respiratory specimens during the acute phase of infection. Negative results do not preclude SARS-CoV-2 infection, do not rule out co-infections with other pathogens, and should not be used as the sole basis for treatment or other patient management decisions. Negative results must be combined with clinical observations, patient history, and  epidemiological information. The expected result is Negative.  Fact Sheet for Patients: HairSlick.no  Fact Sheet for Healthcare Providers: quierodirigir.com  This test is not yet approved or cleared by the [place] FDA and  has been authorized for detection and/or diagnosis of SARS-CoV-2 by FDA under an Emergency Use Authorization (EUA). This EUA will remain  in effect (meaning this test can be used) for the duration of the COVID-19 declaration under Se ction 564(b)(1) of the Act, 21 U.S.C. section 360bbb-3(b)(1), unless the authorization is terminated or revoked sooner.  Performed at [facility], [address] [place], [place] [address]   Surgical pcr screen     Status: None   Collection Time: 11/28/20  7:16 PM   Specimen: Nasal Mucosa; Nasal Swab  Result Value Ref Range Status   MRSA, PCR NEGATIVE NEGATIVE Final   Staphylococcus aureus NEGATIVE NEGATIVE Final    Comment: (NOTE) The Xpert SA Assay (FDA approved for NASAL specimens in patients 45 years of age and older), is one component of a comprehensive surveillance program. It is not  intended to diagnose infection nor to guide or monitor treatment. Performed at [facility], [address], [place], [place] [address]          Radiology Studies: DG Cervical Spine 2 or 3 views  Result Date: 11/28/2020 CLINICAL DATA:  ACDF EXAM: CERVICAL SPINE - 2-3 VIEW COMPARISON:  04/25/2020 FINDINGS: High single fluoroscopic images obtained during the performance of the procedure and is provided for interpretation only. Evaluation is limited due to small field of view. Anterior plate and screw fixation are seen, but the level cannot be specified due to field of view limitations. Please refer to operative report. FLUOROSCOPY TIME:  22 seconds IMPRESSION: 1. Intraoperative evaluation as above. Please refer to the operative report. Electronically Signed   By: Sharlet Salina M.D.   On: 11/28/2020 22:08   DG C-Arm 1-60 Min-No Report  Result Date: 11/28/2020 Fluoroscopy was utilized by the requesting physician.  No radiographic interpretation.        Scheduled Meds:  aspirin EC  81 mg Oral Daily   atorvastatin  40 mg Oral Daily   brimonidine  1 drop Right Eye BID   Chlorhexidine Gluconate Cloth  6 each Topical Daily   dorzolamide  1 drop Right Eye BID   insulin aspart  0-15 Units Subcutaneous TID WC   latanoprost  1 drop Right Eye QHS   lisinopril  10 mg Oral Daily   loratadine  10 mg Oral Daily   metoprolol tartrate  25 mg Oral BID WC   pantoprazole  40 mg Oral Daily   senna  1 tablet Oral BID   sodium chloride flush  3 mL Intravenous Q12H   tamsulosin  0.4 mg Oral BID   Continuous Infusions:  sodium chloride     sodium chloride 50 mL/hr at 11/29/20 0828   cefTRIAXone (ROCEPHIN)  IV 1 g (11/29/20 1000)     LOS: 4 days       Huey Bienenstock, MD [facility]   To contact the attending provider between 7A-7P or the covering provider during after hours 7P-7A, please log into the web site [email or web address] and access using universal [place] password for  that web site. If you do not have the password, please call the hospital operator.  11/29/2020, 2:33 PM

## 2020-11-29 NOTE — Progress Notes (Signed)
Glucose of 243, pt has eaten lunch, will recheck 60 minutes after lunch.

## 2020-11-30 ENCOUNTER — Encounter (HOSPITAL_COMMUNITY): Payer: Self-pay | Admitting: Neurological Surgery

## 2020-11-30 DIAGNOSIS — M6281 Muscle weakness (generalized): Secondary | ICD-10-CM

## 2020-11-30 LAB — CBC
HCT: 39.6 % (ref 39.0–52.0)
Hemoglobin: 12.8 g/dL — ABNORMAL LOW (ref 13.0–17.0)
MCH: 27 pg (ref 26.0–34.0)
MCHC: 32.3 g/dL (ref 30.0–36.0)
MCV: 83.5 fL (ref 80.0–100.0)
Platelets: 137 10*3/uL — ABNORMAL LOW (ref 150–400)
RBC: 4.74 MIL/uL (ref 4.22–5.81)
RDW: 15.1 % (ref 11.5–15.5)
WBC: 7.5 10*3/uL (ref 4.0–10.5)
nRBC: 0 % (ref 0.0–0.2)

## 2020-11-30 LAB — BASIC METABOLIC PANEL
Anion gap: 7 (ref 5–15)
BUN: 17 mg/dL (ref 8–23)
CO2: 26 mmol/L (ref 22–32)
Calcium: 8.8 mg/dL — ABNORMAL LOW (ref 8.9–10.3)
Chloride: 105 mmol/L (ref 98–111)
Creatinine, Ser: 0.79 mg/dL (ref 0.61–1.24)
GFR, Estimated: >60 mL/min (ref >60)
Glucose, Bld: 190 mg/dL — ABNORMAL HIGH (ref 70–99)
Potassium: 4.1 mmol/L (ref 3.5–5.1)
Sodium: 138 mmol/L (ref 135–145)

## 2020-11-30 LAB — GLUCOSE, CAPILLARY
Glucose-Capillary: 150 mg/dL — ABNORMAL HIGH (ref 70–99)
Glucose-Capillary: 168 mg/dL — ABNORMAL HIGH (ref 70–99)
Glucose-Capillary: 163 mg/dL — ABNORMAL HIGH (ref 70–99)
Glucose-Capillary: 140 mg/dL — ABNORMAL HIGH (ref 70–99)

## 2020-11-30 LAB — SARS CORONAVIRUS 2 (TAT 6-24 HRS): SARS Coronavirus 2: NEGATIVE

## 2020-11-30 NOTE — Consult Note (Signed)
H&P Physician requesting consult: Michael Jensen  Chief Complaint: Urinary retention, difficult Foley  History of Present Illness: 85 year old male with a history of BPH presenting with 3-day history of inability to have a bowel movement and urinary retention.  He is status post ACDF on 9/12 for spinal stenosis at C 10-T1 level.  Patient underwent a voiding trial today.  He was unable to void.  Nursing attempted straight catheterization and met resistance with some bloody return.  Therefore, I was consulted.  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   unk   Ascorbic Acid (VITAMIN C) 1000 MG tablet Take 1,000 mg by mouth daily.   11/24/2020   atorvastatin (LIPITOR) 40 MG tablet Take 40 mg by mouth daily.   11/24/2020 at am   brimonidine (ALPHAGAN) 0.2 % ophthalmic solution Place 1 drop into both eyes 2 (two) times daily.  5 11/24/2020   dorzolamide (TRUSOPT) 2 % ophthalmic solution Place 1 drop into both eyes 2 (two) times daily.  5 11/24/2020   lisinopril (ZESTRIL) 10 MG tablet Take 10 mg by mouth daily.   11/24/2020 at am   metFORMIN (GLUCOPHAGE) 500 MG tablet Take 500 mg by mouth 2 (two) times daily with a meal.   11/24/2020   metoprolol tartrate (LOPRESSOR) 25 MG tablet Take 25 mg by mouth 2 (two) times daily with a meal.   11/28/2020 at 1806   Multiple Vitamins-Minerals (CENTRUM SILVER) tablet Take 1 tablet by mouth daily with breakfast.   11/24/2020 at am   pantoprazole (PROTONIX) 40 MG tablet Take  40 mg by mouth daily before breakfast.   11/24/2020 at am   tamsulosin (FLOMAX) 0.4 MG CAPS capsule Take 0.4 mg by mouth in the morning and at bedtime.   11/24/2020 at pm   albuterol (VENTOLIN HFA) 108 (90 Base) MCG/ACT inhaler 2 puffs as needed (Patient not taking: No sig reported)   Not Taking   aspirin EC 81 MG tablet Take 1 tablet (81 mg total) by mouth daily. (Patient not taking: No sig reported) 90 tablet 3 Not Taking   Dextromethorphan-guaiFENesin (MUCINEX DM MAXIMUM STRENGTH) 60-1200 MG TB12 1 tablet as needed (Patient not taking: No sig reported)   Not Taking   latanoprost (XALATAN) 0.005 % ophthalmic solution  (Patient not taking: No sig reported)   Not Taking   loratadine (CLARITIN) 10 MG tablet 1 tablet (Patient not taking: Reported on 11/25/2020)   Not Taking   Allergies: No Known Allergies  History reviewed. No pertinent family history. Social History:  reports that he has quit smoking. He has never used smokeless tobacco. He reports that he does not use drugs. No history on file for alcohol use.  ROS: A complete review of systems was performed.  All systems are negative except for pertinent findings as noted. ROS   Physical Exam:  Vital signs in last 24 hours: Temp:  [97.6 F (36.4 C)-98.2 F (36.8  C)] 98.2 F (36.8 C) (09/14 0347) Pulse Rate:  [65-98] 98 (09/14 0347) Resp:  [17-19] 18 (09/14 0347) BP: (111-166)/(59-88) 166/71 (09/14 0347) SpO2:  [94 %-98 %] 95 % (09/14 0347) General:  Alert and oriented, No acute distress HEENT: Normocephalic, atraumatic Neck: No JVD or lymphadenopathy Cardiovascular: Regular rate and rhythm Lungs: Regular rate and effort Abdomen: Soft, nontender, nondistended, no abdominal masses Back: No CVA tenderness Extremities: No edema Neurologic: Grossly intact Genitourinary: Normal phallus.  Testicles descended bilaterally without masses  Laboratory Data:  Results for orders placed or performed during the hospital encounter of 11/25/20 (from  the past 24 hour(s))  Glucose, capillary     Status: Abnormal   Collection Time: 11/29/20  7:42 AM  Result Value Ref Range   Glucose-Capillary 169 (H) 70 - 99 mg/dL  Glucose, capillary     Status: Abnormal   Collection Time: 11/29/20 12:03 PM  Result Value Ref Range   Glucose-Capillary 243 (H) 70 - 99 mg/dL  Glucose, capillary     Status: Abnormal   Collection Time: 11/29/20  2:35 PM  Result Value Ref Range   Glucose-Capillary 213 (H) 70 - 99 mg/dL  Glucose, capillary     Status: Abnormal   Collection Time: 11/29/20  5:13 PM  Result Value Ref Range   Glucose-Capillary 162 (H) 70 - 99 mg/dL  Glucose, capillary     Status: Abnormal   Collection Time: 11/29/20  8:15 PM  Result Value Ref Range   Glucose-Capillary 170 (H) 70 - 99 mg/dL  CBC     Status: Abnormal   Collection Time: 11/30/20 12:54 AM  Result Value Ref Range   WBC 7.5 4.0 - 10.5 K/uL   RBC 4.74 4.22 - 5.81 MIL/uL   Hemoglobin 12.8 (L) 13.0 - 17.0 g/dL   HCT 39.6 39.0 - 52.0 %   MCV 83.5 80.0 - 100.0 fL   MCH 27.0 26.0 - 34.0 pg   MCHC 32.3 30.0 - 36.0 g/dL   RDW 15.1 11.5 - 15.5 %   Platelets 137 (L) 150 - 400 K/uL   nRBC 0.0 0.0 - 0.2 %  Basic metabolic panel     Status: Abnormal   Collection Time: 11/30/20 12:54 AM  Result Value Ref Range   Sodium 138 135 - 145 mmol/L   Potassium 4.1 3.5 - 5.1 mmol/L   Chloride 105 98 - 111 mmol/L   CO2 26 22 - 32 mmol/L   Glucose, Bld 190 (H) 70 - 99 mg/dL   BUN 17 8 - 23 mg/dL   Creatinine, Ser 0.79 0.61 - 1.24 mg/dL   Calcium 8.8 (L) 8.9 - 10.3 mg/dL   GFR, Estimated >60 >60 mL/min   Anion gap 7 5 - 15   Recent Results (from the past 240 hour(s))  Urine Culture     Status: Abnormal   Collection Time: 11/25/20  8:56 AM   Specimen: Urine, Catheterized  Result Value Ref Range Status   Specimen Description   Final    URINE, CATHETERIZED Performed at [facility], [place] [address], [place], [place] [address]    Special Requests   Final     NONE Performed at [facility], [place] [address], [place],  [address]    Culture (A)  Final    >=100,000 COLONIES/mL ENTEROBACTER AEROGENES >=100,000 COLONIES/mL AEROCOCCUS URINAE    Report Status 11/28/2020 FINAL  Final   Organism ID, Bacteria ENTEROBACTER AEROGENES (A)  Final      Susceptibility  Enterobacter aerogenes - MIC*    CEFAZOLIN RESISTANT Resistant     CEFEPIME <=0.12 SENSITIVE Sensitive     CEFTRIAXONE <=0.25 SENSITIVE Sensitive     CIPROFLOXACIN <=0.25 SENSITIVE Sensitive     GENTAMICIN <=1 SENSITIVE Sensitive     IMIPENEM <=0.25 SENSITIVE Sensitive     NITROFURANTOIN <=16 SENSITIVE Sensitive     TRIMETH/SULFA <=20 SENSITIVE Sensitive     PIP/TAZO <=4 SENSITIVE Sensitive     * >=100,000 COLONIES/mL ENTEROBACTER AEROGENES  SARS CORONAVIRUS 2 (TAT 6-24 HRS) Nasopharyngeal Nasopharyngeal Swab     Status: None   Collection Time: 11/25/20  4:53 PM   Specimen: Nasopharyngeal Swab  Result Value Ref Range Status   SARS Coronavirus 2 NEGATIVE NEGATIVE Final    Comment: (NOTE) SARS-CoV-2 target nucleic acids are NOT DETECTED.  The SARS-CoV-2 RNA is generally detectable in upper and lower respiratory specimens during the acute phase of infection. Negative results do not preclude SARS-CoV-2 infection, do not rule out co-infections with other pathogens, and should not be used as the sole basis for treatment or other patient management decisions. Negative results must be combined with clinical observations, patient history, and epidemiological information. The expected result is Negative.  Fact Sheet for Patients: SugarRoll.be  Fact Sheet for Healthcare Providers: https://www.woods-mathews.com/  This test is not yet approved or cleared by the [place] FDA and  has been authorized for detection and/or diagnosis of SARS-CoV-2 by FDA under an Emergency Use Authorization (EUA). This EUA will remain  in  effect (meaning this test can be used) for the duration of the COVID-19 declaration under Se ction 564(b)(1) of the Act, 21 U.S.C. section 360bbb-3(b)(1), unless the authorization is terminated or revoked sooner.  Performed at [facility], [place] [address], [place], [place] [address]   Surgical pcr screen     Status: None   Collection Time: 11/28/20  7:16 PM   Specimen: Nasal Mucosa; Nasal Swab  Result Value Ref Range Status   MRSA, PCR NEGATIVE NEGATIVE Final   Staphylococcus aureus NEGATIVE NEGATIVE Final    Comment: (NOTE) The Xpert SA Assay (FDA approved for NASAL specimens in patients 87 years of age and older), is one component of a comprehensive surveillance program. It is not intended to diagnose infection nor to guide or monitor treatment. Performed at [facility], [place] [address], [place], [place] [address]    Creatinine: Recent Labs    11/25/20 2263 11/26/20 0051 11/27/20 0225 11/29/20 0209 11/30/20 0054  CREATININE 0.90 0.85 0.72 0.72 0.79   Procedure: Simple Foley catheter insertion Under sterile conditions, I was able to advance an 60 French coud tip catheter into the bladder with mild resistance.  There was return of yellow urine.  Impression/Assessment:  BPH with urinary retention Acute UTI  Plan:  Continue Foley catheter.  Continue tamsulosin.  Agree with Rocephin for 7 days with transition to oral on discharge.  He will need follow-up in the clinic for voiding trial in 1 to 2 weeks.  If he fails he would likely need urodynamics and cystoscopy.  Marton Redwood, III 11/30/2020, 7:37 AM

## 2020-11-30 NOTE — Progress Notes (Signed)
Notified by RN that pt complains of pelvic pain and cannot urinate. Bladder scan shows over 500 ml in bladder. RN attempted in and out cath. Could not pass catheter. Had some blood. Pt has hx of BPH.  Discussed with Urology, Dr. Alvester Morin, who is coming to evaluate pt and place foley.

## 2020-11-30 NOTE — Progress Notes (Signed)
Occupational Therapy Treatment Patient Details Name: Michael Jensen MRN: [id number] DOB: 05/01/1935 Today's Date: 11/30/2020   History of present illness 85 y.o. male with medical history significant of hypertension, hyperlipidemia, BPH and diabetes who is presenting today with 3 days of inability to have a bowel movement and complaints of urinary retention.  Patient reports declining mobility and ambulatory status over the last 3 months.  Pt is s/p C7-T1 discectomy and fusion on 9/12.   OT comments  Patient now s/p ACDF.  Precautions reviewed, and patient verbalizes understanding.  The patient has a tendency to lean right, and OT spent a fair amount of time trying to reposition him in bed so he can successfully eat his breakfast.  Deficits are listed below.  SNF is recommended post acute for rehab as he needs 24 hour heavy assist.  OT to continue efforts in the acute setting to maximize his functional status.     Recommendations for follow up therapy are one component of a multi-disciplinary discharge planning process, led by the attending physician.  Recommendations may be updated based on patient status, additional functional criteria and insurance authorization.    Follow Up Recommendations  SNF    Equipment Recommendations  Wheelchair cushion (measurements OT);Wheelchair (measurements OT);Tub/shower bench;3 in 1 bedside commode    Recommendations for Other Services      Precautions / Restrictions Precautions Precautions: Fall;Cervical Restrictions Weight Bearing Restrictions: No       Mobility Bed Mobility     Rolling: Mod assist         General bed mobility comments: patient able to grab headboard rail to assist with positioing higher in the bed. Patient Response: Cooperative  Transfers                 General transfer comment: patient continues to lean to the R in bed, pillow placed for positioning - increase upright sitting for am meal    Balance                                            ADL either performed or assessed with clinical judgement   ADL   Eating/Feeding: Set up;Bed level Eating/Feeding Details (indicate cue type and reason): with minimal spillage noted.  Patient to be issued foam for utensils to increase independence. Grooming: Wash/dry hands;Wash/dry face;Set up;Bed level                                       Vision Baseline Vision/History: 2 Legally blind Patient Visual Report: No change from baseline                Cognition   Behavior During Therapy: WFL for tasks assessed/performed Overall Cognitive Status: No family/caregiver present to determine baseline cognitive functioning                                                            Pertinent Vitals/ Pain       Faces Pain Scale: Hurts little more Pain Location: neck Pain Descriptors / Indicators: Grimacing;Tender Pain Intervention(s): Monitored during session  Frequency  Min 2X/week        Progress Toward Goals  OT Goals(current goals can now be found in the care plan section)  Progress towards OT goals: Progressing toward goals  Acute Rehab OT Goals Patient Stated Goal: move better so I can get home OT Goal Formulation: With patient Time For Goal Achievement: 12/10/20 Potential to Achieve Goals: Good ADL Goals Pt Will Perform Eating: Independently  Plan Discharge plan remains appropriate    Co-evaluation                 AM-PAC OT "6 Clicks" Daily Activity     Outcome Measure   Help from another person eating meals?: A Little Help from another person taking care of personal grooming?: A Little Help from another person toileting, which includes using toliet, bedpan, or urinal?: Total Help from another person bathing (including washing, rinsing, drying)?: A Lot Help from another person to  put on and taking off regular upper body clothing?: A Lot Help from another person to put on and taking off regular lower body clothing?: A Lot 6 Click Score: 13    End of Session    OT Visit Diagnosis: Unsteadiness on feet (R26.81);Muscle weakness (generalized) (M62.81)   Activity Tolerance Patient tolerated treatment well   Patient Left in bed;with call bell/phone within reach   Nurse Communication          Time: 6967-8938 OT Time Calculation (min): 18 min  Charges: OT General Charges $OT Visit: 1 Visit OT Treatments $Self Care/Home Management : 8-22 mins  11/30/2020  RP, OTR/L  Acute Rehabilitation Services  Office:  [phone]   Suzanna Obey 11/30/2020, 8:59 AM

## 2020-11-30 NOTE — TOC Progression Note (Addendum)
Transition of Care [facility]) - Progression Note    Patient Details  Name: Taiden Raybourn MRN: [id number] Date of Birth: 09-14-1935  Transition of Care [facility]) CM/SW Contact  Mearl Latin, LCSW Phone Number: 11/30/2020, 11:02 AM  Clinical Narrative:    [facility] able to accept patient. CSW requested facility start insurance authorization (not managed by [facility]). CSW updated patient's daughter.    Expected Discharge Plan: Skilled Nursing Facility Barriers to Discharge: English as a second language teacher, SNF Pending bed offer, Continued Medical Work up  Expected Discharge Plan and Services Expected Discharge Plan: Skilled Nursing Facility In-house Referral: Clinical Social Work   Post Acute Care Choice: Skilled Nursing Facility Living arrangements for the past 2 months: Single Family Home                                       Social Determinants of Health (SDOH) Interventions    Readmission Risk Interventions No flowsheet data found.

## 2020-11-30 NOTE — Progress Notes (Signed)
PROGRESS NOTE    Michael Jensen  YTK:[id number] DOB: 09-Apr-1935 DOA: 11/25/2020 PCP: Soundra Pilon, FNP     Chief Complaint  Patient presents with   urine retention   Flank Pain   Constipation    Brief Narrative:  Michael Jensen is a 85 y.o. male with medical history significant of hypertension, hyperlipidemia, BPH and diabetes who presented with progressive bilateral lower extremity weakness-imaging showed severe spinal stenosis at C7-T1-underwent C7-T1 ACDF.   Subjective: Thinks he has had mild improvement in his lower extremity weakness postsurgery.  Claims he had a rough night  Objective: Vitals: Blood pressure 138/65, pulse 75, temperature 98.4 F (36.9 C), temperature source Oral, resp. rate 18, height 5\' 9"  (1.753 m), weight 89.5 kg, SpO2 100 %.   Exam: Gen Exam:Alert awake-not in any distress HEENT:atraumatic, normocephalic Chest: B/L clear to auscultation anteriorly CVS:S1S2 regular Abdomen:soft non tender, non distended Extremities:no edema Neurology:-3+/5 lower extremity weakness Skin: no rash   Pertinent labs/radiology: Na: 138 Creatinine: 0.79 Hb: 12.8  Assessment & Plan: Bilateral lower extremity weakness (right> left) due to severe spinal stenosis: S/p C7-T1 ACDF on 9/12-patient reports slow improvement in lower extremity weakness then prior to this hospitalization.  Plans are for SNF on discharge.  Acute urinary retention: Failed voiding trial on 9/13-continue Flomax-will require voiding trial at urology clinic in 1-2 weeks.  Complicated Enterobacter/Aerococcus UTI: Continue with Rocephin-transition to antimicrobial therapy prior to discharge.  Plan for 7 days total.  HLD: Continue statin  HTN: BP stable-continue lisinopril, metoprolol  DM-2 (A1c 6.3 on 9/9): CBGs stable.  Continue SSI  Recent Labs    11/29/20 2015 11/30/20 0818 11/30/20 1205  GLUCAP 170* 150* 168*    Legally blind: Likely due to underlying diabetic retinopathy  DVT  prophylaxis: SCD, SQ. heparin DVT prophylaxis currently  on hold, will resume once cleared by neurosurgery Code Status: Full Family Communication: Discussed with daughter by phone. Disposition:   Status is: Inpatient  Remains inpatient appropriate because:IV treatments appropriate due to intensity of illness or inability to take PO  Dispo: The patient is from: Home              Anticipated d/c is to: SNF              Patient currently is not medically stable to d/c.   Difficult to place patient No       Consultants:  Neurosurgery  Data Reviewed: I have personally reviewed following labs and imaging studies  CBC: Recent Labs  Lab 11/25/20 0856 11/27/20 0225 11/29/20 0209 11/30/20 0054  WBC 7.0 6.6 5.8 7.5  NEUTROABS 5.6  --   --   --   HGB 13.7 12.5* 12.5* 12.8*  HCT 41.7 38.3* 39.4 39.6  MCV 82.9 83.6 83.8 83.5  PLT 130* 125* 138* 137*     Basic Metabolic Panel: Recent Labs  Lab 11/25/20 0856 11/26/20 0051 11/27/20 0225 11/29/20 0209 11/30/20 0054  NA 137 139 138 136 138  K 3.9 3.9 4.1 4.3 4.1  CL 104 106 107 102 105  CO2 24 24 23 28 26   GLUCOSE 166* 139* 158* 178* 190*  BUN 21 15 17 13 17   CREATININE 0.90 0.85 0.72 0.72 0.79  CALCIUM 9.2 9.0 9.1 9.1 8.8*     GFR: Estimated Creatinine Clearance: 77.4 mL/min (by C-G formula based on SCr of 0.79 mg/dL).  Liver Function Tests: No results for input(s): AST, ALT, ALKPHOS, BILITOT, PROT, ALBUMIN in the last 168 hours.  CBG: Recent Labs  Lab 11/29/20 1435 11/29/20 1713 11/29/20 2015 11/30/20 0818 11/30/20 1205  GLUCAP 213* 162* 170* 150* 168*      Recent Results (from the past 240 hour(s))  Urine Culture     Status: Abnormal   Collection Time: 11/25/20  8:56 AM   Specimen: Urine, Catheterized  Result Value Ref Range Status   Specimen Description   Final    URINE, CATHETERIZED Performed at [facility], [address], [place], [place] [address]    Special Requests    Final    NONE Performed at [facility], [address], [place], [place] [address]    Culture (A)  Final    >=100,000 COLONIES/mL ENTEROBACTER AEROGENES >=100,000 COLONIES/mL AEROCOCCUS URINAE    Report Status 11/28/2020 FINAL  Final   Organism ID, Bacteria ENTEROBACTER AEROGENES (A)  Final      Susceptibility   Enterobacter aerogenes - MIC*    CEFAZOLIN RESISTANT Resistant     CEFEPIME <=0.12 SENSITIVE Sensitive     CEFTRIAXONE <=0.25 SENSITIVE Sensitive     CIPROFLOXACIN <=0.25 SENSITIVE Sensitive     GENTAMICIN <=1 SENSITIVE Sensitive     IMIPENEM <=0.25 SENSITIVE Sensitive     NITROFURANTOIN <=16 SENSITIVE Sensitive     TRIMETH/SULFA <=20 SENSITIVE Sensitive     PIP/TAZO <=4 SENSITIVE Sensitive     * >=100,000 COLONIES/mL ENTEROBACTER AEROGENES  SARS CORONAVIRUS 2 (TAT 6-24 HRS) Nasopharyngeal Nasopharyngeal Swab     Status: None   Collection Time: 11/25/20  4:53 PM   Specimen: Nasopharyngeal Swab  Result Value Ref Range Status   SARS Coronavirus 2 NEGATIVE NEGATIVE Final    Comment: (NOTE) SARS-CoV-2 target nucleic acids are NOT DETECTED.  The SARS-CoV-2 RNA is generally detectable in upper and lower respiratory specimens during the acute phase of infection. Negative results do not preclude SARS-CoV-2 infection, do not rule out co-infections with other pathogens, and should not be used as the sole basis for treatment or other patient management decisions. Negative results must be combined with clinical observations, patient history, and epidemiological information. The expected result is Negative.  Fact Sheet for Patients: HairSlick.no  Fact Sheet for Healthcare Providers: quierodirigir.com  This test is not yet approved or cleared by the [place] FDA and  has been authorized for detection and/or diagnosis of SARS-CoV-2 by FDA under an Emergency Use Authorization (EUA). This EUA will  remain  in effect (meaning this test can be used) for the duration of the COVID-19 declaration under Se ction 564(b)(1) of the Act, 21 U.S.C. section 360bbb-3(b)(1), unless the authorization is terminated or revoked sooner.  Performed at [facility], [address] [place], [place] [address]   Surgical pcr screen     Status: None   Collection Time: 11/28/20  7:16 PM   Specimen: Nasal Mucosa; Nasal Swab  Result Value Ref Range Status   MRSA, PCR NEGATIVE NEGATIVE Final   Staphylococcus aureus NEGATIVE NEGATIVE Final    Comment: (NOTE) The Xpert SA Assay (FDA approved for NASAL specimens in patients 40 years of age and older), is one component of a comprehensive surveillance program. It is not intended to diagnose infection nor to guide or monitor treatment. Performed at [facility], [address], [place], [place] [address]   SARS CORONAVIRUS 2 (TAT 6-24 HRS) Nasopharyngeal Nasopharyngeal Swab     Status: None   Collection Time: 11/30/20 10:50 AM   Specimen: Nasopharyngeal Swab  Result Value Ref Range Status   SARS  Coronavirus 2 NEGATIVE NEGATIVE Final    Comment: (NOTE) SARS-CoV-2 target nucleic acids are NOT DETECTED.  The SARS-CoV-2 RNA is generally detectable in upper and lower respiratory specimens during the acute phase of infection. Negative results do not preclude SARS-CoV-2 infection, do not rule out co-infections with other pathogens, and should not be used as the sole basis for treatment or other patient management decisions. Negative results must be combined with clinical observations, patient history, and epidemiological information. The expected result is Negative.  Fact Sheet for Patients: HairSlick.no  Fact Sheet for Healthcare Providers: quierodirigir.com  This test is not yet approved or cleared by the [place] FDA and  has been authorized for detection and/or diagnosis of  SARS-CoV-2 by FDA under an Emergency Use Authorization (EUA). This EUA will remain  in effect (meaning this test can be used) for the duration of the COVID-19 declaration under Se ction 564(b)(1) of the Act, 21 U.S.C. section 360bbb-3(b)(1), unless the authorization is terminated or revoked sooner.  Performed at [facility], [address] [place], [place] [address]           Radiology Studies: DG Cervical Spine 2 or 3 views  Result Date: 11/28/2020 CLINICAL DATA:  ACDF EXAM: CERVICAL SPINE - 2-3 VIEW COMPARISON:  04/25/2020 FINDINGS: High single fluoroscopic images obtained during the performance of the procedure and is provided for interpretation only. Evaluation is limited due to small field of view. Anterior plate and screw fixation are seen, but the level cannot be specified due to field of view limitations. Please refer to operative report. FLUOROSCOPY TIME:  22 seconds IMPRESSION: 1. Intraoperative evaluation as above. Please refer to the operative report. Electronically Signed   By: Sharlet Salina M.D.   On: 11/28/2020 22:08   DG C-Arm 1-60 Min-No Report  Result Date: 11/28/2020 Fluoroscopy was utilized by the requesting physician.  No radiographic interpretation.        Scheduled Meds:  aspirin EC  81 mg Oral Daily   atorvastatin  40 mg Oral Daily   brimonidine  1 drop Right Eye BID   Chlorhexidine Gluconate Cloth  6 each Topical Daily   dorzolamide  1 drop Right Eye BID   insulin aspart  0-15 Units Subcutaneous TID WC   latanoprost  1 drop Right Eye QHS   lisinopril  10 mg Oral Daily   loratadine  10 mg Oral Daily   metoprolol tartrate  25 mg Oral BID WC   pantoprazole  40 mg Oral Daily   senna  1 tablet Oral BID   sodium chloride flush  3 mL Intravenous Q12H   tamsulosin  0.4 mg Oral BID   Continuous Infusions:  sodium chloride     sodium chloride 50 mL/hr at 11/29/20 2136   cefTRIAXone (ROCEPHIN)  IV 1 g (11/30/20 0947)     LOS: 5 days        Jeoffrey Massed, MD [facility]   To contact the attending provider between 7A-7P or the covering provider during after hours 7P-7A, please log into the web site [email or web address] and access using universal [place] password for that web site. If you do not have the password, please call the hospital operator.  11/30/2020, 4:03 PM

## 2020-11-30 NOTE — Progress Notes (Signed)
Foley removed per order for voiding trial. Patient unable to void and complaining of severe pelvic pressure and intermittent pain. On call Hospitalist paged regarding patient's status. Order placed for In and Out cath but was unsuccessful. Attempt was made by myself and charge nurse Matt.  On call provider paged again for additional orders.

## 2020-11-30 NOTE — Progress Notes (Signed)
Neurosurgery Service Progress Note  Subjective: No acute events overnight, had to have a foley reinserted for retention, otherwise doing well   Objective: Vitals:   11/29/20 2016 11/30/20 0002 11/30/20 0347 11/30/20 0759  BP: 113/61 (!) 152/88 (!) 166/71 (!) 144/73  Pulse: 65 85 98 73  Resp: 19 19 18 18   Temp: 97.8 F (36.6 C) 97.8 F (36.6 C) 98.2 F (36.8 C) 98.5 F (36.9 C)  TempSrc: Oral Oral Oral Oral  SpO2: 94% 95% 95% 97%  Weight:      Height:        Physical Exam: Strength 4+ to 5/5 in BUE BLE with increased tone bilaterally, RLE is 4-/5 diffusely, LLE is 4-/5 diffusely, reflexes 1+ in BUE, 3+ at the right patella, 1+ at the left patella Incision c/d/I, neck soft  Assessment & Plan: 85 y.o. man w/ progressive BLE weakness, R>L. 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 9/12 s/p C7-T1 ACDF  -activity as tolerated, no brace needed -diet as tolerated -BLE strength improving, reassuring sign   11/12  11/30/20 12:11 PM

## 2020-12-01 DIAGNOSIS — M4800 Spinal stenosis, site unspecified: Secondary | ICD-10-CM | POA: Diagnosis not present

## 2020-12-01 DIAGNOSIS — I82401 Acute embolism and thrombosis of unspecified deep veins of right lower extremity: Secondary | ICD-10-CM | POA: Diagnosis not present

## 2020-12-01 DIAGNOSIS — I10 Essential (primary) hypertension: Secondary | ICD-10-CM | POA: Diagnosis not present

## 2020-12-01 DIAGNOSIS — R791 Abnormal coagulation profile: Secondary | ICD-10-CM | POA: Diagnosis not present

## 2020-12-01 DIAGNOSIS — N400 Enlarged prostate without lower urinary tract symptoms: Secondary | ICD-10-CM | POA: Diagnosis not present

## 2020-12-01 DIAGNOSIS — R339 Retention of urine, unspecified: Secondary | ICD-10-CM | POA: Diagnosis not present

## 2020-12-01 DIAGNOSIS — E119 Type 2 diabetes mellitus without complications: Secondary | ICD-10-CM | POA: Diagnosis not present

## 2020-12-01 DIAGNOSIS — H548 Legal blindness, as defined in USA: Secondary | ICD-10-CM | POA: Diagnosis not present

## 2020-12-01 DIAGNOSIS — Z794 Long term (current) use of insulin: Secondary | ICD-10-CM | POA: Diagnosis not present

## 2020-12-01 DIAGNOSIS — R4182 Altered mental status, unspecified: Secondary | ICD-10-CM | POA: Diagnosis not present

## 2020-12-01 DIAGNOSIS — E782 Mixed hyperlipidemia: Secondary | ICD-10-CM | POA: Diagnosis not present

## 2020-12-01 DIAGNOSIS — E1140 Type 2 diabetes mellitus with diabetic neuropathy, unspecified: Secondary | ICD-10-CM | POA: Diagnosis not present

## 2020-12-01 DIAGNOSIS — E1165 Type 2 diabetes mellitus with hyperglycemia: Secondary | ICD-10-CM | POA: Diagnosis not present

## 2020-12-01 DIAGNOSIS — R5383 Other fatigue: Secondary | ICD-10-CM | POA: Diagnosis not present

## 2020-12-01 DIAGNOSIS — M79671 Pain in right foot: Secondary | ICD-10-CM | POA: Diagnosis not present

## 2020-12-01 DIAGNOSIS — R52 Pain, unspecified: Secondary | ICD-10-CM | POA: Diagnosis not present

## 2020-12-01 DIAGNOSIS — M2550 Pain in unspecified joint: Secondary | ICD-10-CM | POA: Diagnosis not present

## 2020-12-01 DIAGNOSIS — N390 Urinary tract infection, site not specified: Secondary | ICD-10-CM | POA: Diagnosis not present

## 2020-12-01 DIAGNOSIS — Z87891 Personal history of nicotine dependence: Secondary | ICD-10-CM | POA: Diagnosis not present

## 2020-12-01 DIAGNOSIS — Z0389 Encounter for observation for other suspected diseases and conditions ruled out: Secondary | ICD-10-CM | POA: Diagnosis not present

## 2020-12-01 DIAGNOSIS — F22 Delusional disorders: Secondary | ICD-10-CM | POA: Diagnosis not present

## 2020-12-01 DIAGNOSIS — E1142 Type 2 diabetes mellitus with diabetic polyneuropathy: Secondary | ICD-10-CM | POA: Diagnosis not present

## 2020-12-01 DIAGNOSIS — R0902 Hypoxemia: Secondary | ICD-10-CM | POA: Diagnosis not present

## 2020-12-01 DIAGNOSIS — M4714 Other spondylosis with myelopathy, thoracic region: Secondary | ICD-10-CM | POA: Diagnosis not present

## 2020-12-01 DIAGNOSIS — Z7982 Long term (current) use of aspirin: Secondary | ICD-10-CM | POA: Diagnosis not present

## 2020-12-01 DIAGNOSIS — G629 Polyneuropathy, unspecified: Secondary | ICD-10-CM | POA: Diagnosis not present

## 2020-12-01 DIAGNOSIS — Z7401 Bed confinement status: Secondary | ICD-10-CM | POA: Diagnosis not present

## 2020-12-01 DIAGNOSIS — E785 Hyperlipidemia, unspecified: Secondary | ICD-10-CM | POA: Diagnosis not present

## 2020-12-01 DIAGNOSIS — R296 Repeated falls: Secondary | ICD-10-CM | POA: Diagnosis not present

## 2020-12-01 DIAGNOSIS — Z79899 Other long term (current) drug therapy: Secondary | ICD-10-CM | POA: Diagnosis not present

## 2020-12-01 DIAGNOSIS — L89614 Pressure ulcer of right heel, stage 4: Secondary | ICD-10-CM | POA: Diagnosis not present

## 2020-12-01 DIAGNOSIS — M79674 Pain in right toe(s): Secondary | ICD-10-CM | POA: Diagnosis not present

## 2020-12-01 DIAGNOSIS — L89323 Pressure ulcer of left buttock, stage 3: Secondary | ICD-10-CM | POA: Diagnosis not present

## 2020-12-01 DIAGNOSIS — K219 Gastro-esophageal reflux disease without esophagitis: Secondary | ICD-10-CM | POA: Diagnosis not present

## 2020-12-01 DIAGNOSIS — R000 Tachycardia, unspecified: Secondary | ICD-10-CM | POA: Diagnosis not present

## 2020-12-01 DIAGNOSIS — Z20822 Contact with and (suspected) exposure to covid-19: Secondary | ICD-10-CM | POA: Diagnosis not present

## 2020-12-01 DIAGNOSIS — K5900 Constipation, unspecified: Secondary | ICD-10-CM | POA: Diagnosis not present

## 2020-12-01 DIAGNOSIS — B351 Tinea unguium: Secondary | ICD-10-CM | POA: Diagnosis not present

## 2020-12-01 DIAGNOSIS — Z7984 Long term (current) use of oral hypoglycemic drugs: Secondary | ICD-10-CM | POA: Diagnosis not present

## 2020-12-01 DIAGNOSIS — R4189 Other symptoms and signs involving cognitive functions and awareness: Secondary | ICD-10-CM | POA: Diagnosis not present

## 2020-12-01 DIAGNOSIS — R252 Cramp and spasm: Secondary | ICD-10-CM | POA: Diagnosis not present

## 2020-12-01 DIAGNOSIS — L89620 Pressure ulcer of left heel, unstageable: Secondary | ICD-10-CM | POA: Diagnosis not present

## 2020-12-01 DIAGNOSIS — R338 Other retention of urine: Secondary | ICD-10-CM | POA: Diagnosis not present

## 2020-12-01 DIAGNOSIS — R2681 Unsteadiness on feet: Secondary | ICD-10-CM | POA: Diagnosis not present

## 2020-12-01 DIAGNOSIS — N401 Enlarged prostate with lower urinary tract symptoms: Secondary | ICD-10-CM | POA: Diagnosis not present

## 2020-12-01 DIAGNOSIS — M2141 Flat foot [pes planus] (acquired), right foot: Secondary | ICD-10-CM | POA: Diagnosis not present

## 2020-12-01 DIAGNOSIS — R001 Bradycardia, unspecified: Secondary | ICD-10-CM | POA: Diagnosis not present

## 2020-12-01 DIAGNOSIS — Z978 Presence of other specified devices: Secondary | ICD-10-CM | POA: Diagnosis not present

## 2020-12-01 DIAGNOSIS — M170 Bilateral primary osteoarthritis of knee: Secondary | ICD-10-CM | POA: Diagnosis not present

## 2020-12-01 DIAGNOSIS — R410 Disorientation, unspecified: Secondary | ICD-10-CM | POA: Diagnosis not present

## 2020-12-01 DIAGNOSIS — M2142 Flat foot [pes planus] (acquired), left foot: Secondary | ICD-10-CM | POA: Diagnosis not present

## 2020-12-01 DIAGNOSIS — N3001 Acute cystitis with hematuria: Secondary | ICD-10-CM | POA: Diagnosis not present

## 2020-12-01 DIAGNOSIS — L89619 Pressure ulcer of right heel, unspecified stage: Secondary | ICD-10-CM | POA: Diagnosis not present

## 2020-12-01 DIAGNOSIS — M4803 Spinal stenosis, cervicothoracic region: Secondary | ICD-10-CM | POA: Diagnosis not present

## 2020-12-01 DIAGNOSIS — M6281 Muscle weakness (generalized): Secondary | ICD-10-CM | POA: Diagnosis not present

## 2020-12-01 DIAGNOSIS — M79675 Pain in left toe(s): Secondary | ICD-10-CM | POA: Diagnosis not present

## 2020-12-01 DIAGNOSIS — M48061 Spinal stenosis, lumbar region without neurogenic claudication: Secondary | ICD-10-CM | POA: Diagnosis not present

## 2020-12-01 DIAGNOSIS — H409 Unspecified glaucoma: Secondary | ICD-10-CM | POA: Diagnosis not present

## 2020-12-01 LAB — GLUCOSE, CAPILLARY
Glucose-Capillary: 135 mg/dL — ABNORMAL HIGH (ref 70–99)
Glucose-Capillary: 246 mg/dL — ABNORMAL HIGH (ref 70–99)
Glucose-Capillary: 200 mg/dL — ABNORMAL HIGH (ref 70–99)

## 2020-12-01 MED ORDER — TAMSULOSIN HCL 0.4 MG PO CAPS: 0.4000 mg | ORAL_CAPSULE | Freq: Two times a day (BID) | ORAL | Status: AC

## 2020-12-01 MED ORDER — LATANOPROST 0.005 % OP SOLN: 1.0000 [drp] | Freq: Every day | OPHTHALMIC | 12 refills | Status: AC

## 2020-12-01 MED ORDER — INSULIN ASPART 100 UNIT/ML IJ SOLN: INTRAMUSCULAR | 11 refills | Status: AC

## 2020-12-01 NOTE — TOC Progression Note (Addendum)
Transition of Care [facility]) - Progression Note    Patient Details  Name: Michael Jensen MRN: [id number] Date of Birth: 1935/09/28  [facility] CM/SW Contact  Mearl Latin, LCSW Phone Number: 12/01/2020, 9:17 AM  Clinical Narrative:    [facility] has received insurance approval for patient. Patient's daughter in agreement to go there at 2pm on her lunch break to sign paperwork. MSW Intern updated patient at bedside.    Expected Discharge Plan: Skilled Nursing Facility Barriers to Discharge: Barriers Resolved  Expected Discharge Plan and Services Expected Discharge Plan: Skilled Nursing Facility In-house Referral: Clinical Social Work   Post Acute Care Choice: Skilled Nursing Facility Living arrangements for the past 2 months: Single Family Home                                       Social Determinants of Health (SDOH) Interventions    Readmission Risk Interventions No flowsheet data found.

## 2020-12-01 NOTE — Progress Notes (Signed)
Physical Therapy Treatment Patient Details Name: Michael Jensen MRN: [id number] DOB: 1936-02-05 Today's Date: 12/01/2020   History of Present Illness 85 y.o. male with medical history significant of hypertension, hyperlipidemia, BPH and diabetes who is presenting today with 3 days of inability to have a bowel movement and complaints of urinary retention.  Patient reports declining mobility and ambulatory status over the last 3 months.  Pt is s/p C7-T1 discectomy and fusion on 9/12.    PT Comments    Patient eager to participate. LE weakness continues to be limiting factor and was unable to even clear hips off mattress with attempt to stand from EOB. Returned to supine and positioned for comfort.     Recommendations for follow up therapy are one component of a multi-disciplinary discharge planning process, led by the attending physician.  Recommendations may be updated based on patient status, additional functional criteria and insurance authorization.  Follow Up Recommendations  SNF     Equipment Recommendations  Other (comment) (stair lift or putting bed on 1st level for safety; hoyer lift)    Recommendations for Other Services       Precautions / Restrictions Precautions Precautions: Fall;Cervical Precaution Comments: Per notes, no brace required Restrictions Other Position/Activity Restrictions: lower extremity weakness     Mobility  Bed Mobility Overal bed mobility: Needs Assistance Bed Mobility: Rolling;Sidelying to Sit;Sit to Supine Rolling: Mod assist Sidelying to sit: Mod assist   Sit to supine: +2 for physical assistance;Max assist   General bed mobility comments: pt rolled rt, assist to raise torso and move legs off EOB; assist sit to supine due to pt position after lateral scooting and safest option but required incr assist    Transfers Overall transfer level: Needs assistance Equipment used: 2 person hand held assist Transfers: Sit to/from Stand;Lateral/Scoot  Transfers Sit to Stand: +2 physical assistance        Lateral/Scoot Transfers: Max assist;+2 physical assistance General transfer comment: attempted sit to stand with +2 max assist with no clearance of hips off elevated bed; lateral scoot x 3 towards HOB with good use of UEs to assist  Ambulation/Gait                 Stairs             Wheelchair Mobility    Modified Rankin (Stroke Patients Only)       Balance Overall balance assessment: Needs assistance Sitting-balance support: Feet supported;Bilateral upper extremity supported Sitting balance-Leahy Scale: Poor Sitting balance - Comments: reliant on UE support of bed and min to min guard for balance due to posterior lean Postural control: Posterior lean                                  Cognition Arousal/Alertness: Awake/alert Behavior During Therapy: WFL for tasks assessed/performed Overall Cognitive Status: No family/caregiver present to determine baseline cognitive functioning Area of Impairment: Memory                                      Exercises      General Comments        Pertinent Vitals/Pain Pain Assessment: Faces Faces Pain Scale: Hurts little more Pain Location: neck and shoulders Pain Descriptors / Indicators: Grimacing;Tender Pain Intervention(s): Limited activity within patient's tolerance;RN gave pain meds during session    Home Living  Prior Function            PT Goals (current goals can now be found in the care plan section) Acute Rehab PT Goals Patient Stated Goal: move better so I can get home Time For Goal Achievement: 12/11/20 Potential to Achieve Goals: Fair Progress towards PT goals: Progressing toward goals    Frequency    Min 3X/week      PT Plan Current plan remains appropriate    Co-evaluation              AM-PAC PT "6 Clicks" Mobility   Outcome Measure  Help needed turning from your  back to your side while in a flat bed without using bedrails?: A Lot Help needed moving from lying on your back to sitting on the side of a flat bed without using bedrails?: A Lot Help needed moving to and from a bed to a chair (including a wheelchair)?: Total Help needed standing up from a chair using your arms (e.g., wheelchair or bedside chair)?: Total Help needed to walk in hospital room?: Total Help needed climbing 3-5 steps with a railing? : Total 6 Click Score: 8    End of Session Equipment Utilized During Treatment: Gait belt Activity Tolerance: Patient tolerated treatment well Patient left: in bed;with call bell/phone within reach;with bed alarm set   PT Visit Diagnosis: Other abnormalities of gait and mobility (R26.89);Muscle weakness (generalized) (M62.81);Other symptoms and signs involving the nervous system (R29.898)     Time: 6433-2951 PT Time Calculation (min) (ACUTE ONLY): 14 min  Charges:  $Therapeutic Activity: 8-22 mins                      Jerolyn Center, PT Pager [phone]    Zena Amos 12/01/2020, 3:19 PM

## 2020-12-01 NOTE — Progress Notes (Signed)
Neurosurgery Service Progress Note  Subjective: No acute events overnight, today states he's noticing his legs starting to get much stronger  Objective: Vitals:   11/30/20 1940 11/30/20 2340 12/01/20 0402 12/01/20 0730  BP: 122/63 (!) 145/89 120/80 (!) 142/80  Pulse: 80 75 75 67  Resp: 17 16 18 15   Temp: 98 F (36.7 C) 98.1 F (36.7 C) 98.1 F (36.7 C) 98.2 F (36.8 C)  TempSrc: Oral Oral Oral Oral  SpO2: 95% 95% 94% 98%  Weight:      Height:        Physical Exam: Strength 4+ to 5/5 in BUE BLE with increased tone bilaterally, RLE is 4-/5 diffusely, LLE is 4-/5 diffusely, reflexes 1+ in BUE, 3+ at the right patella, 1+ at the left patella Incision c/d/I, neck soft  Assessment & Plan: 85 y.o. man w/ progressive BLE weakness, R>L. 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 9/12 s/p C7-T1 ACDF  -doing well, no change in plan   11/12  12/01/20 9:17 AM

## 2020-12-01 NOTE — Discharge Summary (Signed)
PATIENT DETAILS Name: Michael Jensen Age: 85 y.o. Sex: male Date of Birth: 1936-02-19 MRN: [id number]. Admitting Physician: Jacques Navy, MD FAO:ZHYQM, Resa Miner, FNP  Admit Date: 11/25/2020 Discharge date: 12/01/2020  Recommendations for Outpatient Follow-up:  Follow up with PCP in 1-2 weeks Please obtain CMP/CBC in one week Please ensure follow-up with neurosurgery Being discharged with Foley catheter-please ensure outpatient follow-up with urology for voiding trial in the next 1-2 weeks.  Admitted From:  Home  Disposition: SNF   Home Health: No  Equipment/Devices: None  Discharge Condition: Stable  CODE STATUS: FULL CODE  Diet recommendation:  Diet Order             Diet - low sodium heart healthy           Diet regular Room service appropriate? Yes; Fluid consistency: Thin  Diet effective now                    Brief Summary: Michael Jensen is a 85 y.o. male with medical history significant of hypertension, hyperlipidemia, BPH and diabetes who presented with progressive bilateral lower extremity weakness-imaging showed severe spinal stenosis at C7-T1-underwent C7-T1 ACDF.  Brief Hospital Course: Bilateral lower extremity weakness (right> left) due to severe spinal stenosis: S/p C7-T1 ACDF on 9/12-patient reports slow improvement in lower extremity weakness then prior to this hospitalization.  Plans are for SNF on discharge.  Please ensure follow-up with neurosurgery-Dr. Johnsie Cancel on discharge.   Acute urinary retention: Failed voiding trial on 9/13-continue Flomax-will require voiding trial at urology clinic in 1-2 weeks.   Complicated Enterobacter/Aerococcus UTI: Completed a total of 7 days of IV Rocephin in the hospital.  No further antimicrobial therapy planned.   HLD: Continue statin   HTN: BP stable-continue lisinopril, metoprolol   DM-2 (A1c 6.3 on 9/9): CBGs stable.  Continue SSI-resume metformin on discharge.  Legally blind: Likely due  to underlying diabetic retinopathy     Procedures C7-T1 ACDF on 9/12  Discharge Diagnoses:  Active Problems:   Benign prostatic hyperplasia without lower urinary tract symptoms   Diabetic peripheral neuropathy associated with type 2 diabetes mellitus (HCC)   Essential hypertension   Gastroesophageal reflux disease without esophagitis   Acute lower UTI   Acute urinary retention   Spinal stenosis of lumbar region at multiple levels   Spinal stenosis, cervicothoracic region   Discharge Instructions:  Activity:  As tolerated with Full fall precautions use walker/cane & assistance as needed   Discharge Instructions     Call MD for:  redness, tenderness, or signs of infection (pain, swelling, redness, odor or green/yellow discharge around incision site)   Complete by: As directed    Diet - low sodium heart healthy   Complete by: As directed    Discharge instructions   Complete by: As directed    Follow with Primary MD  Soundra Pilon, FNP in 1-2 weeks  Please get a complete blood count and chemistry panel checked by your Primary MD at your next visit, and again as instructed by your Primary MD.  Get Medicines reviewed and adjusted: Please take all your medications with you for your next visit with your Primary MD  Laboratory/radiological data: Please request your Primary MD to go over all hospital tests and procedure/radiological results at the follow up, please ask your Primary MD to get all Hospital records sent to his/her office.  In some cases, they will be blood work, cultures and biopsy results pending at the time of your  discharge. Please request that your primary care M.D. follows up on these results.  Also Note the following: If you experience worsening of your admission symptoms, develop shortness of breath, life threatening emergency, suicidal or homicidal thoughts you must seek medical attention immediately by calling 911 or calling your MD immediately  if  symptoms less severe.  You must read complete instructions/literature along with all the possible adverse reactions/side effects for all the Medicines you take and that have been prescribed to you. Take any new Medicines after you have completely understood and accpet all the possible adverse reactions/side effects.   Do not drive when taking Pain medications or sleeping medications (Benzodaizepines)  Do not take more than prescribed Pain, Sleep and Anxiety Medications. It is not advisable to combine anxiety,sleep and pain medications without talking with your primary care practitioner  Special Instructions: If you have smoked or chewed Tobacco  in the last 2 yrs please stop smoking, stop any regular Alcohol  and or any Recreational drug use.  Wear Seat belts while driving.  Please note: You were cared for by a hospitalist during your hospital stay. Once you are discharged, your primary care physician will handle any further medical issues. Please note that NO REFILLS for any discharge medications will be authorized once you are discharged, as it is imperative that you return to your primary care physician (or establish a relationship with a primary care physician if you do not have one) for your post hospital discharge needs so that they can reassess your need for medications and monitor your lab values.   Please follow-up with neurosurgeon Dr. Johnsie Cancel in 2 weeks  Please follow-up with urologist-Dr. Alvester Morin in 2 weeks  Check capillary blood glucose before meals and at bedtime   Increase activity slowly   Complete by: As directed    No wound care   Complete by: As directed       Allergies as of 12/01/2020   No Known Allergies      Medication List     STOP taking these medications    loratadine 10 MG tablet Commonly known as: CLARITIN   Mucinex DM Maximum Strength 60-1200 MG Tb12       TAKE these medications    acetaminophen 500 MG tablet Commonly known as: TYLENOL Take  1,000 mg by mouth every 6 (six) hours as needed for mild pain or headache.   albuterol 108 (90 Base) MCG/ACT inhaler Commonly known as: VENTOLIN HFA 2 puffs as needed   aspirin EC 81 MG tablet Take 1 tablet (81 mg total) by mouth daily.   atorvastatin 40 MG tablet Commonly known as: LIPITOR Take 40 mg by mouth daily.   brimonidine 0.2 % ophthalmic solution Commonly known as: ALPHAGAN Place 1 drop into both eyes 2 (two) times daily.   Centrum Silver tablet Take 1 tablet by mouth daily with breakfast.   dorzolamide 2 % ophthalmic solution Commonly known as: TRUSOPT Place 1 drop into both eyes 2 (two) times daily.   insulin aspart 100 UNIT/ML injection Commonly known as: novoLOG 0-15 Units, Subcutaneous, 3 times daily with meals CBG < 70: Implement Hypoglycemia measures CBG 70 - 120: 0 units CBG 121 - 150: 2 units CBG 151 - 200: 3 units CBG 201 - 250: 5 units CBG 251 - 300: 8 units CBG 301 - 350: 11 units CBG 351 - 400: 15 units CBG > 400: call MD   latanoprost 0.005 % ophthalmic solution Commonly known as: XALATAN Place 1 drop  into the right eye at bedtime. What changed:  how much to take how to take this when to take this   lisinopril 10 MG tablet Commonly known as: ZESTRIL Take 10 mg by mouth daily.   metFORMIN 500 MG tablet Commonly known as: GLUCOPHAGE Take 500 mg by mouth 2 (two) times daily with a meal.   metoprolol tartrate 25 MG tablet Commonly known as: LOPRESSOR Take 25 mg by mouth 2 (two) times daily with a meal.   pantoprazole 40 MG tablet Commonly known as: PROTONIX Take 40 mg by mouth daily before breakfast.   tamsulosin 0.4 MG Caps capsule Commonly known as: FLOMAX Take 1 capsule (0.4 mg total) by mouth in the morning and at bedtime.   vitamin C 1000 MG tablet Take 1,000 mg by mouth daily.        Contact information for follow-up providers     Soundra Pilon, FNP. Schedule an appointment as soon as possible for a visit in 1 week(s).    Specialty: Family Medicine Contact information: [address] [place] [address] [phone]         Jadene Pierini, MD Follow up in 2 week(s).   Specialty: Neurosurgery Contact information: [address] [place] [address] [phone]         Ray Church III, MD Follow up in 2 week(s).   Specialty: Urology Contact information: [address] [place] [address] [phone]              Contact information for after-discharge care     Destination     [facility] .   Service: Skilled Nursing Contact information: [address] [place] [address] [phone]                    No Known Allergies    Consultations: Neurology, neurosurgery   Other Procedures/Studies: DG Cervical Spine 2 or 3 views  Result Date: 11/28/2020 CLINICAL DATA:  ACDF EXAM: CERVICAL SPINE - 2-3 VIEW COMPARISON:  04/25/2020 FINDINGS: High single fluoroscopic images obtained during the performance of the procedure and is provided for interpretation only. Evaluation is limited due to small field of view. Anterior plate and screw fixation are seen, but the level cannot be specified due to field of view limitations. Please refer to operative report. FLUOROSCOPY TIME:  22 seconds IMPRESSION: 1. Intraoperative evaluation as above. Please refer to the operative report. Electronically Signed   By: Sharlet Salina M.D.   On: 11/28/2020 22:08   CT CERVICAL SPINE WO CONTRAST  Result Date: 11/26/2020 CLINICAL DATA:  85 year old male with lower extremity weakness. C7-T1 spinal stenosis with cord mass effect and possible myelomalacia. Preoperative exam. EXAM: CT CERVICAL SPINE WITHOUT CONTRAST TECHNIQUE: Multidetector CT imaging of the cervical spine was performed without intravenous contrast. Multiplanar CT image reconstructions were also generated. COMPARISON:  Total spine MRI yesterday. FINDINGS: Alignment:  Moderate levoconvex cervical scoliosis. Straightening of cervical lordosis. Subtle anterolisthesis of C7 on T1. Bilateral posterior element alignment is within normal limits. Skull base and vertebrae: Bone mineralization is within normal limits for age. Visualized skull base is intact. No atlanto-occipital dissociation. No acute osseous abnormality identified. Soft tissues and spinal canal: Partially retropharyngeal course of both carotids in the lower neck. Negative visible noncontrast neck soft tissues. Disc levels: C1-C2: Severe left side joint space loss with vacuum phenomena and bone-on-bone appearance (coronal image 24). C2-C3:  Solid interbody and posterior element ankylosis. C3-C4: Subtle anterolisthesis  but solid interbody and posterior element ankylosis. C4-C5: Vacuum disc and vacuum facet on the right. Severe right side facet hypertrophy. Bulky disc osteophyte complex. Possible developing facet ankylosis at this level more so on the left (sagittal image 42) but no definite bridging bone at this time. C5-C6:  Interbody and posterior element ankylosis. C6-C7:  Interbody and posterior element ankylosis. C7-T1: Mild anterolisthesis. Disc space loss and vacuum disc. Moderate facet hypertrophy greater on the right. T1-T2: Mild circumferential disc bulge. Mild facet hypertrophy. No ankylosis. Upper chest: Apical emphysema visible on the right. Other: Negative visible noncontrast posterior fossa. IMPRESSION: 1. 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 2. Subsequent severe degeneration at C7-T1 (mild anterolisthesis), and the other unfused levels C1-C2 (on the left), and C4-C5 (severe facet arthropathy with questionable developing facet fusion). 3. No visible upper thoracic ankylosis. Electronically Signed   By: Odessa Fleming M.D.   On: 11/26/2020 11:10   MR Cervical Spine Wo Contrast  Result Date: 11/25/2020 CLINICAL DATA:  Motor neuron disease abnormal c7 on thoracic  image and lower ext weakness EXAM: MRI CERVICAL SPINE WITHOUT CONTRAST TECHNIQUE: Multiplanar, multisequence MR imaging of the cervical spine was performed. No intravenous contrast was administered. COMPARISON:  None. FINDINGS: Alignment: Reversal of the normal cervical lordosis. Approximately 3 mm of anterolisthesis of C3 on C4 and C4 on C5. Vertebrae: Multilevel degenerative/discogenic endplate signal changes. No specific evidence of acute fracture or discitis/osteomyelitis. Cord: Motion limited evaluation with possible slight T2 hyperintensity of the cord at C7-T1. Posterior Fossa, vertebral arteries, paraspinal tissues: Diminished right vertebral artery flow void. Left vertebral artery flow void is maintained. No evidence of acute abnormality in the visualized posterior fossa. Disc levels: C1-C2: Severe craniocervical degenerative change, greatest on the left with bulky posterior element hypertrophy contacting and indenting the left eccentric cord with overall moderate canal stenosis. C2-C3: Posterior disc osteophyte complex and bilateral facet and uncovertebral hypertrophy. Moderate left and mild right foraminal stenosis. Mild to moderate canal stenosis. C3-C4: Approximately 3 mm of anterolisthesis of C3 on C4. Posterior disc osteophyte complex and bilateral facet and uncovertebral hypertrophy. Resultant severe left and moderate right foraminal stenosis with moderate canal stenosis. C4-C5: Proximally 3 mm of anterolisthesis of C4 on C5 with posterior disc osteophyte complex seen bilateral facet and uncovertebral hypertrophy. Resulting severe bilateral foraminal stenosis with moderate canal stenosis. C5-C6: Posterior disc osteophyte complex with bilateral facet and uncovertebral hypertrophy. Resulting moderate bilateral foraminal stenosis with mild canal stenosis. C6-C7: Posterior endplate spurring with bilateral facet and uncovertebral hypertrophy. Resulting mild to moderate bilateral foraminal stenosis and  canal stenosis. C7-T1: Posterior disc osteophyte complex with bulky ligamentum flavum thickening and severe bilateral facet and uncovertebral hypertrophy. Resulting in severe canal and bilateral foraminal stenosis with deformity of the cord. IMPRESSION: 1. At C7-T1, severe canal stenosis with deformity of the cord. Possible slight T2 hyperintensity of the cord at this level could represent edema and/or myelomalacia. Moderate canal stenosis at C1-C2, C3-C4, C4-C5. 2. Severe foraminal stenosis on the left at C3-C4 and bilaterally at C4-C5 and C7-T1. Moderate foraminal stenosis on the left at C2-C3, right at C3-C4, and bilaterally at C5-C6 and C6-C7. 3. Diminished right vertebral artery flow void, which could be related to proximal stenosis or occlusion. Consider CTA neck to further evaluate. Electronically Signed   By: Feliberto Harts M.D.   On: 11/25/2020 20:53   MR THORACIC SPINE WO CONTRAST  Result Date: 11/25/2020 CLINICAL DATA:  Mid-back pain, neuro deficit EXAM:  MRI THORACIC SPINE WITHOUT CONTRAST TECHNIQUE: Multiplanar, multisequence MR imaging of the thoracic spine was performed. No intravenous contrast was administered. COMPARISON:  None. FINDINGS: Alignment:  Normal. Vertebrae: Vertebral body heights are maintained. No focal marrow edema to suggest acute fracture or discitis/osteomyelitis. No suspicious bone lesions. Cord: No clear cord signal abnormality; however, the cervical cord is poorly evaluated at C7-T1 in the region of stenosis. Paraspinal and other soft tissues: Likely trace bilateral pleural effusions. Disc levels: At C7-T1 there is posterior disc osteophyte complex with ligamentum flavum thickening and likely severe canal stenosis, which is partially imaged. Small disc bulges at T2-T3, T5-T6 and T7-T8 with ligamentum flavum thickening and mild canal stenosis. Multilevel facet hypertrophy with moderate bilateral foraminal stenosis at T10-T11 and T11-T12, greatest on the left at T10-T11. Mild  to moderate right foraminal stenosis at T9-T10. Otherwise, multilevel mild foraminal stenosis in the thoracic spine. IMPRESSION: 1. At C7-T1, likely severe canal stenosis that is partially imaged. Probable foraminal stenosis as well, poorly evaluated. Recommend MRI of the cervical spine to further evaluate. 2. Moderate bilateral foraminal stenosis at T10-T11 and T11-T12, greatest on the left at T10-T11. Mild to moderate right foraminal stenosis at T9-T10 and multilevel mild foraminal stenosis. 3. Mild multilevel canal stenosis in the thoracic spine. Electronically Signed   By: Feliberto Harts M.D.   On: 11/25/2020 12:20   MR LUMBAR SPINE WO CONTRAST  Result Date: 11/25/2020 CLINICAL DATA:  Low back pain, cauda equina syndrome suspected EXAM: MRI LUMBAR SPINE WITHOUT CONTRAST TECHNIQUE: Multiplanar, multisequence MR imaging of the lumbar spine was performed. No intravenous contrast was administered. COMPARISON:  None. FINDINGS: Segmentation:  Standard. Alignment: 4 mm grade 1 anterolisthesis L4 on L5. Slight retrolisthesis of L5 on S1. Vertebrae: No fracture, evidence of discitis, or bone lesion. The L2 and L3 vertebral bodies are partially fused anteriorly. Diffuse intrinsic canal narrowing on the basis of congenitally short pedicles. Conus medullaris and cauda equina: Conus extends to the L1-2 level. Conus and cauda equina appear normal. Paraspinal and other soft tissues: Negative. Disc levels: T12-L1: No disc protrusion. Unremarkable facets. No foraminal or canal stenosis. L1-L2: Mild diffuse disc bulge. Mild bilateral facet arthropathy and ligamentum flavum buckling. Findings contribute to moderate canal stenosis with moderate to severe bilateral foraminal stenosis, left worse than right. L2-L3: Mild endplate ridging. Bilateral facet arthropathy and mild ligamentum flavum buckling. Findings result in mild-to-moderate canal stenosis with mild bilateral foraminal stenosis. L3-L4: Diffuse disc osteophyte  complex with right paracentral disc protrusion. Moderate bilateral facet arthropathy and ligamentum flavum buckling. Findings result in severe canal stenosis with moderate bilateral foraminal stenosis, right worse than left. L4-L5: Diffuse disc bulge and endplate ridging. Right worse than left facet arthropathy. Moderate to severe canal stenosis with mild-to-moderate bilateral foraminal stenosis. L5-S1: Diffuse disc bulge and endplate ridging with moderate bilateral facet arthropathy. Findings result in moderate to severe canal stenosis with severe bilateral foraminal stenosis. IMPRESSION: 1. Advanced multilevel degenerative changes of the lumbar spine superimposed on a congenitally narrow canal. Findings are most pronounced at the L3-4 level where there is severe canal stenosis and moderate bilateral foraminal stenosis. 2. Moderate-to-severe canal stenosis at L4-5 and L5-S1. 3. Severe bilateral foraminal stenosis at L5-S1. Electronically Signed   By: Duanne Guess D.O.   On: 11/25/2020 12:24   DG C-Arm 1-60 Min-No Report  Result Date: 11/28/2020 Fluoroscopy was utilized by the requesting physician.  No radiographic interpretation.   VAS [place] LOWER EXTREMITY VENOUS (DVT)  Result Date: 11/28/2020  Lower Venous DVT  Study Patient Name:  KIEL COCKERELL  Date of Exam:   11/27/2020 Medical Rec #: [id number]      Accession #:    [id number] Date of Birth: [birth year]      Patient Gender: M Patient Age:   11 years Exam Location:  [facility] Procedure:      VAS [place] LOWER EXTREMITY VENOUS (DVT) Referring Phys: DAWOOD ELGERGAWY --------------------------------------------------------------------------------  Indications: Edema.  Risk Factors: None identified. Limitations: Poor ultrasound/tissue interface. Comparison Study: No prior studies. Performing Technologist: Chanda Busing RVT  Examination Guidelines: A complete evaluation includes B-mode imaging, spectral Doppler, color Doppler, and power Doppler as needed  of all accessible portions of each vessel. Bilateral testing is considered an integral part of a complete examination. Limited examinations for reoccurring indications may be performed as noted. The reflux portion of the exam is performed with the patient in reverse Trendelenburg.  +---------+---------------+---------+-----------+----------+--------------+ RIGHT    CompressibilityPhasicitySpontaneityPropertiesThrombus Aging +---------+---------------+---------+-----------+----------+--------------+ CFV      Full           Yes      Yes                                 +---------+---------------+---------+-----------+----------+--------------+ SFJ      Full                                                        +---------+---------------+---------+-----------+----------+--------------+ FV Prox  Full                                                        +---------+---------------+---------+-----------+----------+--------------+ FV Mid   Full                                                        +---------+---------------+---------+-----------+----------+--------------+ FV DistalFull                                                        +---------+---------------+---------+-----------+----------+--------------+ PFV      Full                                                        +---------+---------------+---------+-----------+----------+--------------+ POP      Full           Yes      Yes                                 +---------+---------------+---------+-----------+----------+--------------+ PTV      Full                                                        +---------+---------------+---------+-----------+----------+--------------+  PERO     Full                                                        +---------+---------------+---------+-----------+----------+--------------+    +---------+---------------+---------+-----------+----------+-------------------+ LEFT     CompressibilityPhasicitySpontaneityPropertiesThrombus Aging      +---------+---------------+---------+-----------+----------+-------------------+ CFV      Full           Yes      Yes                                      +---------+---------------+---------+-----------+----------+-------------------+ SFJ      Full                                                             +---------+---------------+---------+-----------+----------+-------------------+ FV Prox  Full                                                             +---------+---------------+---------+-----------+----------+-------------------+ FV Mid   Full                                                             +---------+---------------+---------+-----------+----------+-------------------+ FV Distal               Yes      Yes                                      +---------+---------------+---------+-----------+----------+-------------------+ PFV      Full                                                             +---------+---------------+---------+-----------+----------+-------------------+ POP      Full           Yes      Yes                                      +---------+---------------+---------+-----------+----------+-------------------+ PTV      Full                                                             +---------+---------------+---------+-----------+----------+-------------------+ PERO  Not well visualized +---------+---------------+---------+-----------+----------+-------------------+     Summary: RIGHT: - There is no evidence of deep vein thrombosis in the lower extremity. However, portions of this examination were limited- see technologist comments above.  - No cystic structure found in the popliteal fossa.  LEFT: - There is  no evidence of deep vein thrombosis in the lower extremity. However, portions of this examination were limited- see technologist comments above.  - No cystic structure found in the popliteal fossa.  *See table(s) above for measurements and observations. Electronically signed by Lemar Livings MD on 11/28/2020 at 11:10:25 PM.    Final      TODAY-DAY OF DISCHARGE:  Subjective:   Magdalene Molly today has no headache,no chest abdominal pain,no new weakness tingling or numbness, feels much better wants to go home today.   Objective:   Blood pressure (!) 142/80, pulse 67, temperature 98.2 F (36.8 C), temperature source Oral, resp. rate 15, height 5\' 9"  (1.753 m), weight 89.5 kg, SpO2 98 %.  Intake/Output Summary (Last 24 hours) at 12/01/2020 1012 Last data filed at 12/01/2020 0600 Gross per 24 hour  Intake 240 ml  Output 2650 ml  Net -2410 ml   Filed Weights   11/25/20 0823 11/28/20 0330  Weight: 89.8 kg 89.5 kg    Exam: Awake Alert, Oriented *3, No new F.N deficits, Normal affect [place].AT,PERRAL Supple Neck,No JVD, No cervical lymphadenopathy appriciated.  Symmetrical Chest wall movement, Good air movement bilaterally, CTAB RRR,No Gallops,Rubs or new Murmurs, No Parasternal Heave +ve B.Sounds, Abd Soft, Non tender, No organomegaly appriciated, No rebound -guarding or rigidity. No Cyanosis, Clubbing or edema, No new Rash or bruise   PERTINENT RADIOLOGIC STUDIES: No results found.   PERTINENT LAB RESULTS: CBC: Recent Labs    11/29/20 0209 11/30/20 0054  WBC 5.8 7.5  HGB 12.5* 12.8*  HCT 39.4 39.6  PLT 138* 137*   CMET CMP     Component Value Date/Time   NA 138 11/30/2020 0054   K 4.1 11/30/2020 0054   CL 105 11/30/2020 0054   CO2 26 11/30/2020 0054   GLUCOSE 190 (H) 11/30/2020 0054   BUN 17 11/30/2020 0054   CREATININE 0.79 11/30/2020 0054   CALCIUM 8.8 (L) 11/30/2020 0054   GFRNONAA >60 11/30/2020 0054    GFR Estimated Creatinine Clearance: 77.4 mL/min (by C-G  formula based on SCr of 0.79 mg/dL). No results for input(s): LIPASE, AMYLASE in the last 72 hours. No results for input(s): CKTOTAL, CKMB, CKMBINDEX, TROPONINI in the last 72 hours. Invalid input(s): POCBNP No results for input(s): DDIMER in the last 72 hours. No results for input(s): HGBA1C in the last 72 hours. No results for input(s): CHOL, HDL, LDLCALC, TRIG, CHOLHDL, LDLDIRECT in the last 72 hours. No results for input(s): TSH, T4TOTAL, T3FREE, THYROIDAB in the last 72 hours.  Invalid input(s): FREET3 No results for input(s): VITAMINB12, FOLATE, FERRITIN, TIBC, IRON, RETICCTPCT in the last 72 hours. Coags: No results for input(s): INR in the last 72 hours.  Invalid input(s): PT Microbiology: Recent Results (from the past 240 hour(s))  Urine Culture     Status: Abnormal   Collection Time: 11/25/20  8:56 AM   Specimen: Urine, Catheterized  Result Value Ref Range Status   Specimen Description   Final    URINE, CATHETERIZED Performed at [facility], [address], [place], [place]    Special Requests   Final    NONE Performed at [facility], [address] [place],  [place] [address]    Culture (A)  Final    >=100,000 COLONIES/mL ENTEROBACTER AEROGENES >=100,000 COLONIES/mL AEROCOCCUS URINAE    Report Status 11/28/2020 FINAL  Final   Organism ID, Bacteria ENTEROBACTER AEROGENES (A)  Final      Susceptibility   Enterobacter aerogenes - MIC*    CEFAZOLIN RESISTANT Resistant     CEFEPIME <=0.12 SENSITIVE Sensitive     CEFTRIAXONE <=0.25 SENSITIVE Sensitive     CIPROFLOXACIN <=0.25 SENSITIVE Sensitive     GENTAMICIN <=1 SENSITIVE Sensitive     IMIPENEM <=0.25 SENSITIVE Sensitive     NITROFURANTOIN <=16 SENSITIVE Sensitive     TRIMETH/SULFA <=20 SENSITIVE Sensitive     PIP/TAZO <=4 SENSITIVE Sensitive     * >=100,000 COLONIES/mL ENTEROBACTER AEROGENES  SARS CORONAVIRUS 2 (TAT 6-24 HRS) Nasopharyngeal Nasopharyngeal Swab      Status: None   Collection Time: 11/25/20  4:53 PM   Specimen: Nasopharyngeal Swab  Result Value Ref Range Status   SARS Coronavirus 2 NEGATIVE NEGATIVE Final    Comment: (NOTE) SARS-CoV-2 target nucleic acids are NOT DETECTED.  The SARS-CoV-2 RNA is generally detectable in upper and lower respiratory specimens during the acute phase of infection. Negative results do not preclude SARS-CoV-2 infection, do not rule out co-infections with other pathogens, and should not be used as the sole basis for treatment or other patient management decisions. Negative results must be combined with clinical observations, patient history, and epidemiological information. The expected result is Negative.  Fact Sheet for Patients: HairSlick.no  Fact Sheet for Healthcare Providers: quierodirigir.com  This test is not yet approved or cleared by the [place] FDA and  has been authorized for detection and/or diagnosis of SARS-CoV-2 by FDA under an Emergency Use Authorization (EUA). This EUA will remain  in effect (meaning this test can be used) for the duration of the COVID-19 declaration under Se ction 564(b)(1) of the Act, 21 U.S.C. section 360bbb-3(b)(1), unless the authorization is terminated or revoked sooner.  Performed at [facility], [address] [place], [place] [address]   Surgical pcr screen     Status: None   Collection Time: 11/28/20  7:16 PM   Specimen: Nasal Mucosa; Nasal Swab  Result Value Ref Range Status   MRSA, PCR NEGATIVE NEGATIVE Final   Staphylococcus aureus NEGATIVE NEGATIVE Final    Comment: (NOTE) The Xpert SA Assay (FDA approved for NASAL specimens in patients 61 years of age and older), is one component of a comprehensive surveillance program. It is not intended to diagnose infection nor to guide or monitor treatment. Performed at [facility], [address] [place], [place] [address]    SARS CORONAVIRUS 2 (TAT 6-24 HRS) Nasopharyngeal Nasopharyngeal Swab     Status: None   Collection Time: 11/30/20 10:50 AM   Specimen: Nasopharyngeal Swab  Result Value Ref Range Status   SARS Coronavirus 2 NEGATIVE NEGATIVE Final    Comment: (NOTE) SARS-CoV-2 target nucleic acids are NOT DETECTED.  The SARS-CoV-2 RNA is generally detectable in upper and lower respiratory specimens during the acute phase of infection. Negative results do not preclude SARS-CoV-2 infection, do not rule out co-infections with other pathogens, and should not be used as the sole basis for treatment or other patient management decisions. Negative results must be combined with clinical observations, patient history, and epidemiological information. The expected result is Negative.  Fact Sheet for Patients: HairSlick.no  Fact Sheet for Healthcare Providers: quierodirigir.com  This test is not yet approved or  cleared by the [place] and  has been authorized for detection and/or diagnosis of SARS-CoV-2 by FDA under an Emergency Use Authorization (EUA). This EUA will remain  in effect (meaning this test can be used) for the duration of the COVID-19 declaration under Se ction 564(b)(1) of the Act, 21 U.S.C. section 360bbb-3(b)(1), unless the authorization is terminated or revoked sooner.  Performed at [facility], [address], [place], [place] [address]     FURTHER DISCHARGE INSTRUCTIONS:  Get Medicines reviewed and adjusted: Please take all your medications with you for your next visit with your Primary MD  Laboratory/radiological data: Please request your Primary MD to go over all hospital tests and procedure/radiological results at the follow up, please ask your Primary MD to get all Hospital records sent to his/her office.  In some cases, they will be blood work, cultures and biopsy results pending at the time of your  discharge. Please request that your primary care M.D. goes through all the records of your hospital data and follows up on these results.  Also Note the following: If you experience worsening of your admission symptoms, develop shortness of breath, life threatening emergency, suicidal or homicidal thoughts you must seek medical attention immediately by calling 911 or calling your MD immediately  if symptoms less severe.  You must read complete instructions/literature along with all the possible adverse reactions/side effects for all the Medicines you take and that have been prescribed to you. Take any new Medicines after you have completely understood and accpet all the possible adverse reactions/side effects.   Do not drive when taking Pain medications or sleeping medications (Benzodaizepines)  Do not take more than prescribed Pain, Sleep and Anxiety Medications. It is not advisable to combine anxiety,sleep and pain medications without talking with your primary care practitioner  Special Instructions: If you have smoked or chewed Tobacco  in the last 2 yrs please stop smoking, stop any regular Alcohol  and or any Recreational drug use.  Wear Seat belts while driving.  Please note: You were cared for by a hospitalist during your hospital stay. Once you are discharged, your primary care physician will handle any further medical issues. Please note that NO REFILLS for any discharge medications will be authorized once you are discharged, as it is imperative that you return to your primary care physician (or establish a relationship with a primary care physician if you do not have one) for your post hospital discharge needs so that they can reassess your need for medications and monitor your lab values.  Total Time spent coordinating discharge including counseling, education and face to face time equals 35 minutes.  SignedJeoffrey Massed 12/01/2020 10:12 AM

## 2020-12-01 NOTE — TOC Transition Note (Signed)
Transition of Care [facility]) - CM/SW Discharge Note   Patient Details  Name: Michael Jensen MRN: [id number] Date of Birth: [birth year]  [facility] CM/SW Contact:  Mearl Latin, LCSW Phone Number: 12/01/2020, 2:37 PM   Clinical Narrative:    Patient will DC to: [facility] Anticipated DC date: 12/01/20 Family notified: Daughter, Secondary school teacher by: Dayna Barker   Per MD patient ready for DC to [facility]. RN to call report prior to discharge [phone] room 3205). RN, patient, patient's family, and facility notified of DC. Discharge Summary and FL2 sent to facility. DC packet on chart. Ambulance transport requested for patient.   CSW will sign off for now as social work intervention is no longer needed. Please consult [place] again if new needs arise.     Final next level of care: Skilled Nursing Facility Barriers to Discharge: Barriers Resolved   Patient Goals and CMS Choice Patient states their goals for this hospitalization and ongoing recovery are:: Rehab CMS Medicare.gov Compare Post Acute Care list provided to:: Patient Choice offered to / list presented to : Patient, Adult Children  Discharge Placement   Existing PASRR number confirmed : 12/01/20          Patient [facility] bed at: [facility] Patient to be transferred to facility by: [facility] Name of family member notified: daughter Patient and family notified of of transfer: 12/01/20  Discharge Plan and Services In-house Referral: Clinical Social Work   Post Acute Care Choice: Skilled Nursing Facility                               Social Determinants of Health (SDOH) Interventions     Readmission Risk Interventions No flowsheet data found.

## 2020-12-02 DIAGNOSIS — E119 Type 2 diabetes mellitus without complications: Secondary | ICD-10-CM | POA: Diagnosis not present

## 2020-12-02 DIAGNOSIS — N390 Urinary tract infection, site not specified: Secondary | ICD-10-CM | POA: Diagnosis not present

## 2020-12-02 DIAGNOSIS — H548 Legal blindness, as defined in USA: Secondary | ICD-10-CM | POA: Diagnosis not present

## 2020-12-02 DIAGNOSIS — I10 Essential (primary) hypertension: Secondary | ICD-10-CM | POA: Diagnosis not present

## 2020-12-02 DIAGNOSIS — E785 Hyperlipidemia, unspecified: Secondary | ICD-10-CM | POA: Diagnosis not present

## 2020-12-02 DIAGNOSIS — G629 Polyneuropathy, unspecified: Secondary | ICD-10-CM | POA: Diagnosis not present

## 2020-12-02 DIAGNOSIS — M4800 Spinal stenosis, site unspecified: Secondary | ICD-10-CM | POA: Diagnosis not present

## 2020-12-02 DIAGNOSIS — K219 Gastro-esophageal reflux disease without esophagitis: Secondary | ICD-10-CM | POA: Diagnosis not present

## 2020-12-02 DIAGNOSIS — M6281 Muscle weakness (generalized): Secondary | ICD-10-CM | POA: Diagnosis not present

## 2020-12-05 DIAGNOSIS — I10 Essential (primary) hypertension: Secondary | ICD-10-CM | POA: Diagnosis not present

## 2020-12-05 DIAGNOSIS — M6281 Muscle weakness (generalized): Secondary | ICD-10-CM | POA: Diagnosis not present

## 2020-12-05 DIAGNOSIS — E119 Type 2 diabetes mellitus without complications: Secondary | ICD-10-CM | POA: Diagnosis not present

## 2020-12-05 DIAGNOSIS — K5900 Constipation, unspecified: Secondary | ICD-10-CM | POA: Diagnosis not present

## 2020-12-05 DIAGNOSIS — R339 Retention of urine, unspecified: Secondary | ICD-10-CM | POA: Diagnosis not present

## 2020-12-07 ENCOUNTER — Ambulatory Visit: Payer: Medicare HMO | Admitting: Podiatry

## 2020-12-07 DIAGNOSIS — R339 Retention of urine, unspecified: Secondary | ICD-10-CM | POA: Diagnosis not present

## 2020-12-07 DIAGNOSIS — I10 Essential (primary) hypertension: Secondary | ICD-10-CM | POA: Diagnosis not present

## 2020-12-07 DIAGNOSIS — M4800 Spinal stenosis, site unspecified: Secondary | ICD-10-CM | POA: Diagnosis not present

## 2020-12-07 DIAGNOSIS — E1165 Type 2 diabetes mellitus with hyperglycemia: Secondary | ICD-10-CM | POA: Diagnosis not present

## 2020-12-14 DIAGNOSIS — M6281 Muscle weakness (generalized): Secondary | ICD-10-CM | POA: Diagnosis not present

## 2020-12-14 DIAGNOSIS — R338 Other retention of urine: Secondary | ICD-10-CM | POA: Diagnosis not present

## 2020-12-14 DIAGNOSIS — N390 Urinary tract infection, site not specified: Secondary | ICD-10-CM | POA: Diagnosis not present

## 2020-12-14 DIAGNOSIS — R252 Cramp and spasm: Secondary | ICD-10-CM | POA: Diagnosis not present

## 2020-12-14 DIAGNOSIS — E119 Type 2 diabetes mellitus without complications: Secondary | ICD-10-CM | POA: Diagnosis not present

## 2020-12-14 DIAGNOSIS — G629 Polyneuropathy, unspecified: Secondary | ICD-10-CM | POA: Diagnosis not present

## 2020-12-14 DIAGNOSIS — R339 Retention of urine, unspecified: Secondary | ICD-10-CM | POA: Diagnosis not present

## 2020-12-14 DIAGNOSIS — N401 Enlarged prostate with lower urinary tract symptoms: Secondary | ICD-10-CM | POA: Diagnosis not present

## 2020-12-14 DIAGNOSIS — I10 Essential (primary) hypertension: Secondary | ICD-10-CM | POA: Diagnosis not present

## 2020-12-14 DIAGNOSIS — N400 Enlarged prostate without lower urinary tract symptoms: Secondary | ICD-10-CM | POA: Diagnosis not present

## 2020-12-15 DIAGNOSIS — L89323 Pressure ulcer of left buttock, stage 3: Secondary | ICD-10-CM | POA: Diagnosis not present

## 2020-12-15 DIAGNOSIS — N400 Enlarged prostate without lower urinary tract symptoms: Secondary | ICD-10-CM | POA: Diagnosis not present

## 2020-12-15 DIAGNOSIS — M4714 Other spondylosis with myelopathy, thoracic region: Secondary | ICD-10-CM | POA: Diagnosis not present

## 2020-12-15 DIAGNOSIS — I10 Essential (primary) hypertension: Secondary | ICD-10-CM | POA: Diagnosis not present

## 2020-12-15 DIAGNOSIS — M6281 Muscle weakness (generalized): Secondary | ICD-10-CM | POA: Diagnosis not present

## 2020-12-15 DIAGNOSIS — R252 Cramp and spasm: Secondary | ICD-10-CM | POA: Diagnosis not present

## 2020-12-15 DIAGNOSIS — R339 Retention of urine, unspecified: Secondary | ICD-10-CM | POA: Diagnosis not present

## 2020-12-22 DIAGNOSIS — R252 Cramp and spasm: Secondary | ICD-10-CM | POA: Diagnosis not present

## 2020-12-22 DIAGNOSIS — M6281 Muscle weakness (generalized): Secondary | ICD-10-CM | POA: Diagnosis not present

## 2020-12-22 DIAGNOSIS — R296 Repeated falls: Secondary | ICD-10-CM | POA: Diagnosis not present

## 2020-12-22 DIAGNOSIS — M4800 Spinal stenosis, site unspecified: Secondary | ICD-10-CM | POA: Diagnosis not present

## 2020-12-22 DIAGNOSIS — G629 Polyneuropathy, unspecified: Secondary | ICD-10-CM | POA: Diagnosis not present

## 2020-12-22 DIAGNOSIS — I10 Essential (primary) hypertension: Secondary | ICD-10-CM | POA: Diagnosis not present

## 2020-12-22 DIAGNOSIS — R2681 Unsteadiness on feet: Secondary | ICD-10-CM | POA: Diagnosis not present

## 2020-12-22 DIAGNOSIS — R339 Retention of urine, unspecified: Secondary | ICD-10-CM | POA: Diagnosis not present

## 2020-12-29 DIAGNOSIS — L89620 Pressure ulcer of left heel, unstageable: Secondary | ICD-10-CM | POA: Diagnosis not present

## 2020-12-31 DIAGNOSIS — M4800 Spinal stenosis, site unspecified: Secondary | ICD-10-CM | POA: Diagnosis not present

## 2020-12-31 DIAGNOSIS — G629 Polyneuropathy, unspecified: Secondary | ICD-10-CM | POA: Diagnosis not present

## 2020-12-31 DIAGNOSIS — R339 Retention of urine, unspecified: Secondary | ICD-10-CM | POA: Diagnosis not present

## 2020-12-31 DIAGNOSIS — R252 Cramp and spasm: Secondary | ICD-10-CM | POA: Diagnosis not present

## 2020-12-31 DIAGNOSIS — I10 Essential (primary) hypertension: Secondary | ICD-10-CM | POA: Diagnosis not present

## 2021-01-06 DIAGNOSIS — N401 Enlarged prostate with lower urinary tract symptoms: Secondary | ICD-10-CM | POA: Diagnosis not present

## 2021-01-06 DIAGNOSIS — R338 Other retention of urine: Secondary | ICD-10-CM | POA: Diagnosis not present

## 2021-01-14 DIAGNOSIS — I10 Essential (primary) hypertension: Secondary | ICD-10-CM | POA: Diagnosis not present

## 2021-01-14 DIAGNOSIS — G629 Polyneuropathy, unspecified: Secondary | ICD-10-CM | POA: Diagnosis not present

## 2021-01-14 DIAGNOSIS — H548 Legal blindness, as defined in USA: Secondary | ICD-10-CM | POA: Diagnosis not present

## 2021-01-14 DIAGNOSIS — K219 Gastro-esophageal reflux disease without esophagitis: Secondary | ICD-10-CM | POA: Diagnosis not present

## 2021-01-14 DIAGNOSIS — K5900 Constipation, unspecified: Secondary | ICD-10-CM | POA: Diagnosis not present

## 2021-01-14 DIAGNOSIS — M6281 Muscle weakness (generalized): Secondary | ICD-10-CM | POA: Diagnosis not present

## 2021-01-14 DIAGNOSIS — R339 Retention of urine, unspecified: Secondary | ICD-10-CM | POA: Diagnosis not present

## 2021-01-14 DIAGNOSIS — N400 Enlarged prostate without lower urinary tract symptoms: Secondary | ICD-10-CM | POA: Diagnosis not present

## 2021-01-14 DIAGNOSIS — R252 Cramp and spasm: Secondary | ICD-10-CM | POA: Diagnosis not present

## 2021-01-18 DIAGNOSIS — H548 Legal blindness, as defined in USA: Secondary | ICD-10-CM | POA: Diagnosis not present

## 2021-01-18 DIAGNOSIS — R339 Retention of urine, unspecified: Secondary | ICD-10-CM | POA: Diagnosis not present

## 2021-01-18 DIAGNOSIS — M6281 Muscle weakness (generalized): Secondary | ICD-10-CM | POA: Diagnosis not present

## 2021-01-18 DIAGNOSIS — R252 Cramp and spasm: Secondary | ICD-10-CM | POA: Diagnosis not present

## 2021-01-18 DIAGNOSIS — G629 Polyneuropathy, unspecified: Secondary | ICD-10-CM | POA: Diagnosis not present

## 2021-01-19 DIAGNOSIS — L89614 Pressure ulcer of right heel, stage 4: Secondary | ICD-10-CM | POA: Diagnosis not present

## 2021-01-22 DIAGNOSIS — R2681 Unsteadiness on feet: Secondary | ICD-10-CM | POA: Diagnosis not present

## 2021-01-22 DIAGNOSIS — R296 Repeated falls: Secondary | ICD-10-CM | POA: Diagnosis not present

## 2021-01-23 DIAGNOSIS — I10 Essential (primary) hypertension: Secondary | ICD-10-CM | POA: Diagnosis not present

## 2021-01-23 DIAGNOSIS — M4800 Spinal stenosis, site unspecified: Secondary | ICD-10-CM | POA: Diagnosis not present

## 2021-01-23 DIAGNOSIS — R339 Retention of urine, unspecified: Secondary | ICD-10-CM | POA: Diagnosis not present

## 2021-01-23 DIAGNOSIS — G629 Polyneuropathy, unspecified: Secondary | ICD-10-CM | POA: Diagnosis not present

## 2021-01-23 DIAGNOSIS — R252 Cramp and spasm: Secondary | ICD-10-CM | POA: Diagnosis not present

## 2021-01-25 DIAGNOSIS — R338 Other retention of urine: Secondary | ICD-10-CM | POA: Diagnosis not present

## 2021-01-25 DIAGNOSIS — N401 Enlarged prostate with lower urinary tract symptoms: Secondary | ICD-10-CM | POA: Diagnosis not present

## 2021-01-26 DIAGNOSIS — M170 Bilateral primary osteoarthritis of knee: Secondary | ICD-10-CM | POA: Diagnosis not present

## 2021-01-26 DIAGNOSIS — E782 Mixed hyperlipidemia: Secondary | ICD-10-CM | POA: Diagnosis not present

## 2021-01-26 DIAGNOSIS — K219 Gastro-esophageal reflux disease without esophagitis: Secondary | ICD-10-CM | POA: Diagnosis not present

## 2021-01-26 DIAGNOSIS — H409 Unspecified glaucoma: Secondary | ICD-10-CM | POA: Diagnosis not present

## 2021-01-26 DIAGNOSIS — L89614 Pressure ulcer of right heel, stage 4: Secondary | ICD-10-CM | POA: Diagnosis not present

## 2021-01-26 DIAGNOSIS — N400 Enlarged prostate without lower urinary tract symptoms: Secondary | ICD-10-CM | POA: Diagnosis not present

## 2021-01-26 DIAGNOSIS — I10 Essential (primary) hypertension: Secondary | ICD-10-CM | POA: Diagnosis not present

## 2021-01-26 DIAGNOSIS — E1140 Type 2 diabetes mellitus with diabetic neuropathy, unspecified: Secondary | ICD-10-CM | POA: Diagnosis not present

## 2021-01-29 DIAGNOSIS — R339 Retention of urine, unspecified: Secondary | ICD-10-CM | POA: Diagnosis not present

## 2021-01-29 DIAGNOSIS — H548 Legal blindness, as defined in USA: Secondary | ICD-10-CM | POA: Diagnosis not present

## 2021-01-29 DIAGNOSIS — G629 Polyneuropathy, unspecified: Secondary | ICD-10-CM | POA: Diagnosis not present

## 2021-01-29 DIAGNOSIS — N400 Enlarged prostate without lower urinary tract symptoms: Secondary | ICD-10-CM | POA: Diagnosis not present

## 2021-01-29 DIAGNOSIS — M6281 Muscle weakness (generalized): Secondary | ICD-10-CM | POA: Diagnosis not present

## 2021-01-29 DIAGNOSIS — K5900 Constipation, unspecified: Secondary | ICD-10-CM | POA: Diagnosis not present

## 2021-01-29 DIAGNOSIS — K219 Gastro-esophageal reflux disease without esophagitis: Secondary | ICD-10-CM | POA: Diagnosis not present

## 2021-02-04 DIAGNOSIS — R252 Cramp and spasm: Secondary | ICD-10-CM | POA: Diagnosis not present

## 2021-02-04 DIAGNOSIS — H548 Legal blindness, as defined in USA: Secondary | ICD-10-CM | POA: Diagnosis not present

## 2021-02-04 DIAGNOSIS — K219 Gastro-esophageal reflux disease without esophagitis: Secondary | ICD-10-CM | POA: Diagnosis not present

## 2021-02-04 DIAGNOSIS — M6281 Muscle weakness (generalized): Secondary | ICD-10-CM | POA: Diagnosis not present

## 2021-02-04 DIAGNOSIS — N400 Enlarged prostate without lower urinary tract symptoms: Secondary | ICD-10-CM | POA: Diagnosis not present

## 2021-02-04 DIAGNOSIS — G629 Polyneuropathy, unspecified: Secondary | ICD-10-CM | POA: Diagnosis not present

## 2021-02-04 DIAGNOSIS — I10 Essential (primary) hypertension: Secondary | ICD-10-CM | POA: Diagnosis not present

## 2021-02-07 DIAGNOSIS — R252 Cramp and spasm: Secondary | ICD-10-CM | POA: Diagnosis not present

## 2021-02-07 DIAGNOSIS — G629 Polyneuropathy, unspecified: Secondary | ICD-10-CM | POA: Diagnosis not present

## 2021-02-07 DIAGNOSIS — M4800 Spinal stenosis, site unspecified: Secondary | ICD-10-CM | POA: Diagnosis not present

## 2021-02-07 DIAGNOSIS — M6281 Muscle weakness (generalized): Secondary | ICD-10-CM | POA: Diagnosis not present

## 2021-02-07 DIAGNOSIS — I10 Essential (primary) hypertension: Secondary | ICD-10-CM | POA: Diagnosis not present

## 2021-02-14 DIAGNOSIS — I10 Essential (primary) hypertension: Secondary | ICD-10-CM | POA: Diagnosis not present

## 2021-02-14 DIAGNOSIS — L89619 Pressure ulcer of right heel, unspecified stage: Secondary | ICD-10-CM | POA: Diagnosis not present

## 2021-02-14 DIAGNOSIS — R252 Cramp and spasm: Secondary | ICD-10-CM | POA: Diagnosis not present

## 2021-02-14 DIAGNOSIS — M6281 Muscle weakness (generalized): Secondary | ICD-10-CM | POA: Diagnosis not present

## 2021-02-15 DIAGNOSIS — I10 Essential (primary) hypertension: Secondary | ICD-10-CM | POA: Diagnosis not present

## 2021-02-15 DIAGNOSIS — M4800 Spinal stenosis, site unspecified: Secondary | ICD-10-CM | POA: Diagnosis not present

## 2021-02-15 DIAGNOSIS — R339 Retention of urine, unspecified: Secondary | ICD-10-CM | POA: Diagnosis not present

## 2021-02-15 DIAGNOSIS — E1165 Type 2 diabetes mellitus with hyperglycemia: Secondary | ICD-10-CM | POA: Diagnosis not present

## 2021-02-16 DIAGNOSIS — L89614 Pressure ulcer of right heel, stage 4: Secondary | ICD-10-CM | POA: Diagnosis not present

## 2021-02-17 DIAGNOSIS — M4800 Spinal stenosis, site unspecified: Secondary | ICD-10-CM | POA: Diagnosis not present

## 2021-02-17 DIAGNOSIS — I82401 Acute embolism and thrombosis of unspecified deep veins of right lower extremity: Secondary | ICD-10-CM | POA: Diagnosis not present

## 2021-02-17 DIAGNOSIS — I10 Essential (primary) hypertension: Secondary | ICD-10-CM | POA: Diagnosis not present

## 2021-02-17 DIAGNOSIS — M6281 Muscle weakness (generalized): Secondary | ICD-10-CM | POA: Diagnosis not present

## 2021-02-17 DIAGNOSIS — G629 Polyneuropathy, unspecified: Secondary | ICD-10-CM | POA: Diagnosis not present

## 2021-02-17 DIAGNOSIS — R52 Pain, unspecified: Secondary | ICD-10-CM | POA: Diagnosis not present

## 2021-02-17 DIAGNOSIS — R252 Cramp and spasm: Secondary | ICD-10-CM | POA: Diagnosis not present

## 2021-02-19 DIAGNOSIS — I10 Essential (primary) hypertension: Secondary | ICD-10-CM | POA: Diagnosis not present

## 2021-02-19 DIAGNOSIS — H548 Legal blindness, as defined in USA: Secondary | ICD-10-CM | POA: Diagnosis not present

## 2021-02-19 DIAGNOSIS — M6281 Muscle weakness (generalized): Secondary | ICD-10-CM | POA: Diagnosis not present

## 2021-02-19 DIAGNOSIS — N400 Enlarged prostate without lower urinary tract symptoms: Secondary | ICD-10-CM | POA: Diagnosis not present

## 2021-02-19 DIAGNOSIS — G629 Polyneuropathy, unspecified: Secondary | ICD-10-CM | POA: Diagnosis not present

## 2021-02-19 DIAGNOSIS — K219 Gastro-esophageal reflux disease without esophagitis: Secondary | ICD-10-CM | POA: Diagnosis not present

## 2021-02-22 DIAGNOSIS — I82401 Acute embolism and thrombosis of unspecified deep veins of right lower extremity: Secondary | ICD-10-CM | POA: Diagnosis not present

## 2021-02-22 DIAGNOSIS — I10 Essential (primary) hypertension: Secondary | ICD-10-CM | POA: Diagnosis not present

## 2021-02-22 DIAGNOSIS — M4800 Spinal stenosis, site unspecified: Secondary | ICD-10-CM | POA: Diagnosis not present

## 2021-02-22 DIAGNOSIS — L89619 Pressure ulcer of right heel, unspecified stage: Secondary | ICD-10-CM | POA: Diagnosis not present

## 2021-02-23 DIAGNOSIS — L89614 Pressure ulcer of right heel, stage 4: Secondary | ICD-10-CM | POA: Diagnosis not present

## 2021-02-28 ENCOUNTER — Ambulatory Visit (INDEPENDENT_AMBULATORY_CARE_PROVIDER_SITE_OTHER): Payer: Medicare HMO | Admitting: Podiatry

## 2021-02-28 ENCOUNTER — Other Ambulatory Visit: Payer: Self-pay

## 2021-02-28 DIAGNOSIS — E119 Type 2 diabetes mellitus without complications: Secondary | ICD-10-CM

## 2021-02-28 DIAGNOSIS — E1142 Type 2 diabetes mellitus with diabetic polyneuropathy: Secondary | ICD-10-CM

## 2021-02-28 DIAGNOSIS — M2142 Flat foot [pes planus] (acquired), left foot: Secondary | ICD-10-CM | POA: Diagnosis not present

## 2021-02-28 DIAGNOSIS — M79675 Pain in left toe(s): Secondary | ICD-10-CM

## 2021-02-28 DIAGNOSIS — M79674 Pain in right toe(s): Secondary | ICD-10-CM

## 2021-02-28 DIAGNOSIS — M2141 Flat foot [pes planus] (acquired), right foot: Secondary | ICD-10-CM | POA: Diagnosis not present

## 2021-02-28 DIAGNOSIS — B351 Tinea unguium: Secondary | ICD-10-CM | POA: Diagnosis not present

## 2021-02-28 DIAGNOSIS — L89619 Pressure ulcer of right heel, unspecified stage: Secondary | ICD-10-CM | POA: Diagnosis not present

## 2021-02-28 NOTE — Progress Notes (Signed)
ANNUAL DIABETIC FOOT EXAM  Subjective: Michael Jensen presents today for for annual diabetic foot examination, at risk foot care with history of diabetic neuropathy, and painful elongated mycotic toenails 1-5 bilaterally which are tender when wearing enclosed shoe gear. Pain is relieved with periodic professional debridement..  Patient relates several year h/o diabetes.  Patient endorses h/o foot wound and currently has a pressure ulcer of the right heel. It is being managed by Wound Care Team at [facility].   Patient denies any numbness, tingling, burning, or pins/needle sensation in feet.  Patient has been diagnosed with neuropathy.  He cannot remember his fingerstick reading from this morning.  Soundra Pilon, FNP is patient's PCP. Last visit was 01/25/2021.  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)    BPH (benign prostatic hyperplasia)    GERD (gastroesophageal reflux disease)    Glaucoma    HTN (hypertension)    Hyperlipidemia    OA (osteoarthritis)    Patient Active Problem List   Diagnosis Date Noted   Cognitive impairment 03/01/2021   Legal blindness, as defined in [place] 03/01/2021   Recurrent falls 03/01/2021   Chronic indwelling Foley catheter 03/01/2021   Acute urinary retention 11/25/2020   Spinal stenosis of lumbar region at multiple levels 11/25/2020   Spinal stenosis, cervicothoracic region 11/25/2020   Benign prostatic hyperplasia without lower urinary tract symptoms 03/04/2020   Diabetic peripheral neuropathy associated with type 2 diabetes mellitus (HCC) 03/04/2020   Essential hypertension 03/04/2020   Gastroesophageal reflux disease without esophagitis 03/04/2020   Glaucoma 03/04/2020   Mixed hyperlipidemia 03/04/2020   Osteoarthritis of knee 03/04/2020   Thrombocytopenia (HCC) 03/04/2020   Past Surgical History:  Procedure Laterality Date   ANTERIOR CERVICAL DECOMP/DISCECTOMY  FUSION N/A 11/28/2020   Procedure: ANTERIOR CERVICAL DECOMPRESSION/DISCECTOMY FUSION  CERVICAL SEVEN(C7)-THORACIC ONE(T1);  Surgeon: Jadene Pierini, MD;  Location: [facility] OR;  Service: Neurosurgery;  Laterality: N/A;   HERNIA REPAIR     Current Outpatient Medications on File Prior to Visit  Medication Sig Dispense Refill   acetaminophen (TYLENOL) 500 MG tablet Take 1,000 mg by mouth every 6 (six) hours as needed for mild pain or headache.     albuterol (VENTOLIN HFA) 108 (90 Base) MCG/ACT inhaler 2 puffs as needed (Patient not taking: No sig reported)     Ascorbic Acid (VITAMIN C) 1000 MG tablet Take 1,000 mg by mouth daily.     aspirin EC 81 MG tablet Take 1 tablet (81 mg total) by mouth daily. (Patient not taking: No sig reported) 90 tablet 3   atorvastatin (LIPITOR) 40 MG tablet Take 40 mg by mouth daily.     brimonidine (ALPHAGAN) 0.2 % ophthalmic solution Place 1 drop into both eyes 2 (two) times daily.  5   dorzolamide (TRUSOPT) 2 % ophthalmic solution Place 1 drop into both eyes 2 (two) times daily.  5   insulin aspart (NOVOLOG) 100 UNIT/ML injection 0-15 Units, Subcutaneous, 3 times daily with meals CBG < 70: Implement Hypoglycemia measures CBG 70 - 120: 0 units CBG 121 - 150: 2 units CBG 151 - 200: 3 units CBG 201 - 250: 5 units CBG 251 - 300: 8 units CBG 301 - 350: 11 units CBG 351 - 400: 15 units CBG > 400: call MD 10 mL 11   latanoprost (XALATAN) 0.005 % ophthalmic solution Place 1 drop into the right eye at bedtime. 2.5 mL 12  lisinopril (ZESTRIL) 10 MG tablet Take 10 mg by mouth daily.     metFORMIN (GLUCOPHAGE) 500 MG tablet Take 500 mg by mouth 2 (two) times daily with a meal.     metoprolol tartrate (LOPRESSOR) 25 MG tablet Take 25 mg by mouth 2 (two) times daily with a meal.     Multiple Vitamins-Minerals (CENTRUM SILVER) tablet Take 1 tablet by mouth daily with breakfast.     pantoprazole (PROTONIX) 40 MG tablet Take 40 mg by mouth daily before breakfast.     tamsulosin  (FLOMAX) 0.4 MG CAPS capsule Take 1 capsule (0.4 mg total) by mouth in the morning and at bedtime. 30 capsule    No current facility-administered medications on file prior to visit.    No Known Allergies Social History   Occupational History   Not on file  Tobacco Use   Smoking status: Former   Smokeless tobacco: Never  Substance and Sexual Activity   Alcohol use: Not on file   Drug use: Never   Sexual activity: Not on file   History reviewed. No pertinent family history. Immunization History  Administered Date(s) Administered   PFIZER(Purple Top)SARS-COV-2 Vaccination 05/19/2019, 06/27/2019     Review of Systems: Negative except as noted in the HPI.   Objective: There were no vitals filed for this visit.  [place] Jensen is a pleasant 85 y.o. male in NAD. AAO X 3.  Vascular Examination: CFT <3 seconds b/l LE. Palpable pedal pulses b/l LE. Pedal hair absent. No pain with calf compression b/l. Dependent edema noted b/l LE. No cyanosis or clubbing noted b/l LE.  Dermatological Examination: Pedal skin is warm and supple b/l LE. No interdigital macerations noted b/l LE. Toenails 1-5 right, L hallux, L 2nd toe, L 3rd toe, and L 5th toe elongated, discolored, dystrophic, thickened, and crumbly with subungual debris and tenderness to dorsal palpation. Anonychia noted L 4th toe. Nailbed(s) epithelialized.  Dressing noted right heel which is clean, dry and intact. Wearing sky blue heel protector on right foot for pressure relief.  Musculoskeletal Examination: Noted disuse atrophy bilaterally. No pain, crepitus or joint limitation noted with ROM bilateral LE. Pes planus deformity noted bilateral LE. Utilizes wheelchair for mobility assistance.  Footwear Assessment: Does the patient wear appropriate shoes? Yes. Does the patient need inserts/orthotics? Yes.  Neurological Examination: Protective sensation diminished with 10g monofilament b/l.  Hemoglobin A1C Latest Ref Rng & Units  11/25/2020  HGBA1C 4.8 - 5.6 % 6.3(H)  Some recent data might be hidden   Assessment: 1. Pain due to onychomycosis of toenails of both feet   2. Pressure injury of skin of right heel, unspecified injury stage   3. Pes planus of both feet   4. Diabetic peripheral neuropathy associated with type 2 diabetes mellitus (HCC)   5. Encounter for diabetic foot exam (HCC)      ADA Risk Categorization: High Risk  Patient has one or more of the following: Loss of protective sensation Absent pedal pulses Severe Foot deformity History of foot ulcer  Plan: -Diabetic foot examination performed today. -Continue foot and shoe inspections daily. Monitor blood glucose per PCP/Endocrinologist's recommendations. -Mycotic toenails 1-5 right, L hallux, L 2nd toe, L 3rd toe, and L 5th toe were debrided in length and girth with sterile nail nippers and dremel without iatrogenic bleeding. -Continue pressure precautions as well as fall precautions. -Patient/POA to call should there be question/concern in the interim.  Return in about 3 months (around 05/29/2021).  [name], DPM

## 2021-03-01 ENCOUNTER — Emergency Department (HOSPITAL_COMMUNITY): Payer: Medicare HMO

## 2021-03-01 ENCOUNTER — Emergency Department (HOSPITAL_COMMUNITY)
Admission: EM | Admit: 2021-03-01 | Discharge: 2021-03-01 | Disposition: A | Discharge status: Skilled Nursing Facility | Payer: Medicare HMO | Attending: Emergency Medicine | Admitting: Emergency Medicine

## 2021-03-01 DIAGNOSIS — E1142 Type 2 diabetes mellitus with diabetic polyneuropathy: Secondary | ICD-10-CM

## 2021-03-01 DIAGNOSIS — Z794 Long term (current) use of insulin: Secondary | ICD-10-CM | POA: Insufficient documentation

## 2021-03-01 DIAGNOSIS — Z20822 Contact with and (suspected) exposure to covid-19: Secondary | ICD-10-CM | POA: Insufficient documentation

## 2021-03-01 DIAGNOSIS — K219 Gastro-esophageal reflux disease without esophagitis: Secondary | ICD-10-CM | POA: Diagnosis not present

## 2021-03-01 DIAGNOSIS — Z7982 Long term (current) use of aspirin: Secondary | ICD-10-CM | POA: Insufficient documentation

## 2021-03-01 DIAGNOSIS — R339 Retention of urine, unspecified: Secondary | ICD-10-CM | POA: Diagnosis not present

## 2021-03-01 DIAGNOSIS — R410 Disorientation, unspecified: Secondary | ICD-10-CM | POA: Diagnosis not present

## 2021-03-01 DIAGNOSIS — G629 Polyneuropathy, unspecified: Secondary | ICD-10-CM | POA: Diagnosis not present

## 2021-03-01 DIAGNOSIS — F22 Delusional disorders: Secondary | ICD-10-CM | POA: Diagnosis not present

## 2021-03-01 DIAGNOSIS — H548 Legal blindness, as defined in USA: Secondary | ICD-10-CM | POA: Insufficient documentation

## 2021-03-01 DIAGNOSIS — R791 Abnormal coagulation profile: Secondary | ICD-10-CM | POA: Diagnosis not present

## 2021-03-01 DIAGNOSIS — R4189 Other symptoms and signs involving cognitive functions and awareness: Secondary | ICD-10-CM | POA: Diagnosis not present

## 2021-03-01 DIAGNOSIS — I10 Essential (primary) hypertension: Secondary | ICD-10-CM | POA: Insufficient documentation

## 2021-03-01 DIAGNOSIS — R000 Tachycardia, unspecified: Secondary | ICD-10-CM | POA: Diagnosis not present

## 2021-03-01 DIAGNOSIS — Z7984 Long term (current) use of oral hypoglycemic drugs: Secondary | ICD-10-CM | POA: Insufficient documentation

## 2021-03-01 DIAGNOSIS — R4182 Altered mental status, unspecified: Secondary | ICD-10-CM | POA: Diagnosis not present

## 2021-03-01 DIAGNOSIS — R296 Repeated falls: Secondary | ICD-10-CM | POA: Insufficient documentation

## 2021-03-01 DIAGNOSIS — N400 Enlarged prostate without lower urinary tract symptoms: Secondary | ICD-10-CM

## 2021-03-01 DIAGNOSIS — Z978 Presence of other specified devices: Secondary | ICD-10-CM | POA: Diagnosis not present

## 2021-03-01 DIAGNOSIS — M4803 Spinal stenosis, cervicothoracic region: Secondary | ICD-10-CM | POA: Diagnosis present

## 2021-03-01 DIAGNOSIS — M6281 Muscle weakness (generalized): Secondary | ICD-10-CM | POA: Diagnosis not present

## 2021-03-01 DIAGNOSIS — Z79899 Other long term (current) drug therapy: Secondary | ICD-10-CM | POA: Insufficient documentation

## 2021-03-01 DIAGNOSIS — Z87891 Personal history of nicotine dependence: Secondary | ICD-10-CM | POA: Insufficient documentation

## 2021-03-01 DIAGNOSIS — Z0389 Encounter for observation for other suspected diseases and conditions ruled out: Secondary | ICD-10-CM | POA: Diagnosis not present

## 2021-03-01 LAB — CBC WITH DIFFERENTIAL/PLATELET
Abs Immature Granulocytes: 0.02 10*3/uL (ref 0.00–0.07)
Basophils Absolute: 0 10*3/uL (ref 0.0–0.1)
Basophils Relative: 0 %
Eosinophils Absolute: 0.1 10*3/uL (ref 0.0–0.5)
Eosinophils Relative: 1 %
HCT: 38.6 % — ABNORMAL LOW (ref 39.0–52.0)
Hemoglobin: 12.3 g/dL — ABNORMAL LOW (ref 13.0–17.0)
Immature Granulocytes: 0 %
Lymphocytes Relative: 22 %
Lymphs Abs: 1.2 10*3/uL (ref 0.7–4.0)
MCH: 26.9 pg (ref 26.0–34.0)
MCHC: 31.9 g/dL (ref 30.0–36.0)
MCV: 84.5 fL (ref 80.0–100.0)
Monocytes Absolute: 0.5 10*3/uL (ref 0.1–1.0)
Monocytes Relative: 8 %
Neutro Abs: 3.9 10*3/uL (ref 1.7–7.7)
Neutrophils Relative %: 69 %
Platelets: 149 10*3/uL — ABNORMAL LOW (ref 150–400)
RBC: 4.57 MIL/uL (ref 4.22–5.81)
RDW: 15.6 % — ABNORMAL HIGH (ref 11.5–15.5)
WBC: 5.7 10*3/uL (ref 4.0–10.5)
nRBC: 0 % (ref 0.0–0.2)

## 2021-03-01 LAB — I-STAT VENOUS BLOOD GAS, ED
Acid-base deficit: 1 mmol/L (ref 0.0–2.0)
Bicarbonate: 20.9 mmol/L (ref 20.0–28.0)
Calcium, Ion: 1.11 mmol/L — ABNORMAL LOW (ref 1.15–1.40)
HCT: 36 % — ABNORMAL LOW (ref 39.0–52.0)
Hemoglobin: 12.2 g/dL — ABNORMAL LOW (ref 13.0–17.0)
O2 Saturation: 99 %
Potassium: 3.9 mmol/L (ref 3.5–5.1)
Sodium: 139 mmol/L (ref 135–145)
TCO2: 22 mmol/L (ref 22–32)
pCO2, Ven: 27.6 mmHg — ABNORMAL LOW (ref 44.0–60.0)
pH, Ven: 7.488 — ABNORMAL HIGH (ref 7.250–7.430)
pO2, Ven: 124 mmHg — ABNORMAL HIGH (ref 32.0–45.0)

## 2021-03-01 LAB — URINALYSIS, ROUTINE W REFLEX MICROSCOPIC
Bilirubin Urine: NEGATIVE
Glucose, UA: NEGATIVE mg/dL
Ketones, ur: NEGATIVE mg/dL
Leukocytes,Ua: NEGATIVE
Nitrite: NEGATIVE
Protein, ur: NEGATIVE mg/dL
Specific Gravity, Urine: 1.01 (ref 1.005–1.030)
pH: 5.5 (ref 5.0–8.0)

## 2021-03-01 LAB — COMPREHENSIVE METABOLIC PANEL
ALT: 15 U/L (ref 0–44)
AST: 29 U/L (ref 15–41)
Albumin: 3.1 g/dL — ABNORMAL LOW (ref 3.5–5.0)
Alkaline Phosphatase: 83 U/L (ref 38–126)
Anion gap: 11 (ref 5–15)
BUN: 10 mg/dL (ref 8–23)
CO2: 18 mmol/L — ABNORMAL LOW (ref 22–32)
Calcium: 8.9 mg/dL (ref 8.9–10.3)
Chloride: 106 mmol/L (ref 98–111)
Creatinine, Ser: 0.68 mg/dL (ref 0.61–1.24)
GFR, Estimated: >60 mL/min (ref >60)
Glucose, Bld: 122 mg/dL — ABNORMAL HIGH (ref 70–99)
Potassium: 4.2 mmol/L (ref 3.5–5.1)
Sodium: 135 mmol/L (ref 135–145)
Total Bilirubin: 0.8 mg/dL (ref 0.3–1.2)
Total Protein: 6.1 g/dL — ABNORMAL LOW (ref 6.5–8.1)

## 2021-03-01 LAB — URINALYSIS, MICROSCOPIC (REFLEX)
Bacteria, UA: NONE SEEN
Squamous Epithelial / HPF: NONE SEEN (ref 0–5)

## 2021-03-01 LAB — PROTIME-INR
INR: 1.4 — ABNORMAL HIGH (ref 0.8–1.2)
Prothrombin Time: 17.5 seconds — ABNORMAL HIGH (ref 11.4–15.2)

## 2021-03-01 LAB — I-STAT CHEM 8, ED
BUN: 10 mg/dL (ref 8–23)
Calcium, Ion: 1.12 mmol/L — ABNORMAL LOW (ref 1.15–1.40)
Chloride: 107 mmol/L (ref 98–111)
Creatinine, Ser: 0.5 mg/dL — ABNORMAL LOW (ref 0.61–1.24)
Glucose, Bld: 126 mg/dL — ABNORMAL HIGH (ref 70–99)
HCT: 37 % — ABNORMAL LOW (ref 39.0–52.0)
Hemoglobin: 12.6 g/dL — ABNORMAL LOW (ref 13.0–17.0)
Potassium: 3.8 mmol/L (ref 3.5–5.1)
Sodium: 138 mmol/L (ref 135–145)
TCO2: 21 mmol/L — ABNORMAL LOW (ref 22–32)

## 2021-03-01 LAB — LACTIC ACID, PLASMA
Lactic Acid, Venous: 3.1 mmol/L (ref 0.5–1.9)
Lactic Acid, Venous: 1.8 mmol/L (ref 0.5–1.9)

## 2021-03-01 LAB — RPR: RPR Ser Ql: NONREACTIVE

## 2021-03-01 LAB — APTT: aPTT: 31 seconds (ref 24–36)

## 2021-03-01 LAB — RESP PANEL BY RT-PCR (FLU A&B, COVID) ARPGX2
Influenza A by PCR: NEGATIVE
Influenza B by PCR: NEGATIVE
SARS Coronavirus 2 by RT PCR: NEGATIVE

## 2021-03-01 IMAGING — CT CT HEAD W/O CM
4 of 5 series · 14 of 37 positions shown, 16 images · non-contrast
Comparison: No priors.

CLINICAL DATA: 85-year-old male with history of mental status
change. Confusion.

EXAM:
CT HEAD WITHOUT CONTRAST
TECHNIQUE: Contiguous axial images were obtained from the base of the skull
through the vertex without intravenous contrast.

[Series 3: head wo · axial · 0.45mm/px · z∈[-43,+12]mm · 2 of 34 slices shown]
[im 12/34  brain]
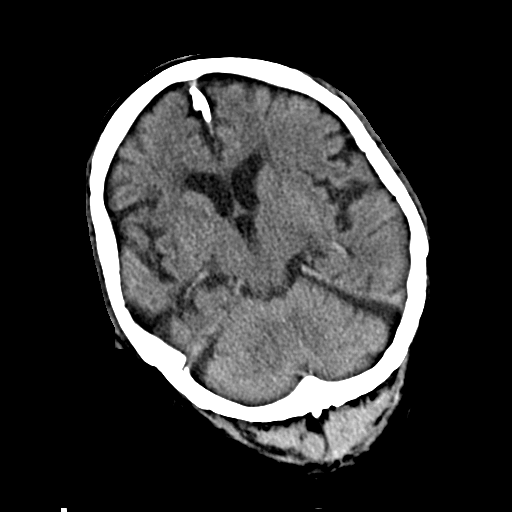
[im 23/34  brain]
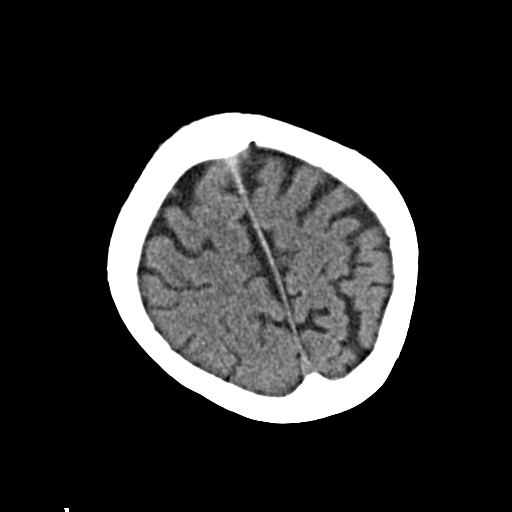

[Series 6: sag soft · sagittal · 0.41mm/px · 3 of 60 slices shown]
[im 20/60  brain]
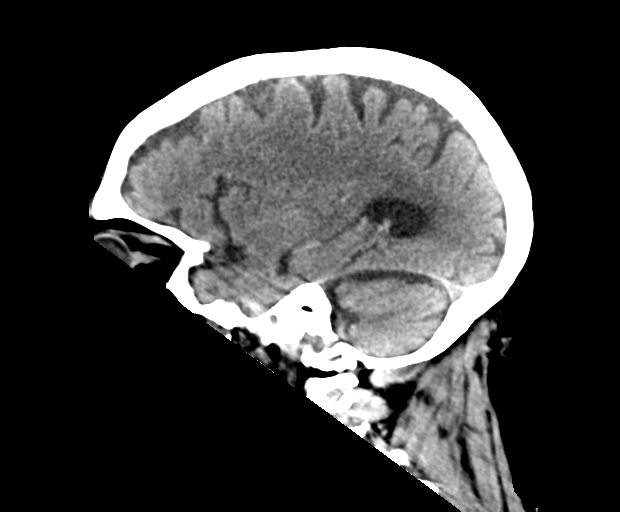
[im 30/60  brain]
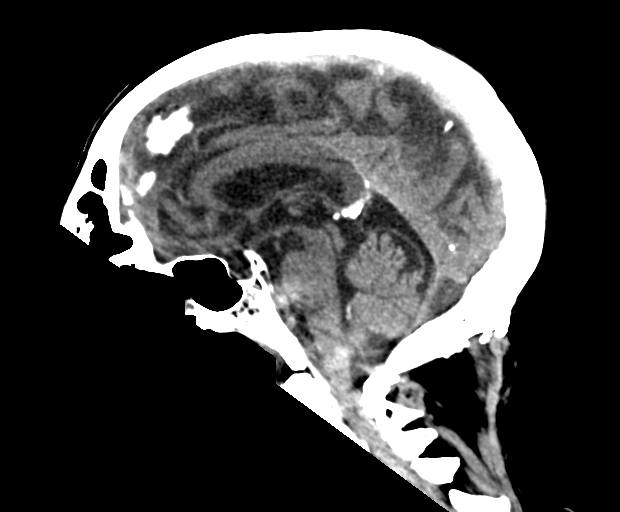
[im 40/60  brain]
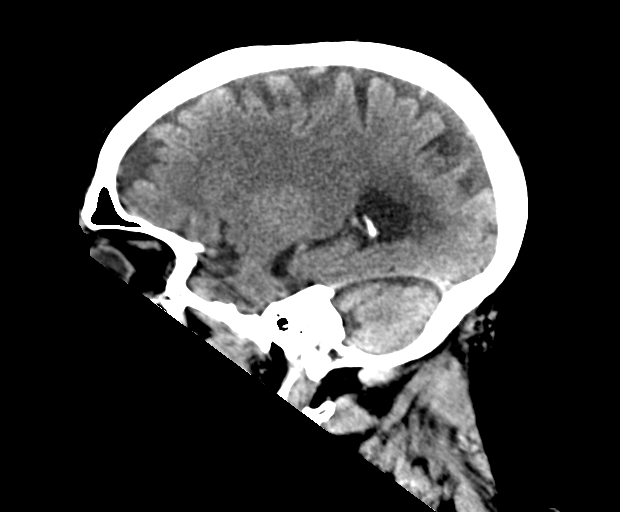

[Series 7: ax head wo · axial · 0.37mm/px · z∈[-187,-58]mm · 5 of 49 slices shown, 7 images]
[im 9/49  brain]
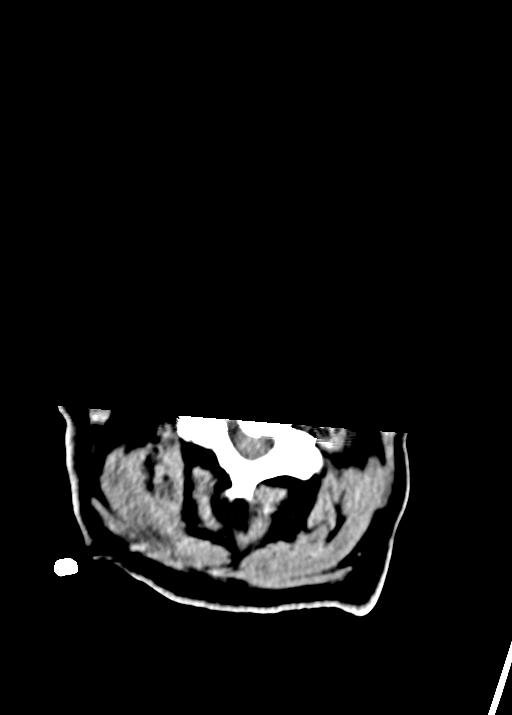
[im 9/49  bone]
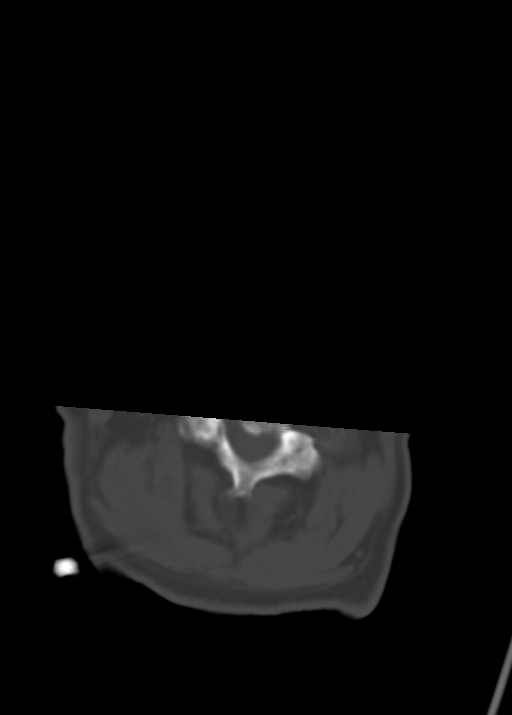
[im 17/49  brain]
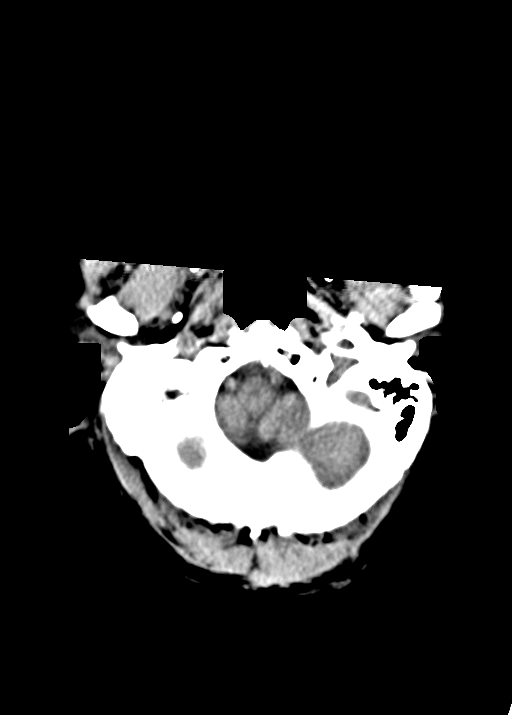
[im 25/49  brain]
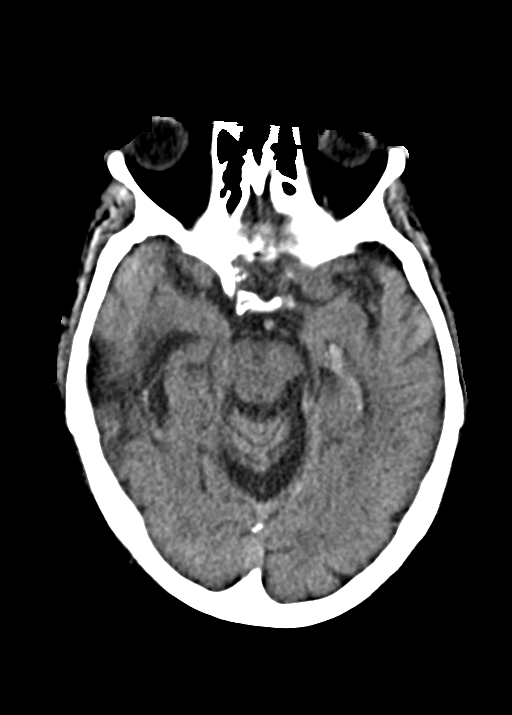
[im 33/49  brain]
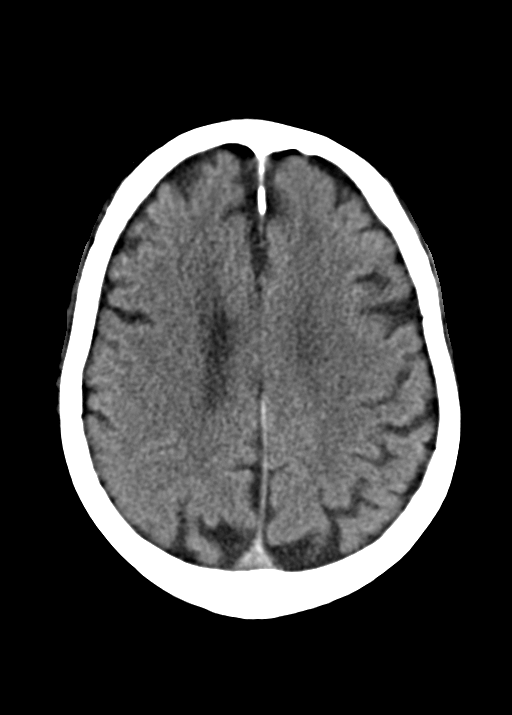
[im 41/49  brain]
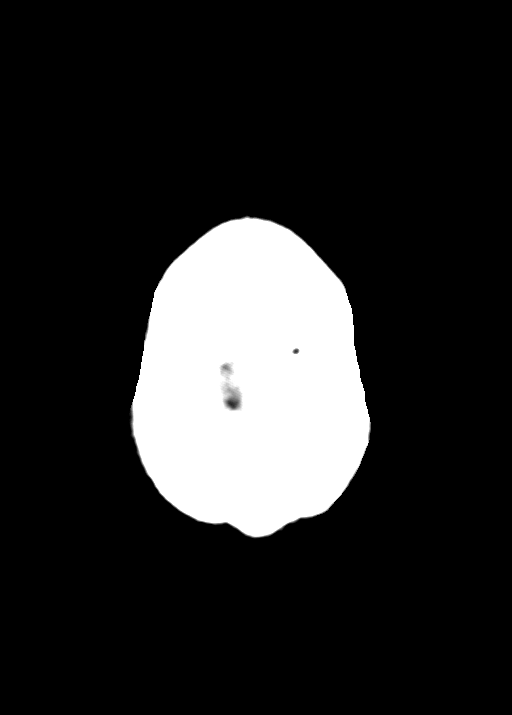
[im 41/49  bone]
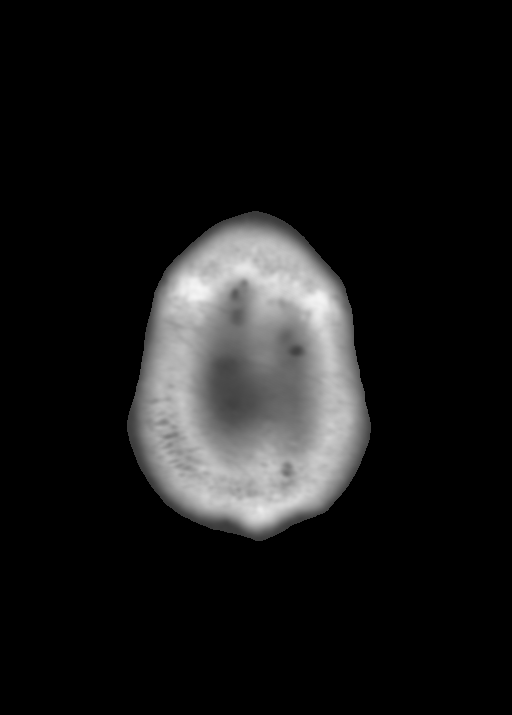

[Series 8: ax head bone · axial · 0.41mm/px · z∈[-174,-78]mm · 4 of 49 slices shown]
[im 9/49  bone]
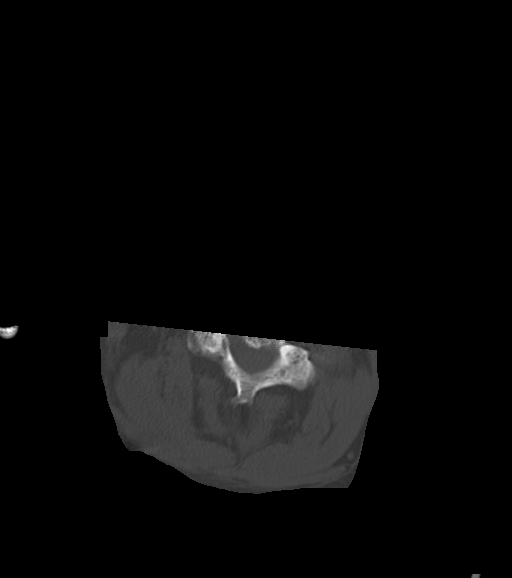
[im 17/49  bone]
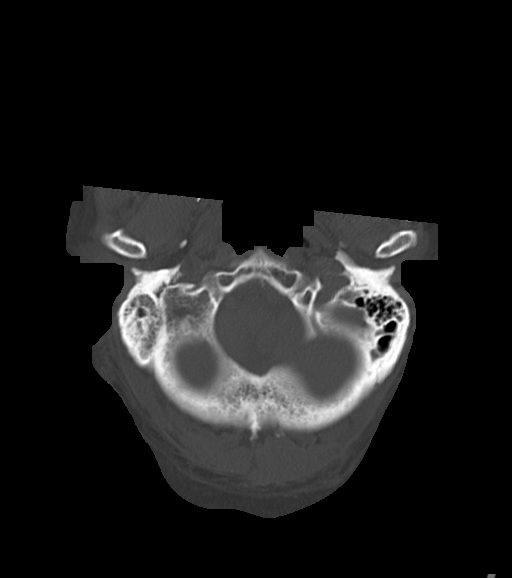
[im 25/49  bone]
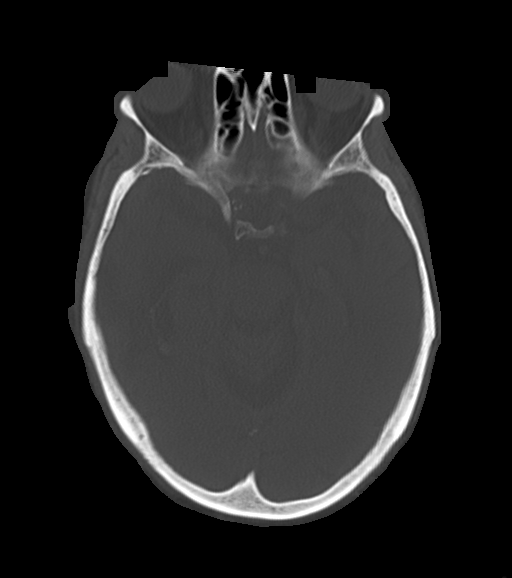
[im 33/49  bone]
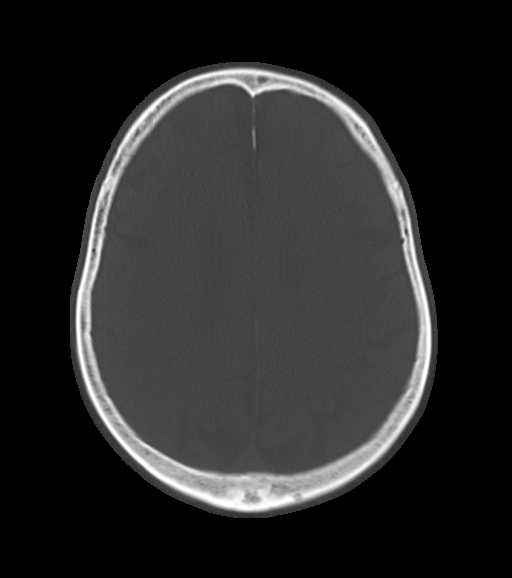

[14 of 37 positions shown; findings below may reference images not displayed]

FINDINGS: Brain: Moderate cerebral and cerebellar atrophy. Patchy and
confluent areas of decreased attenuation are noted throughout the
deep and periventricular white matter of the cerebral hemispheres
bilaterally, compatible with chronic microvascular ischemic disease.
Physiologic calcifications in the left basal ganglia incidentally
noted. No evidence of acute infarction, hemorrhage, hydrocephalus,
extra-axial collection or mass lesion/mass effect.

Vascular: No hyperdense vessel or unexpected calcification.

Skull: Normal. Negative for fracture or focal lesion.

Sinuses/Orbits: No acute finding.

Other: None.
IMPRESSION: 1. No acute intracranial abnormalities.
2. Moderate cerebral and cerebellar atrophy with chronic
microvascular ischemic changes in the cerebral white matter, as
above.

## 2021-03-01 IMAGING — DX DG CHEST 1V PORT
1 series · 1 of 1 positions shown · non-contrast
Comparison: None.

CLINICAL DATA: Questionable sepsis

EXAM:
PORTABLE CHEST 1 VIEW

[chest]
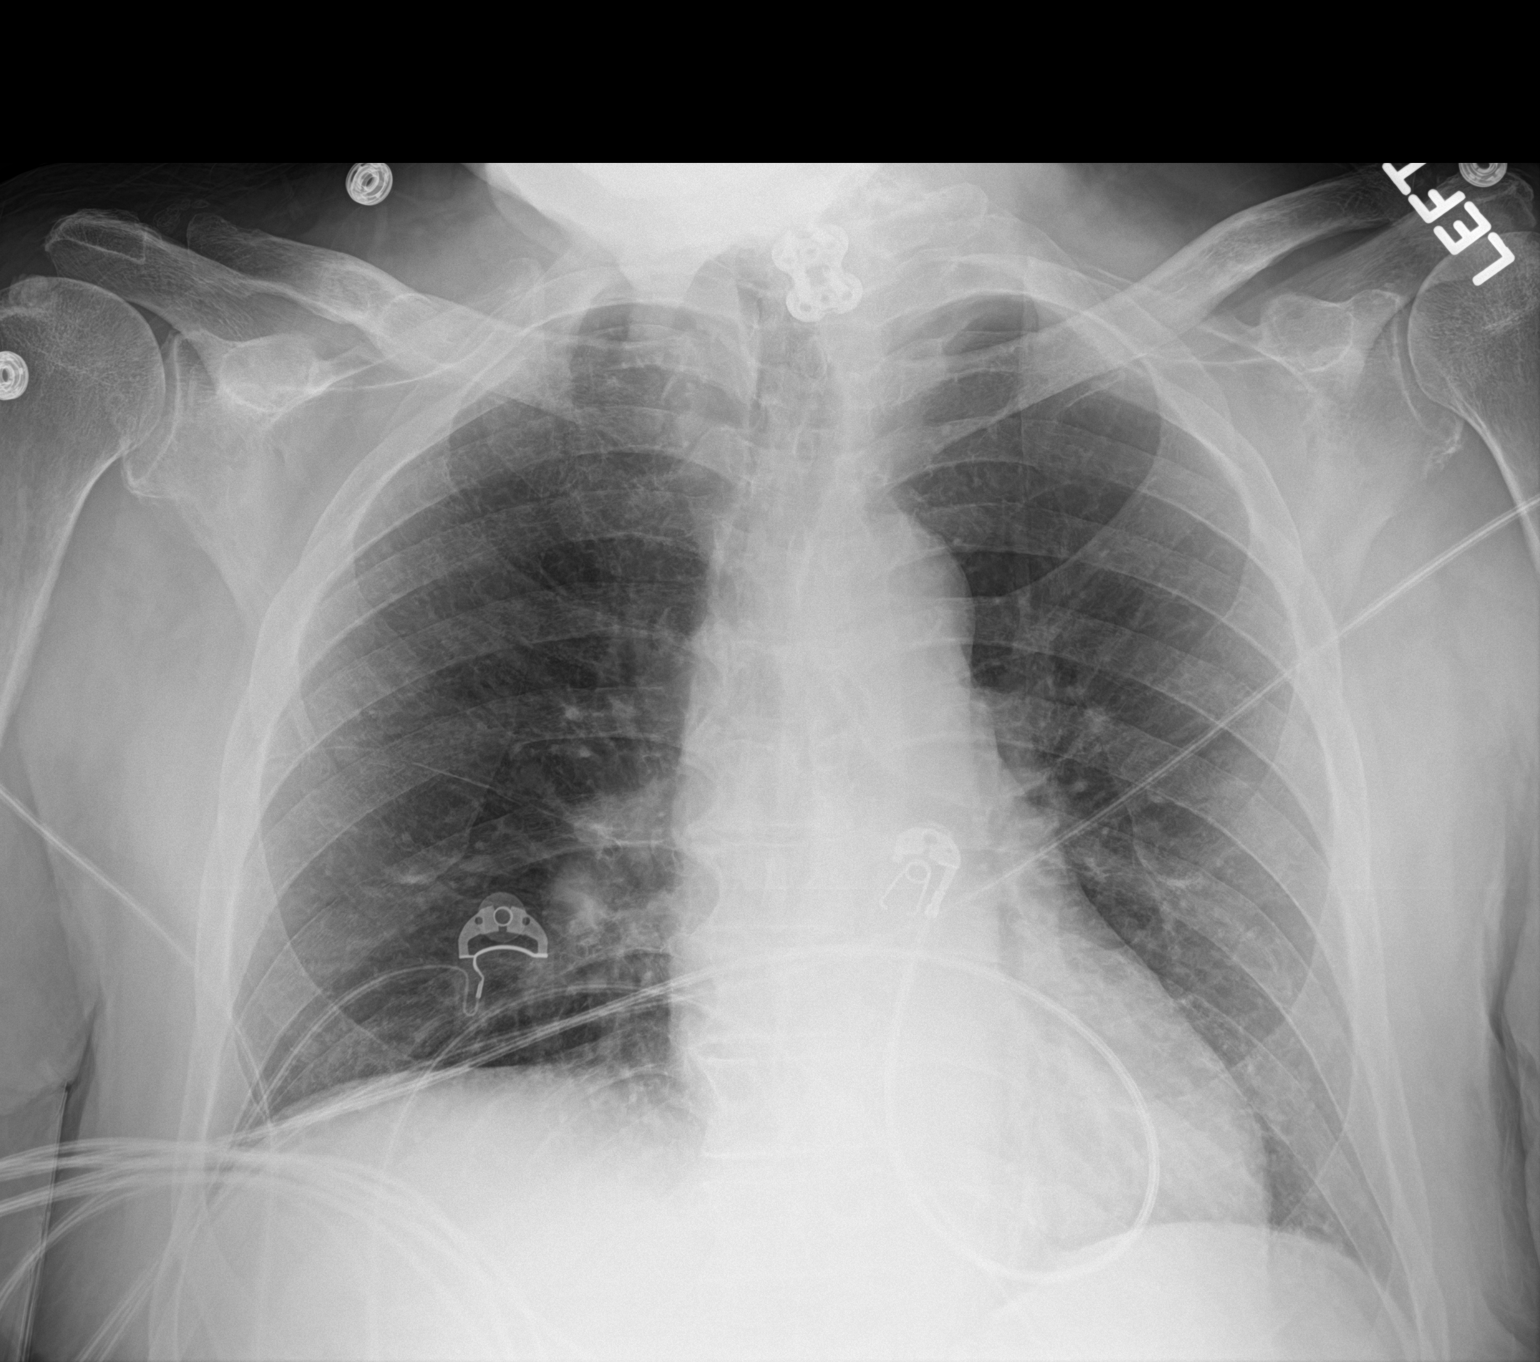

[1 of 1 positions shown; findings below may reference images not displayed]

FINDINGS: Cardiac and mediastinal contours are within normal limits. No focal
pulmonary opacity. No pleural effusion or pneumothorax. No acute
osseous abnormality. Prior ACDF.
IMPRESSION: No acute cardiopulmonary process.

## 2021-03-01 MED ORDER — SODIUM CHLORIDE 0.9 % IV SOLN: INTRAVENOUS | Status: DC

## 2021-03-01 MED ORDER — SODIUM CHLORIDE 0.9 % IV BOLUS (SEPSIS)
1000.0000 mL | Freq: Once | INTRAVENOUS | Status: AC
  Administered 2021-03-01: 02:00:00 1000 mL via INTRAVENOUS

## 2021-03-01 NOTE — ED Notes (Signed)
Attempted report to [facility] x1.  

## 2021-03-01 NOTE — Assessment & Plan Note (Signed)
Chronic. 

## 2021-03-01 NOTE — Consult Note (Signed)
Hospitalist Consultation History and Physical    Tharun Cappella ZOX:[id number] DOB: [birth year] DOA: 03/01/2021  PCP: Soundra Pilon, FNP   Patient coming from: [facility]  I have personally briefly reviewed patient's old medical records in [facility]  Requesting provider: Cardama, MD Reason for consult: AMS CC: altered mental status HPI: 85 year old African-American male with a history of cervical spinal stenosis, inability ambulate secondary to stenosis, chronic indwelling Foley, BPH, hypertension, type 2 diabetes who presents to the ER with altered mental status.  Per the nursing home, the patient is nonambulatory.  He started trying to walk this morning.  He was had a very unsteady gait.  Patient states that he has been living at [facility] since late summer.  This is verified as his date admission to the nursing home was September 2022.  EDP started a work-up on the patient.  He had some mild lactic acidosis that resolved with some gentle IV fluids.  Initially EDP was unable to contact the daughter.  Eventually daughter called the EDP back.  Per the EDP, the daughter reports the patient's had a slow decline over the last several months.  Has been increasingly confused.  No recent fevers, chills.  Patient has an indwelling catheter due to rare urinary retention.  CT head is pending.  I discussed the case with the EDP.  He plans on discharging the patient back to nursing home with the patient CT head is without acute intracranial findings.  Discussed that [facility] is available for repeat consultation and/or admission if needed.   ED Course:  Patient altered on admission.  Initial lactic acidosis 3.1.  Cleared quickly with IV fluids.  UA was negative.  Chest x-ray negative for acute cardiopulmonary disease.  CT head pending  Review of Systems:  Review of Systems  Constitutional: Negative.  Negative for fever and weight loss.  HENT: Negative.  Negative for ear pain, hearing  loss and tinnitus.   Eyes: Negative.  Negative for blurred vision, double vision and photophobia.       Pt is legally blind  Respiratory: Negative.  Negative for cough, hemoptysis and sputum production.   Cardiovascular: Negative.  Negative for chest pain, palpitations and orthopnea.  Gastrointestinal: Negative.  Negative for heartburn, nausea and vomiting.  Genitourinary: Negative.  Negative for dysuria, frequency and urgency.       Pt has chronic foley  Musculoskeletal: Negative.  Negative for back pain, myalgias and neck pain.  Skin: Negative.   Neurological: Negative.  Negative for dizziness, tingling and headaches.       Denies recent falls  Endo/Heme/Allergies: Negative.   Psychiatric/Behavioral: Negative.  Negative for depression, substance abuse and suicidal ideas.   All other systems reviewed and are negative.  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 He has never used smokeless tobacco. He reports that he does not use drugs. No history on  file for alcohol use.  No Known Allergies  No family history on file.  Prior to Admission medications   Medication Sig Start Date End Date Taking? Authorizing Provider  acetaminophen (TYLENOL) 500 MG tablet Take 1,000 mg by mouth every 6 (six) hours as needed for mild pain or headache.    [provider]  albuterol (VENTOLIN HFA) 108 (90 Base) MCG/ACT  inhaler 2 puffs as needed Patient not taking: No sig reported 08/04/19   [provider]  Ascorbic Acid (VITAMIN C) 1000 MG tablet Take 1,000 mg by mouth daily.    [provider]  aspirin EC 81 MG tablet Take 1 tablet (81 mg total) by mouth daily. Patient not taking: No sig reported 08/09/17   Deeann Saint, MD  atorvastatin (LIPITOR) 40 MG tablet Take 40 mg by mouth daily.    [provider]  brimonidine (ALPHAGAN) 0.2 % ophthalmic solution Place 1 drop into both eyes 2 (two) times daily. 12/23/17   [provider]  dorzolamide (TRUSOPT) 2 % ophthalmic solution Place 1 drop into both eyes 2 (two) times daily. 10/22/17   [provider]  insulin aspart (NOVOLOG) 100 UNIT/ML injection 0-15 Units, Subcutaneous, 3 times daily with meals CBG < 70: Implement Hypoglycemia measures CBG 70 - 120: 0 units CBG 121 - 150: 2 units CBG 151 - 200: 3 units CBG 201 - 250: 5 units CBG 251 - 300: 8 units CBG 301 - 350: 11 units CBG 351 - 400: 15 units CBG > 400: call MD 12/01/20   Maretta Bees, MD  latanoprost (XALATAN) 0.005 % ophthalmic solution Place 1 drop into the right eye at bedtime. 12/01/20   Ghimire, Werner Lean, MD  lisinopril (ZESTRIL) 10 MG tablet Take 10 mg by mouth daily.    [provider]  metFORMIN (GLUCOPHAGE) 500 MG tablet Take 500 mg by mouth 2 (two) times daily with a meal.    [provider]  metoprolol tartrate (LOPRESSOR) 25 MG tablet Take 25 mg by mouth 2 (two) times daily with a meal.    [provider]  Multiple Vitamins-Minerals (CENTRUM SILVER) tablet Take 1 tablet by mouth daily with breakfast.    [provider]  pantoprazole (PROTONIX) 40 MG tablet Take 40 mg by mouth daily before breakfast.    [provider]  tamsulosin (FLOMAX) 0.4 MG CAPS capsule Take 1 capsule (0.4 mg total) by mouth in the morning and at bedtime. 12/01/20   Maretta Bees, MD    Physical Exam: Vitals:   03/01/21 0300  03/01/21 0330 03/01/21 0400 03/01/21 0500  BP: 121/76 (!) 117/95 121/74 130/79  Pulse: 99   (!) 104  Resp: 20 20 12 11   Temp:      TempSrc:      SpO2: 99%   100%  Weight:      Height:        Physical Exam Vitals and nursing note reviewed.  Constitutional:      General: He is not in acute distress.    Appearance: Normal appearance. He is not ill-appearing, toxic-appearing or diaphoretic.  HENT:     Head: Normocephalic and atraumatic.     Nose: Nose normal. No rhinorrhea.  Eyes:     General: No scleral icterus. Neck:     Comments: Increased cervical kyphosis. Cardiovascular:     Rate and Rhythm: Normal rate and regular rhythm.     Pulses: Normal pulses.  Pulmonary:     Effort: Pulmonary effort  is normal. No respiratory distress.     Breath sounds: Normal breath sounds. No wheezing or rales.  Abdominal:     General: Abdomen is flat. Bowel sounds are normal. There is no distension.     Tenderness: There is no abdominal tenderness. There is no guarding.  Genitourinary:    Comments: Positive indwelling Foley catheter. Skin:    General: Skin is warm and dry.     Capillary Refill: Capillary refill takes less than 2 seconds.     Comments: Small stage II right heel decubitus ulcer.  Less than 2 cm in diameter.  Patient has a heel boot protector on the right heel.  Wound was dressed with a ABD pad and wrapped with Kerlix.  No drainage on the bandage.  Neurological:     Mental Status: He is alert and oriented to person, place, and time.     Comments: Patient having some auditory hallucinations.  He thought there were other people in the room during the interview.  Overall he was oriented to time (December 2022), he knew he was not in a nursing home.  He thought he was in the hospital but cannot name which one.  He was not oriented to situation as he was not clear why he was in the hospital.  He did not recall that he was trying to walk today when he is considered nonambulatory.  He was  having trouble with historical recall.     Labs on Admission: I have personally reviewed following labs and imaging studies  CBC: Recent Labs  Lab 03/01/21 0033 03/01/21 0224 03/01/21 0225  WBC 5.7  --   --   NEUTROABS 3.9  --   --   HGB 12.3* 12.6* 12.2*  HCT 38.6* 37.0* 36.0*  MCV 84.5  --   --   PLT 149*  --   --    Basic Metabolic Panel: Recent Labs  Lab 03/01/21 0033 03/01/21 0224 03/01/21 0225  NA 135 138 139  K 4.2 3.8 3.9  CL 106 107  --   CO2 18*  --   --   GLUCOSE 122* 126*  --   BUN 10 10  --   CREATININE 0.68 0.50*  --   CALCIUM 8.9  --   --    GFR: Estimated Creatinine Clearance: 70 mL/min (A) (by C-G formula based on SCr of 0.5 mg/dL (L)). Liver Function Tests: Recent Labs  Lab 03/01/21 0033  AST 29  ALT 15  ALKPHOS 83  BILITOT 0.8  PROT 6.1*  ALBUMIN 3.1*   No results for input(s): LIPASE, AMYLASE in the last 168 hours. No results for input(s): AMMONIA in the last 168 hours. Coagulation Profile: Recent Labs  Lab 03/01/21 0033  INR 1.4*   Cardiac Enzymes: No results for input(s): CKTOTAL, CKMB, CKMBINDEX, TROPONINI in the last 168 hours. BNP (last 3 results) No results for input(s): PROBNP in the last 8760 hours. HbA1C: No results for input(s): HGBA1C in the last 72 hours. CBG: No results for input(s): GLUCAP in the last 168 hours. Lipid Profile: No results for input(s): CHOL, HDL, LDLCALC, TRIG, CHOLHDL, LDLDIRECT in the last 72 hours. Thyroid Function Tests: No results for input(s): TSH, T4TOTAL, FREET4, T3FREE, THYROIDAB in the last 72 hours. Anemia Panel: No results for input(s): VITAMINB12, FOLATE, FERRITIN, TIBC, IRON, RETICCTPCT in the last 72 hours. Urine analysis:    Component Value Date/Time   COLORURINE YELLOW 03/01/2021 0439   APPEARANCEUR CLEAR 03/01/2021 0439   LABSPEC 1.010  03/01/2021 0439   PHURINE 5.5 03/01/2021 0439   GLUCOSEU NEGATIVE 03/01/2021 0439   HGBUR MODERATE (A) 03/01/2021 0439   BILIRUBINUR  NEGATIVE 03/01/2021 0439   KETONESUR NEGATIVE 03/01/2021 0439   PROTEINUR NEGATIVE 03/01/2021 0439   NITRITE NEGATIVE 03/01/2021 0439   LEUKOCYTESUR NEGATIVE 03/01/2021 0439    Radiological Exams on Admission: I have personally reviewed images DG Chest Port 1 View  Result Date: 03/01/2021 CLINICAL DATA:  Questionable sepsis EXAM: PORTABLE CHEST 1 VIEW COMPARISON:  None. FINDINGS: Cardiac and mediastinal contours are within normal limits. No focal pulmonary opacity. No pleural effusion or pneumothorax. No acute osseous abnormality. Prior ACDF. IMPRESSION: No acute cardiopulmonary process. Electronically Signed   By: Wiliam Ke M.D.   On: 03/01/2021 01:53    EKG: I have personally reviewed EKG: sinus tachycardia    Assessment/Plan Principal Problem:   Cognitive impairment Active Problems:   Essential hypertension   Spinal stenosis, cervicothoracic region   Chronic indwelling Foley catheter   Benign prostatic hyperplasia without lower urinary tract symptoms   Diabetic peripheral neuropathy associated with type 2 diabetes mellitus (HCC)    Cognitive impairment Appears to be chronic in nature.  Per report, the patient's cognitive decline has been over the last several months.  Recommend CT head.  Also recommend checking RPR for completeness sake.  This does not have to be during his ER visit but can be done at the nursing home in an outpatient setting.  Essential hypertension Stable.  Spinal stenosis, cervicothoracic region Chronic.  Chronic indwelling Foley catheter Chronic.  No evidence of UTI.  Benign prostatic hyperplasia without lower urinary tract symptoms Has an indwelling Foley.  Is on Flomax and nursing home.  Will need another voiding trial with urology.  Diabetic peripheral neuropathy associated with type 2 diabetes mellitus (HCC) Stable.  Per nursing home records, the patient is a DNR.  Carollee Herter, DO [facility] 03/01/2021, 6:00 AM

## 2021-03-01 NOTE — Assessment & Plan Note (Signed)
Stable

## 2021-03-01 NOTE — Assessment & Plan Note (Signed)
Appears to be chronic in nature.  Per report, the patient's cognitive decline has been over the last several months.  Recommend CT head.  Also recommend checking RPR for completeness sake.  This does not have to be during his ER visit but can be done at the nursing home in an outpatient setting.

## 2021-03-01 NOTE — ED Triage Notes (Signed)
Arrive via [facility]. Nursing home staff notes changes in mental status. More agitated and having visual hallucinations. Attempting to get OOB but is not ambulatory

## 2021-03-01 NOTE — ED Notes (Signed)
Attempted report to [facility].

## 2021-03-01 NOTE — ED Provider Notes (Signed)
[facility] EMERGENCY DEPARTMENT Provider Note  CSN: [id number] Arrival date & time: 03/01/21 0002  Chief Complaint(s) Altered Mental Status (Nursing home staff notes changes in mental status. More agitated and having visual hallucinations. Attempting to get OOB but is not ambulatory)  HPI Michael Jensen is a 85 y.o. male who presents from skilled nursing facility for altered mental status and delusions.  No report of baseline mental status.  They are requesting evaluation for possible urinary tract infection.  Patient is alert and oriented x3 but believes he lives at home and brings up having a birthday party yesterday at his home where people would not leave his house and was sleeping on his couch.  Patient has no physical complaints other than right heel pain from a chronic wound being treated.  Remainder of history, ROS, and physical exam limited due to patient's condition (AMS). Additional information was obtained from EMS.   Level V Caveat.  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)    BPH (benign prostatic hyperplasia)    GERD (gastroesophageal reflux disease)    Glaucoma    HTN (hypertension)    Hyperlipidemia    OA (osteoarthritis)    Patient Active Problem List   Diagnosis Date Noted   Cognitive impairment 03/01/2021   Legal blindness, as defined in [place] 03/01/2021   Recurrent falls 03/01/2021   Chronic indwelling Foley catheter 03/01/2021   Acute urinary retention 11/25/2020   Spinal stenosis of lumbar region at multiple levels 11/25/2020   Spinal stenosis, cervicothoracic region 11/25/2020   Benign prostatic hyperplasia without lower urinary tract symptoms 03/04/2020   Diabetic peripheral neuropathy associated with type 2 diabetes mellitus (HCC) 03/04/2020   Essential hypertension 03/04/2020    Gastroesophageal reflux disease without esophagitis 03/04/2020   Glaucoma 03/04/2020   Mixed hyperlipidemia 03/04/2020   Osteoarthritis of knee 03/04/2020   Thrombocytopenia (HCC) 03/04/2020   Home Medication(s) Prior to Admission medications   Medication Sig Start Date End Date Taking? Authorizing Provider  acetaminophen (TYLENOL) 500 MG tablet Take 1,000 mg by mouth every 6 (six) hours as needed for mild pain or headache.    [provider]  albuterol (VENTOLIN HFA) 108 (90 Base) MCG/ACT inhaler 2 puffs as needed Patient not taking: No sig reported 08/04/19   [provider]  Ascorbic Acid (VITAMIN C) 1000 MG tablet Take 1,000 mg by mouth daily.    [provider]  aspirin EC 81 MG tablet Take 1 tablet (81 mg total) by mouth daily. Patient not taking: No sig reported 08/09/17   Deeann Saint, MD  atorvastatin (LIPITOR) 40 MG tablet Take 40 mg by mouth daily.    [provider]  brimonidine (ALPHAGAN) 0.2 % ophthalmic solution Place 1 drop into both eyes 2 (two) times daily. 12/23/17   [provider]  dorzolamide (TRUSOPT) 2 % ophthalmic solution Place 1 drop into both eyes 2 (two) times daily. 10/22/17   [provider]  insulin aspart (NOVOLOG) 100 UNIT/ML injection 0-15 Units, Subcutaneous, 3 times daily with meals CBG < 70: Implement Hypoglycemia measures CBG 70 - 120: 0 units CBG 121 - 150: 2 units CBG 151 - 200: 3 units CBG 201 - 250: 5 units CBG 251 - 300: 8 units CBG 301 - 350: 11 units CBG 351 - 400: 15 units CBG > 400: call MD  12/01/20   Ghimire, Werner Lean, MD  latanoprost (XALATAN) 0.005 % ophthalmic solution Place 1 drop into the right eye at bedtime. 12/01/20   Ghimire, Werner Lean, MD  lisinopril (ZESTRIL) 10 MG tablet Take 10 mg by mouth daily.    [provider]  metFORMIN (GLUCOPHAGE) 500 MG tablet Take 500 mg by mouth 2 (two) times daily with a meal.    [provider]  metoprolol tartrate (LOPRESSOR) 25 MG  tablet Take 25 mg by mouth 2 (two) times daily with a meal.    [provider]  Multiple Vitamins-Minerals (CENTRUM SILVER) tablet Take 1 tablet by mouth daily with breakfast.    [provider]  pantoprazole (PROTONIX) 40 MG tablet Take 40 mg by mouth daily before breakfast.    [provider]  tamsulosin (FLOMAX) 0.4 MG CAPS capsule Take 1 capsule (0.4 mg total) by mouth in the morning and at bedtime. 12/01/20   Maretta Bees, MD                                                                                                                                    Past Surgical History Past Surgical History:  Procedure Laterality Date   ANTERIOR CERVICAL DECOMP/DISCECTOMY FUSION N/A 11/28/2020   Procedure: ANTERIOR CERVICAL DECOMPRESSION/DISCECTOMY FUSION  CERVICAL SEVEN(C7)-THORACIC ONE(T1);  Surgeon: Jadene Pierini, MD;  Location: [facility] OR;  Service: Neurosurgery;  Laterality: N/A;   HERNIA REPAIR     Family History No family history on file.  Social History Social History   Tobacco Use   Smoking status: Former   Smokeless tobacco: Never  Substance Use Topics   Drug use: Never   Allergies Patient has no known allergies.  Review of Systems Review of Systems Unable to obtain due to altered mental status Physical Exam Vital Signs  I have reviewed the triage vital signs BP 131/78    Pulse (!) 120    Temp 98.3 F (36.8 C) (Oral)    Resp 18    Ht 5\' 9"  (1.753 m)    Wt 83.5 kg    SpO2 100%    BMI 27.17 kg/m   Physical Exam Vitals reviewed.  Constitutional:      General: He is not in acute distress.    Appearance: He is well-developed. He is not diaphoretic.  HENT:     Head: Normocephalic and atraumatic.     Nose: Nose normal.  Eyes:     General: No scleral icterus.       Right eye: No discharge.        Left eye: No discharge.     Conjunctiva/sclera: Conjunctivae normal.     Pupils: Pupils are equal, round, and reactive to light.   Cardiovascular:     Rate and Rhythm: Normal rate and regular rhythm.     Heart sounds: No murmur heard.   No friction rub. No gallop.  Pulmonary:  Effort: Pulmonary effort is normal. No respiratory distress.     Breath sounds: Normal breath sounds. No stridor. No rales.  Abdominal:     General: There is no distension.     Palpations: Abdomen is soft.     Tenderness: There is no abdominal tenderness.  Musculoskeletal:        General: No tenderness.     Cervical back: Normal range of motion and neck supple.       Feet:  Skin:    General: Skin is warm and dry.     Findings: No erythema or rash.  Neurological:     Mental Status: He is alert and oriented to person, place, and time. He is confused.    ED Results and Treatments Labs (all labs ordered are listed, but only abnormal results are displayed) Labs Reviewed  LACTIC ACID, PLASMA - Abnormal; Notable for the following components:      Result Value   Lactic Acid, Venous 3.1 (*)    All other components within normal limits  COMPREHENSIVE METABOLIC PANEL - Abnormal; Notable for the following components:   CO2 18 (*)    Glucose, Bld 122 (*)    Total Protein 6.1 (*)    Albumin 3.1 (*)    All other components within normal limits  CBC WITH DIFFERENTIAL/PLATELET - Abnormal; Notable for the following components:   Hemoglobin 12.3 (*)    HCT 38.6 (*)    RDW 15.6 (*)    Platelets 149 (*)    All other components within normal limits  PROTIME-INR - Abnormal; Notable for the following components:   Prothrombin Time 17.5 (*)    INR 1.4 (*)    All other components within normal limits  URINALYSIS, ROUTINE W REFLEX MICROSCOPIC - Abnormal; Notable for the following components:   Hgb urine dipstick MODERATE (*)    All other components within normal limits  I-STAT CHEM 8, ED - Abnormal; Notable for the following components:   Creatinine, Ser 0.50 (*)    Glucose, Bld 126 (*)    Calcium, Ion 1.12 (*)    TCO2 21 (*)    Hemoglobin  12.6 (*)    HCT 37.0 (*)    All other components within normal limits  I-STAT VENOUS BLOOD GAS, ED - Abnormal; Notable for the following components:   pH, Ven 7.488 (*)    pCO2, Ven 27.6 (*)    pO2, Ven 124.0 (*)    Calcium, Ion 1.11 (*)    HCT 36.0 (*)    Hemoglobin 12.2 (*)    All other components within normal limits  RESP PANEL BY RT-PCR (FLU A&B, COVID) ARPGX2  LACTIC ACID, PLASMA  APTT  URINALYSIS, MICROSCOPIC (REFLEX)  RPR                                                                                                                         EKG  EKG Interpretation  Date/Time:  Wednesday March 01 2021 00:14:49 EST  Ventricular Rate:  106 PR Interval:  170 QRS Duration: 82 QT Interval:  356 QTC Calculation: 473 R Axis:   14 Text Interpretation: Sinus tachycardia Anterior infarct, age indeterminate No old tracing to compare Confirmed by Drema Pry [phone]) on 03/01/2021 1:33:47 AM       Radiology CT HEAD WO CONTRAST ( )  Result Date: 03/01/2021 CLINICAL DATA:  85 year old male with history of mental status change. Confusion. EXAM: CT HEAD WITHOUT CONTRAST TECHNIQUE: Contiguous axial images were obtained from the base of the skull through the vertex without intravenous contrast. COMPARISON:  No priors. FINDINGS: Brain: Moderate cerebral and cerebellar atrophy. Patchy and confluent areas of decreased attenuation are noted throughout the deep and periventricular white matter of the cerebral hemispheres bilaterally, compatible with chronic microvascular ischemic disease. Physiologic calcifications in the left basal ganglia incidentally noted. No evidence of acute infarction, hemorrhage, hydrocephalus, extra-axial collection or mass lesion/mass effect. Vascular: No hyperdense vessel or unexpected calcification. Skull: Normal. Negative for fracture or focal lesion. Sinuses/Orbits: No acute finding. Other: None. IMPRESSION: 1. No acute intracranial abnormalities. 2. Moderate  cerebral and cerebellar atrophy with chronic microvascular ischemic changes in the cerebral white matter, as above. Electronically Signed   By: Trudie Reed M.D.   On: 03/01/2021 07:13   DG Chest Port 1 View  Result Date: 03/01/2021 CLINICAL DATA:  Questionable sepsis EXAM: PORTABLE CHEST 1 VIEW COMPARISON:  None. FINDINGS: Cardiac and mediastinal contours are within normal limits. No focal pulmonary opacity. No pleural effusion or pneumothorax. No acute osseous abnormality. Prior ACDF. IMPRESSION: No acute cardiopulmonary process. Electronically Signed   By: Wiliam Ke M.D.   On: 03/01/2021 01:53    Pertinent labs & imaging results that were available during my care of the patient were reviewed by me and considered in my medical decision making (see MDM for details).  Medications Ordered in ED Medications  0.9 %  sodium chloride infusion ( Intravenous New Bag/Given 03/01/21 0212)  sodium chloride 0.9 % bolus 1,000 mL (0 mLs Intravenous Stopped 03/01/21 0352)                                                                                                                                     Procedures Procedures  (including critical care time)  Medical Decision Making / ED Course I have reviewed the nursing notes for this encounter and the patient's prior records (if available in EHR or on provided paperwork).  Michael Jensen was evaluated in Emergency Department on 03/01/2021 for the symptoms described in the history of present illness. He was evaluated in the context of the global COVID-19 pandemic, which necessitated consideration that the patient might be at risk for infection with the SARS-CoV-2 virus that causes COVID-19. Institutional protocols and algorithms that pertain to the evaluation of patients at risk for COVID-19 are in a state of rapid change based on information released by regulatory bodies including the CDC and federal  and state organizations. These policies and  algorithms were followed during the patient's care in the ED.  AMS AF, but tachycardic. Normotensive. Infectious work up initiated. CBC without leukocytosis or significant anemia. No significant electrolyte derangements or renal sufficiency. Lactic acid is elevated at 3.1. Chest x-ray without evidence of pneumonia. COVID/influenza negative. UA without evidence of infection. Lactic acid and tachycardia cleared after IV fluids. No source of infection. Given IVF CT head negative Planned to admit given lack of known baseline.  Just before hospitalist came to evaluate the patient, the daughter called.  See below. Hospitalist recommended obtaining an RPR to assess for neurosyphilis. This can be followed up as outpatient. Daughter called and mentioned that his delusions have been ongoing for several months and been gradually worsening.  Urinary retention Patient noted to have greater than 200 cc in bladder. Unable to void. Coud catheter placed.  Pertinent labs & imaging results that were available during my care of the patient were reviewed by me and considered in my medical decision making:    Final Clinical Impression(s) / ED Diagnoses Final diagnoses:  Delusions [facility])  Urinary retention   The patient appears reasonably screened and/or stabilized for discharge and I doubt any other medical condition or other [facility] requiring further screening, evaluation, or treatment in the ED at this time prior to discharge. Safe for discharge with strict return precautions.  Disposition: Discharge  Condition: Good  I have discussed the results, Dx and Tx plan with the patient/family who expressed understanding and agree(s) with the plan. Discharge instructions discussed at length. The patient/family was given strict return precautions who verbalized understanding of the instructions. No further questions at time of discharge.    ED Discharge Orders     None        Follow Up: Soundra Pilon, FNP [address] [place] [address] [phone]  Call  to schedule an appointment for close follow up for RPR results.  [facility] [address] [place] [address] [phone] Call  to schedule an appointment for close follow up urinary retention     This chart was dictated using voice recognition software.  Despite best efforts to proofread,  errors can occur which can change the documentation meaning.    Nira Conn, MD 03/01/21 [phone]

## 2021-03-01 NOTE — Subjective & Objective (Addendum)
CC: altered mental status HPI: 85 year old African-American male with a history of cervical spinal stenosis, inability ambulate secondary to stenosis, chronic indwelling Foley, BPH, hypertension, type 2 diabetes who presents to the ER with altered mental status.  Per the nursing home, the patient is nonambulatory.  He started trying to walk this morning.  He was had a very unsteady gait.  Patient states that he has been living at [facility] since late summer.  This is verified as his date admission to the nursing home was September 2022.  EDP started a work-up on the patient.  He had some mild lactic acidosis that resolved with some gentle IV fluids.  Initially EDP was unable to contact the daughter.  Eventually daughter called the EDP back.  Per the EDP, the daughter reports the patient's had a slow decline over the last several months.  Has been increasingly confused.  No recent fevers, chills.  Patient has an indwelling catheter due to rare urinary retention.  CT head is pending.  I discussed the case with the EDP.  He plans on discharging the patient back to nursing home with the patient CT head is without acute intracranial findings.  Discussed TRH is available for repeat consultation and/or admission if needed.

## 2021-03-01 NOTE — ED Notes (Signed)
[facility] called to transport patient  

## 2021-03-01 NOTE — H&P (Shared)
History and Physical    Michael Jensen PYP:[id number] DOB: Jan 16, 1936 DOA: 03/01/2021  PCP: Soundra Pilon, FNP   Patient coming from: SN[facility]  I have personally briefly reviewed patient's old medical records in [facility]  CC: altered mental status HPI: 85 year old African-American male with a history of cervical spinal stenosis, inability ambulate secondary to stenosis, chronic indwelling Foley, BPH, hypertension, type 2 diabetes who presents to the ER with altered mental status.  Per the nursing home, the patient is nonambulatory.  He started trying to walk this morning.  He was had a very unsteady gait.  Patient states that he has been living at [facility] since late summer.  This is verified as his date admission to the nursing home was September 2022.  EDP started a work-up on the patient.  He had some mild lactic acidosis that resolved with some gentle IV fluids.  Initially EDP was unable to contact the daughter.  Eventually daughter called the EDP back.  Per the EDP, the daughter reports the patient's had a slow decline over the last several months.  Has been increasingly confused.  No recent fevers, chills.  Patient has an indwelling catheter due to rare urinary retention.  CT head is pending.  I discussed the case with the EDP.  He plans on discharging the patient back to nursing home with the patient CT head is without acute intracranial findings.  Discussed TRH is available for repeat consultation and/or admission if needed.   ED Course: ***  Review of Systems:  Review of Systems  Constitutional: Negative.  Negative for fever and weight loss.  HENT: Negative.  Negative for ear pain, hearing loss and tinnitus.   Eyes: Negative.  Negative for blurred vision, double vision and photophobia.       Pt is legally blind  Respiratory: Negative.  Negative for cough, hemoptysis and sputum production.   Cardiovascular: Negative.  Negative for chest pain, palpitations  and orthopnea.  Gastrointestinal: Negative.  Negative for heartburn, nausea and vomiting.  Genitourinary: Negative.  Negative for dysuria, frequency and urgency.       Pt has chronic foley  Musculoskeletal: Negative.  Negative for back pain, myalgias and neck pain.  Skin: Negative.   Neurological: Negative.  Negative for dizziness, tingling and headaches.       Denies recent falls  Endo/Heme/Allergies: Negative.   Psychiatric/Behavioral: Negative.  Negative for depression, substance abuse and suicidal ideas.   All other systems reviewed and are negative.  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 He has never used smokeless tobacco. He reports that he does not use drugs. No history on file for alcohol use.  No Known Allergies  No family history on file.  Prior to Admission medications   Medication Sig Start Date End Date Taking? Authorizing Provider  acetaminophen (TYLENOL) 500 MG tablet Take 1,000 mg by mouth every  6 (six) hours as needed for mild pain or headache.    [provider]  albuterol (VENTOLIN HFA) 108 (90 Base) MCG/ACT inhaler 2 puffs as needed Patient not taking: No sig reported 08/04/19   [provider]  Ascorbic Acid (VITAMIN C) 1000 MG tablet Take 1,000 mg by mouth daily.    [provider]  aspirin EC 81 MG tablet Take 1 tablet (81 mg total) by mouth  daily. Patient not taking: No sig reported 08/09/17   Deeann Saint, MD  atorvastatin (LIPITOR) 40 MG tablet Take 40 mg by mouth daily.    [provider]  brimonidine (ALPHAGAN) 0.2 % ophthalmic solution Place 1 drop into both eyes 2 (two) times daily. 12/23/17   [provider]  dorzolamide (TRUSOPT) 2 % ophthalmic solution Place 1 drop into both eyes 2 (two) times daily. 10/22/17   [provider]  insulin aspart (NOVOLOG) 100 UNIT/ML injection 0-15 Units, Subcutaneous, 3 times daily with meals CBG < 70: Implement Hypoglycemia measures CBG 70 - 120: 0 units CBG 121 - 150: 2 units CBG 151 - 200: 3 units CBG 201 - 250: 5 units CBG 251 - 300: 8 units CBG 301 - 350: 11 units CBG 351 - 400: 15 units CBG > 400: call MD 12/01/20   Maretta Bees, MD  latanoprost (XALATAN) 0.005 % ophthalmic solution Place 1 drop into the right eye at bedtime. 12/01/20   Ghimire, Werner Lean, MD  lisinopril (ZESTRIL) 10 MG tablet Take 10 mg by mouth daily.    [provider]  metFORMIN (GLUCOPHAGE) 500 MG tablet Take 500 mg by mouth 2 (two) times daily with a meal.    [provider]  metoprolol tartrate (LOPRESSOR) 25 MG tablet Take 25 mg by mouth 2 (two) times daily with a meal.    [provider]  Multiple Vitamins-Minerals (CENTRUM SILVER) tablet Take 1 tablet by mouth daily with breakfast.    [provider]  pantoprazole (PROTONIX) 40 MG tablet Take 40 mg by mouth daily before breakfast.    [provider]  tamsulosin (FLOMAX) 0.4 MG CAPS capsule Take 1 capsule (0.4 mg total) by mouth in the morning and at bedtime. 12/01/20   Maretta Bees, MD    Physical Exam: Vitals:   03/01/21 0300 03/01/21 0330 03/01/21 0400 03/01/21 0500  BP: 121/76 (!) 117/95 121/74 130/79  Pulse: 99   (!) 104  Resp: Temp:      TempSrc:      SpO2: 99%   100%  Weight:      Height:        Physical Exam Vitals and nursing note reviewed.   Constitutional:      General: He is not in acute distress.    Appearance: Normal appearance. He is not ill-appearing, toxic-appearing or diaphoretic.  HENT:     Head: Normocephalic and atraumatic.     Nose: Nose normal. No rhinorrhea.  Eyes:     General: No scleral icterus. Neck:     Comments: Increased cervical kyphosis. Cardiovascular:     Rate and Rhythm: Normal rate and regular rhythm.     Pulses: Normal pulses.  Pulmonary:     Effort: Pulmonary effort is normal. No respiratory distress.     Breath sounds: Normal breath sounds. No wheezing or rales.  Abdominal:     General: Abdomen is flat. Bowel sounds are normal. There is no distension.     Tenderness: There  is no abdominal tenderness. There is no guarding.  Genitourinary:    Comments: Positive indwelling Foley catheter. Skin:    General: Skin is warm and dry.     Capillary Refill: Capillary refill takes less than 2 seconds.     Comments: Small stage II right heel decubitus ulcer.  Less than 2 cm in diameter.  Patient has a heel boot protector on the right heel.  Wound was dressed with a ABD pad and wrapped with Kerlix.  No drainage on the bandage.  Neurological:     Mental Status: He is alert and oriented to person, place, and time.     Comments: Patient having some auditory hallucinations.  He thought there were other people in the room during the interview.  Overall he was oriented to time (December 2022), he knew he was not in a nursing home.  He thought he was in the hospital but cannot name which one.  He was not oriented to situation as he was not clear why he was in the hospital.  He did not recall that he was trying to walk today when he is considered nonambulatory.  He was having trouble with historical recall.     Labs on Admission: I have personally reviewed following labs and imaging studies  CBC: Recent Labs  Lab 03/01/21 0033 03/01/21 0224 03/01/21 0225  WBC 5.7  --   --   NEUTROABS 3.9  --   --   HGB  12.3* 12.6* 12.2*  HCT 38.6* 37.0* 36.0*  MCV 84.5  --   --   PLT 149*  --   --    Basic Metabolic Panel: Recent Labs  Lab 03/01/21 0033 03/01/21 0224 03/01/21 0225  NA 135 138 139  K 4.2 3.8 3.9  CL 106 107  --   CO2 18*  --   --   GLUCOSE 122* 126*  --   BUN 10 10  --   CREATININE 0.68 0.50*  --   CALCIUM 8.9  --   --    GFR: Estimated Creatinine Clearance: 70 mL/min (A) (by C-G formula based on SCr of 0.5 mg/dL (L)). Liver Function Tests: Recent Labs  Lab 03/01/21 0033  AST 29  ALT 15  ALKPHOS 83  BILITOT 0.8  PROT 6.1*  ALBUMIN 3.1*   No results for input(s): LIPASE, AMYLASE in the last 168 hours. No results for input(s): AMMONIA in the last 168 hours. Coagulation Profile: Recent Labs  Lab 03/01/21 0033  INR 1.4*   Cardiac Enzymes: No results for input(s): CKTOTAL, CKMB, CKMBINDEX, TROPONINI in the last 168 hours. BNP (last 3 results) No results for input(s): PROBNP in the last 8760 hours. HbA1C: No results for input(s): HGBA1C in the last 72 hours. CBG: No results for input(s): GLUCAP in the last 168 hours. Lipid Profile: No results for input(s): CHOL, HDL, LDLCALC, TRIG, CHOLHDL, LDLDIRECT in the last 72 hours. Thyroid Function Tests: No results for input(s): TSH, T4TOTAL, FREET4, T3FREE, THYROIDAB in the last 72 hours. Anemia Panel: No results for input(s): VITAMINB12, FOLATE, FERRITIN, TIBC, IRON, RETICCTPCT in the last 72 hours. Urine analysis:    Component Value Date/Time   COLORURINE YELLOW 03/01/2021 0439   APPEARANCEUR CLEAR 03/01/2021 0439   LABSPEC 1.010 03/01/2021 0439   PHURINE 5.5 03/01/2021 0439   GLUCOSEU NEGATIVE 03/01/2021 0439   HGBUR MODERATE (A) 03/01/2021 0439   BILIRUBINUR NEGATIVE 03/01/2021 0439   KETONESUR NEGATIVE 03/01/2021 0439   PROTEINUR NEGATIVE 03/01/2021 0439   NITRITE  NEGATIVE 03/01/2021 0439   LEUKOCYTESUR NEGATIVE 03/01/2021 0439    Radiological Exams on Admission: I have personally reviewed images DG  Chest Port 1 View  Result Date: 03/01/2021 CLINICAL DATA:  Questionable sepsis EXAM: PORTABLE CHEST 1 VIEW COMPARISON:  None. FINDINGS: Cardiac and mediastinal contours are within normal limits. No focal pulmonary opacity. No pleural effusion or pneumothorax. No acute osseous abnormality. Prior ACDF. IMPRESSION: No acute cardiopulmonary process. Electronically Signed   By: Wiliam Ke M.D.   On: 03/01/2021 01:53    EKG: I have personally reviewed EKG: sinus tachycardia    Assessment/Plan Principal Problem:   Cognitive impairment Active Problems:   Essential hypertension   Spinal stenosis, cervicothoracic region   Chronic indwelling Foley catheter   Benign prostatic hyperplasia without lower urinary tract symptoms   Diabetic peripheral neuropathy associated with type 2 diabetes mellitus (HCC)    Cognitive impairment Appears to be chronic in nature.  Per report, the patient's cognitive decline has been over the last several months.  Recommend CT head.  Also recommend checking RPR for completeness sake.  This does not have to be during his ER visit but can be done at the nursing home in an outpatient setting.  Essential hypertension Stable.  Spinal stenosis, cervicothoracic region Chronic.  Chronic indwelling Foley catheter Chronic.  No evidence of UTI.  Benign prostatic hyperplasia without lower urinary tract symptoms Has an indwelling Foley.  Is on Flomax and nursing home.  Will need another voiding trial with urology.  Diabetic peripheral neuropathy associated with type 2 diabetes mellitus (HCC) Stable.  DVT prophylaxis: {Blank single:19197::"Lovenox","SQ Heparin","IV heparin gtts","Xarelto","Eliquis","Coumadin","SCDs","***"} Code Status: {Blank single:19197::"Full Code","DNR with Intubation","DNR/DNI(Do NOT Intubate)","Comfort Care","***"} Family Communication: ***  Disposition Plan: ***  Consults called: ***  Admission status: {Blank  single:19197::"Observation","Inpatient"}, {Blank single:19197::"Med-Surg","Telemetry bed","Step Down Unit"}   Carollee Herter, DO [facility] 03/01/2021, 6:00 AM

## 2021-03-01 NOTE — ED Notes (Signed)
Pt verbalizes understanding of discharge instructions. Opportunity for questions and answers were provided. Pt discharged from the ED to [facility] via [facility].  

## 2021-03-01 NOTE — Assessment & Plan Note (Signed)
Has an indwelling Foley.  Is on Flomax and nursing home.  Will need another voiding trial with urology.

## 2021-03-01 NOTE — Assessment & Plan Note (Signed)
Chronic.  No evidence of UTI.

## 2021-03-01 NOTE — ED Notes (Signed)
Attempted to call [facility] after hours through [employer] and they said they will not take the information because [facility] opens at 8 am. Will call [facility] directly when they open at 8 am.

## 2021-03-05 ENCOUNTER — Encounter: Payer: Self-pay | Admitting: Podiatry

## 2021-03-15 DIAGNOSIS — N400 Enlarged prostate without lower urinary tract symptoms: Secondary | ICD-10-CM | POA: Diagnosis not present

## 2021-03-15 DIAGNOSIS — K219 Gastro-esophageal reflux disease without esophagitis: Secondary | ICD-10-CM | POA: Diagnosis not present

## 2021-03-15 DIAGNOSIS — G629 Polyneuropathy, unspecified: Secondary | ICD-10-CM | POA: Diagnosis not present

## 2021-03-15 DIAGNOSIS — L89619 Pressure ulcer of right heel, unspecified stage: Secondary | ICD-10-CM | POA: Diagnosis not present

## 2021-03-15 DIAGNOSIS — I82401 Acute embolism and thrombosis of unspecified deep veins of right lower extremity: Secondary | ICD-10-CM | POA: Diagnosis not present

## 2021-03-15 DIAGNOSIS — M6281 Muscle weakness (generalized): Secondary | ICD-10-CM | POA: Diagnosis not present

## 2021-03-16 DIAGNOSIS — L89614 Pressure ulcer of right heel, stage 4: Secondary | ICD-10-CM | POA: Diagnosis not present

## 2021-03-18 DIAGNOSIS — H548 Legal blindness, as defined in USA: Secondary | ICD-10-CM | POA: Diagnosis not present

## 2021-03-18 DIAGNOSIS — M6281 Muscle weakness (generalized): Secondary | ICD-10-CM | POA: Diagnosis not present

## 2021-03-18 DIAGNOSIS — L89619 Pressure ulcer of right heel, unspecified stage: Secondary | ICD-10-CM | POA: Diagnosis not present

## 2021-03-18 DIAGNOSIS — K219 Gastro-esophageal reflux disease without esophagitis: Secondary | ICD-10-CM | POA: Diagnosis not present

## 2021-03-18 DIAGNOSIS — I82401 Acute embolism and thrombosis of unspecified deep veins of right lower extremity: Secondary | ICD-10-CM | POA: Diagnosis not present

## 2021-03-18 DIAGNOSIS — I10 Essential (primary) hypertension: Secondary | ICD-10-CM | POA: Diagnosis not present

## 2021-03-18 DIAGNOSIS — G629 Polyneuropathy, unspecified: Secondary | ICD-10-CM | POA: Diagnosis not present

## 2021-03-19 ENCOUNTER — Encounter (HOSPITAL_COMMUNITY): Payer: Self-pay | Admitting: Internal Medicine

## 2021-03-19 ENCOUNTER — Inpatient Hospital Stay (HOSPITAL_COMMUNITY)
Admission: EM | Admit: 2021-03-19 | Discharge: 2021-03-21 | DRG: 309 | Disposition: A | Discharge status: Hospice | Payer: Medicare HMO | Source: Skilled Nursing Facility | Attending: Internal Medicine | Admitting: Internal Medicine

## 2021-03-19 ENCOUNTER — Emergency Department (HOSPITAL_COMMUNITY): Payer: Medicare HMO

## 2021-03-19 ENCOUNTER — Other Ambulatory Visit: Payer: Self-pay

## 2021-03-19 DIAGNOSIS — Z7982 Long term (current) use of aspirin: Secondary | ICD-10-CM

## 2021-03-19 DIAGNOSIS — Z515 Encounter for palliative care: Secondary | ICD-10-CM

## 2021-03-19 DIAGNOSIS — Z20822 Contact with and (suspected) exposure to covid-19: Secondary | ICD-10-CM | POA: Diagnosis not present

## 2021-03-19 DIAGNOSIS — I248 Other forms of acute ischemic heart disease: Secondary | ICD-10-CM | POA: Diagnosis not present

## 2021-03-19 DIAGNOSIS — R531 Weakness: Secondary | ICD-10-CM | POA: Diagnosis not present

## 2021-03-19 DIAGNOSIS — I214 Non-ST elevation (NSTEMI) myocardial infarction: Secondary | ICD-10-CM | POA: Diagnosis not present

## 2021-03-19 DIAGNOSIS — R069 Unspecified abnormalities of breathing: Secondary | ICD-10-CM | POA: Diagnosis not present

## 2021-03-19 DIAGNOSIS — R627 Adult failure to thrive: Secondary | ICD-10-CM | POA: Diagnosis present

## 2021-03-19 DIAGNOSIS — Z960 Presence of urogenital implants: Secondary | ICD-10-CM | POA: Diagnosis present

## 2021-03-19 DIAGNOSIS — H409 Unspecified glaucoma: Secondary | ICD-10-CM | POA: Diagnosis present

## 2021-03-19 DIAGNOSIS — E1142 Type 2 diabetes mellitus with diabetic polyneuropathy: Secondary | ICD-10-CM | POA: Diagnosis not present

## 2021-03-19 DIAGNOSIS — I10 Essential (primary) hypertension: Secondary | ICD-10-CM | POA: Diagnosis not present

## 2021-03-19 DIAGNOSIS — I491 Atrial premature depolarization: Secondary | ICD-10-CM | POA: Diagnosis not present

## 2021-03-19 DIAGNOSIS — N400 Enlarged prostate without lower urinary tract symptoms: Secondary | ICD-10-CM | POA: Diagnosis present

## 2021-03-19 DIAGNOSIS — I471 Supraventricular tachycardia, unspecified: Secondary | ICD-10-CM | POA: Diagnosis present

## 2021-03-19 DIAGNOSIS — Z86718 Personal history of other venous thrombosis and embolism: Secondary | ICD-10-CM

## 2021-03-19 DIAGNOSIS — Z7401 Bed confinement status: Secondary | ICD-10-CM

## 2021-03-19 DIAGNOSIS — Z7189 Other specified counseling: Secondary | ICD-10-CM | POA: Diagnosis not present

## 2021-03-19 DIAGNOSIS — E782 Mixed hyperlipidemia: Secondary | ICD-10-CM | POA: Diagnosis present

## 2021-03-19 DIAGNOSIS — N401 Enlarged prostate with lower urinary tract symptoms: Secondary | ICD-10-CM | POA: Diagnosis present

## 2021-03-19 DIAGNOSIS — F0390 Unspecified dementia without behavioral disturbance: Secondary | ICD-10-CM | POA: Diagnosis present

## 2021-03-19 DIAGNOSIS — R4189 Other symptoms and signs involving cognitive functions and awareness: Secondary | ICD-10-CM | POA: Diagnosis present

## 2021-03-19 DIAGNOSIS — R000 Tachycardia, unspecified: Secondary | ICD-10-CM | POA: Diagnosis not present

## 2021-03-19 DIAGNOSIS — M1990 Unspecified osteoarthritis, unspecified site: Secondary | ICD-10-CM | POA: Diagnosis present

## 2021-03-19 DIAGNOSIS — R609 Edema, unspecified: Secondary | ICD-10-CM | POA: Diagnosis not present

## 2021-03-19 DIAGNOSIS — E663 Overweight: Secondary | ICD-10-CM | POA: Diagnosis present

## 2021-03-19 DIAGNOSIS — E860 Dehydration: Secondary | ICD-10-CM | POA: Diagnosis present

## 2021-03-19 DIAGNOSIS — Z87891 Personal history of nicotine dependence: Secondary | ICD-10-CM

## 2021-03-19 DIAGNOSIS — I959 Hypotension, unspecified: Secondary | ICD-10-CM | POA: Diagnosis present

## 2021-03-19 DIAGNOSIS — K219 Gastro-esophageal reflux disease without esophagitis: Secondary | ICD-10-CM | POA: Diagnosis present

## 2021-03-19 DIAGNOSIS — H548 Legal blindness, as defined in USA: Secondary | ICD-10-CM | POA: Diagnosis present

## 2021-03-19 DIAGNOSIS — Z66 Do not resuscitate: Secondary | ICD-10-CM | POA: Diagnosis not present

## 2021-03-19 DIAGNOSIS — R197 Diarrhea, unspecified: Secondary | ICD-10-CM | POA: Diagnosis present

## 2021-03-19 DIAGNOSIS — I499 Cardiac arrhythmia, unspecified: Secondary | ICD-10-CM | POA: Diagnosis not present

## 2021-03-19 DIAGNOSIS — Z87440 Personal history of urinary (tract) infections: Secondary | ICD-10-CM

## 2021-03-19 DIAGNOSIS — M4802 Spinal stenosis, cervical region: Secondary | ICD-10-CM | POA: Diagnosis present

## 2021-03-19 DIAGNOSIS — R778 Other specified abnormalities of plasma proteins: Secondary | ICD-10-CM

## 2021-03-19 DIAGNOSIS — F03B0 Unspecified dementia, moderate, without behavioral disturbance, psychotic disturbance, mood disturbance, and anxiety: Secondary | ICD-10-CM | POA: Diagnosis not present

## 2021-03-19 DIAGNOSIS — Z6828 Body mass index (BMI) 28.0-28.9, adult: Secondary | ICD-10-CM

## 2021-03-19 DIAGNOSIS — R7989 Other specified abnormal findings of blood chemistry: Secondary | ICD-10-CM | POA: Diagnosis present

## 2021-03-19 DIAGNOSIS — Z794 Long term (current) use of insulin: Secondary | ICD-10-CM

## 2021-03-19 DIAGNOSIS — Z79899 Other long term (current) drug therapy: Secondary | ICD-10-CM

## 2021-03-19 DIAGNOSIS — R262 Difficulty in walking, not elsewhere classified: Secondary | ICD-10-CM | POA: Diagnosis present

## 2021-03-19 LAB — BASIC METABOLIC PANEL
Anion gap: 9 (ref 5–15)
BUN: 27 mg/dL — ABNORMAL HIGH (ref 8–23)
CO2: 19 mmol/L — ABNORMAL LOW (ref 22–32)
Calcium: 8.4 mg/dL — ABNORMAL LOW (ref 8.9–10.3)
Chloride: 108 mmol/L (ref 98–111)
Creatinine, Ser: 1.14 mg/dL (ref 0.61–1.24)
GFR, Estimated: >60 mL/min (ref >60)
Glucose, Bld: 208 mg/dL — ABNORMAL HIGH (ref 70–99)
Potassium: 4 mmol/L (ref 3.5–5.1)
Sodium: 136 mmol/L (ref 135–145)

## 2021-03-19 LAB — CBC
HCT: 38.1 % — ABNORMAL LOW (ref 39.0–52.0)
Hemoglobin: 12.2 g/dL — ABNORMAL LOW (ref 13.0–17.0)
MCH: 26.9 pg (ref 26.0–34.0)
MCHC: 32 g/dL (ref 30.0–36.0)
MCV: 83.9 fL (ref 80.0–100.0)
Platelets: UNDETERMINED 10*3/uL (ref 150–400)
RBC: 4.54 MIL/uL (ref 4.22–5.81)
RDW: 16 % — ABNORMAL HIGH (ref 11.5–15.5)
WBC: 11.1 10*3/uL — ABNORMAL HIGH (ref 4.0–10.5)
nRBC: 0 % (ref 0.0–0.2)

## 2021-03-19 LAB — CBG MONITORING, ED: Glucose-Capillary: 194 mg/dL — ABNORMAL HIGH (ref 70–99)

## 2021-03-19 LAB — RESP PANEL BY RT-PCR (FLU A&B, COVID) ARPGX2
Influenza A by PCR: NEGATIVE
Influenza B by PCR: NEGATIVE
SARS Coronavirus 2 by RT PCR: NEGATIVE

## 2021-03-19 LAB — GLUCOSE, CAPILLARY: Glucose-Capillary: 150 mg/dL — ABNORMAL HIGH (ref 70–99)

## 2021-03-19 LAB — PROTIME-INR
INR: 1.3 — ABNORMAL HIGH (ref 0.8–1.2)
Prothrombin Time: 16 seconds — ABNORMAL HIGH (ref 11.4–15.2)

## 2021-03-19 LAB — APTT: aPTT: 31 seconds (ref 24–36)

## 2021-03-19 LAB — TROPONIN I (HIGH SENSITIVITY)
Troponin I (High Sensitivity): 1150 ng/L (ref <18)
Troponin I (High Sensitivity): 1317 ng/L (ref <18)

## 2021-03-19 LAB — LACTIC ACID, PLASMA
Lactic Acid, Venous: 4.1 mmol/L (ref 0.5–1.9)
Lactic Acid, Venous: 3.3 mmol/L (ref 0.5–1.9)

## 2021-03-19 LAB — HEPARIN LEVEL (UNFRACTIONATED): Heparin Unfractionated: >1.1 IU/mL — ABNORMAL HIGH (ref 0.30–0.70)

## 2021-03-19 LAB — MAGNESIUM: Magnesium: 1.6 mg/dL — ABNORMAL LOW (ref 1.7–2.4)

## 2021-03-19 LAB — TSH: TSH: 1.908 u[IU]/mL (ref 0.350–4.500)

## 2021-03-19 IMAGING — DX DG CHEST 1V PORT
1 series · 1 of 1 positions shown · non-contrast
Comparison: [DATE]

CLINICAL DATA: Breathing problems.

EXAM:
PORTABLE CHEST 1 VIEW

[chest ap]
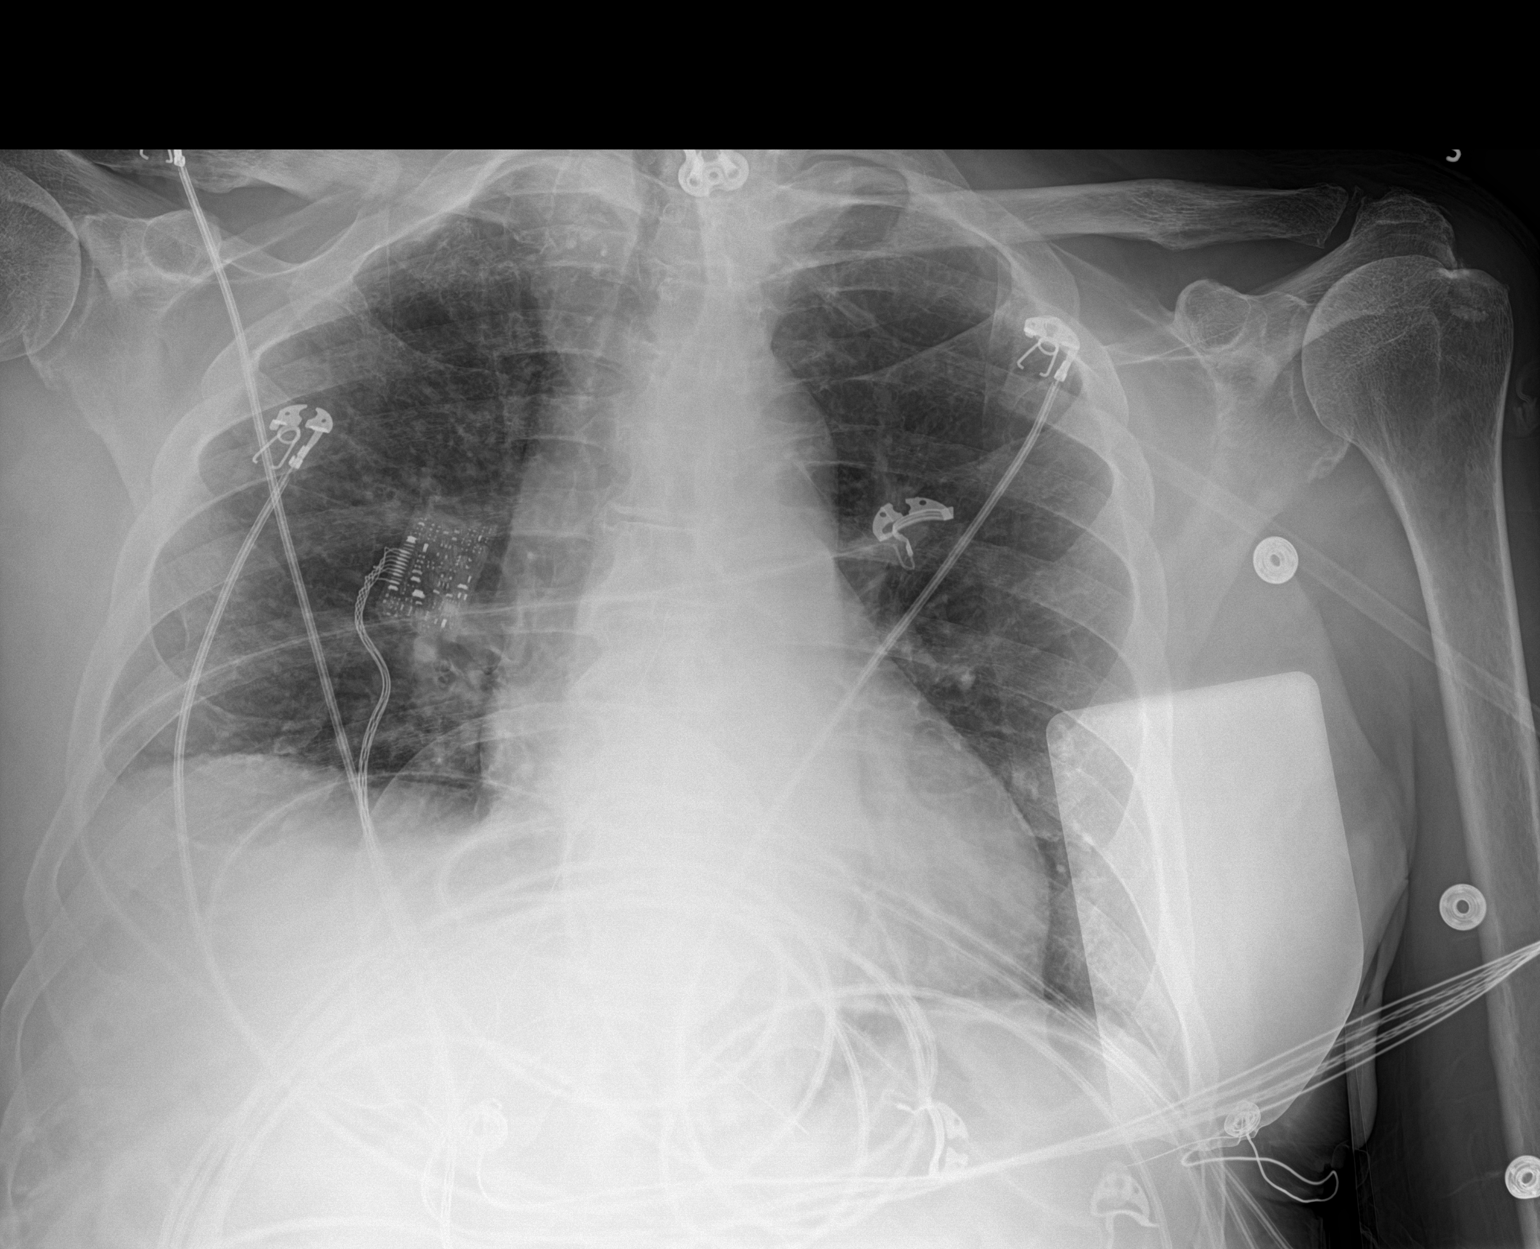

[1 of 1 positions shown; findings below may reference images not displayed]

FINDINGS: Cardiac silhouette is normal in size. No mediastinal or hilar
masses.

Clear lungs.  No convincing pleural effusion.  No pneumothorax.

Skeletal structures are grossly intact.
IMPRESSION: No active disease.

## 2021-03-19 MED ORDER — DORZOLAMIDE HCL 2 % OP SOLN
1.0000 [drp] | Freq: Two times a day (BID) | OPHTHALMIC | Status: DC
  Administered 2021-03-19 – 2021-03-21 (×4): 1 [drp] via OPHTHALMIC
  Filled 2021-03-19: qty 10

## 2021-03-19 MED ORDER — PANTOPRAZOLE SODIUM 40 MG PO TBEC
40.0000 mg | DELAYED_RELEASE_TABLET | Freq: Every day | ORAL | Status: DC
  Administered 2021-03-20 – 2021-03-21 (×2): 40 mg via ORAL
  Filled 2021-03-19 (×2): qty 1

## 2021-03-19 MED ORDER — MORPHINE SULFATE (PF) 2 MG/ML IV SOLN
2.0000 mg | INTRAVENOUS | Status: DC | PRN
  Administered 2021-03-20 (×2): 2 mg via INTRAVENOUS
  Filled 2021-03-19 (×2): qty 1

## 2021-03-19 MED ORDER — ONDANSETRON HCL 4 MG/2ML IJ SOLN: 4.0000 mg | Freq: Four times a day (QID) | INTRAMUSCULAR | Status: DC | PRN

## 2021-03-19 MED ORDER — METOPROLOL TARTRATE 25 MG PO TABS
25.0000 mg | ORAL_TABLET | Freq: Two times a day (BID) | ORAL | Status: DC
  Administered 2021-03-20 – 2021-03-21 (×3): 25 mg via ORAL
  Filled 2021-03-19 (×3): qty 1

## 2021-03-19 MED ORDER — LATANOPROST 0.005 % OP SOLN
1.0000 [drp] | Freq: Every day | OPHTHALMIC | Status: DC
  Administered 2021-03-19 – 2021-03-20 (×2): 1 [drp] via OPHTHALMIC
  Filled 2021-03-19: qty 2.5

## 2021-03-19 MED ORDER — SODIUM CHLORIDE 0.9 % IV SOLN
1000.0000 mL | INTRAVENOUS | Status: DC
  Administered 2021-03-19: 1000 mL via INTRAVENOUS

## 2021-03-19 MED ORDER — ASPIRIN 81 MG PO CHEW
324.0000 mg | CHEWABLE_TABLET | Freq: Once | ORAL | Status: AC
  Administered 2021-03-19: 324 mg via ORAL
  Filled 2021-03-19: qty 4

## 2021-03-19 MED ORDER — HYDRALAZINE HCL 20 MG/ML IJ SOLN: 5.0000 mg | INTRAMUSCULAR | Status: DC | PRN

## 2021-03-19 MED ORDER — ADENOSINE 6 MG/2ML IV SOLN
INTRAVENOUS | Status: AC
  Filled 2021-03-19: qty 2

## 2021-03-19 MED ORDER — BRIMONIDINE TARTRATE 0.2 % OP SOLN
1.0000 [drp] | Freq: Two times a day (BID) | OPHTHALMIC | Status: DC
  Administered 2021-03-19 – 2021-03-21 (×4): 1 [drp] via OPHTHALMIC
  Filled 2021-03-19: qty 5

## 2021-03-19 MED ORDER — ACETAMINOPHEN 650 MG RE SUPP: 650.0000 mg | Freq: Four times a day (QID) | RECTAL | Status: DC | PRN

## 2021-03-19 MED ORDER — LACTATED RINGERS IV SOLN: INTRAVENOUS | Status: DC

## 2021-03-19 MED ORDER — SODIUM CHLORIDE 0.9 % IV BOLUS (SEPSIS)
500.0000 mL | Freq: Once | INTRAVENOUS | Status: AC
  Administered 2021-03-19: 500 mL via INTRAVENOUS

## 2021-03-19 MED ORDER — TAMSULOSIN HCL 0.4 MG PO CAPS
0.4000 mg | ORAL_CAPSULE | Freq: Two times a day (BID) | ORAL | Status: DC
  Administered 2021-03-20 – 2021-03-21 (×3): 0.4 mg via ORAL
  Filled 2021-03-19 (×3): qty 1

## 2021-03-19 MED ORDER — ACETAMINOPHEN 325 MG PO TABS: 650.0000 mg | ORAL_TABLET | Freq: Four times a day (QID) | ORAL | Status: DC | PRN

## 2021-03-19 MED ORDER — SODIUM CHLORIDE 0.9 % IV SOLN
INTRAVENOUS | Status: DC | PRN
  Administered 2021-03-19: 1000 mL via INTRAVENOUS

## 2021-03-19 MED ORDER — SODIUM CHLORIDE 0.9% FLUSH
3.0000 mL | Freq: Two times a day (BID) | INTRAVENOUS | Status: DC
  Administered 2021-03-19 – 2021-03-21 (×4): 3 mL via INTRAVENOUS

## 2021-03-19 MED ORDER — ONDANSETRON HCL 4 MG PO TABS: 4.0000 mg | ORAL_TABLET | Freq: Four times a day (QID) | ORAL | Status: DC | PRN

## 2021-03-19 MED ORDER — ADENOSINE 6 MG/2ML IV SOLN
INTRAVENOUS | Status: DC | PRN
  Administered 2021-03-19: 12 mg via INTRAVENOUS

## 2021-03-19 MED ORDER — HEPARIN (PORCINE) 25000 UT/250ML-% IV SOLN
1250.0000 [IU]/h | INTRAVENOUS | Status: DC
  Administered 2021-03-19: 23:00:00 1100 [IU]/h via INTRAVENOUS
  Filled 2021-03-19: qty 250

## 2021-03-19 MED ORDER — BISACODYL 5 MG PO TBEC: 5.0000 mg | DELAYED_RELEASE_TABLET | Freq: Every day | ORAL | Status: DC | PRN

## 2021-03-19 MED ORDER — POLYETHYLENE GLYCOL 3350 17 G PO PACK: 17.0000 g | PACK | Freq: Every day | ORAL | Status: DC | PRN

## 2021-03-19 MED ORDER — SODIUM CHLORIDE 0.9 % IV BOLUS
500.0000 mL | Freq: Once | INTRAVENOUS | Status: AC
  Administered 2021-03-19: 500 mL via INTRAVENOUS

## 2021-03-19 MED ORDER — ALBUTEROL SULFATE (2.5 MG/3ML) 0.083% IN NEBU: 2.5000 mg | INHALATION_SOLUTION | RESPIRATORY_TRACT | Status: DC | PRN

## 2021-03-19 MED ORDER — INSULIN ASPART 100 UNIT/ML IJ SOLN
0.0000 [IU] | Freq: Three times a day (TID) | INTRAMUSCULAR | Status: DC
  Administered 2021-03-20 (×3): 2 [IU] via SUBCUTANEOUS

## 2021-03-19 MED ORDER — ATORVASTATIN CALCIUM 40 MG PO TABS
40.0000 mg | ORAL_TABLET | Freq: Every day | ORAL | Status: DC
  Administered 2021-03-20 – 2021-03-21 (×2): 40 mg via ORAL
  Filled 2021-03-19 (×2): qty 1

## 2021-03-19 MED ORDER — ADENOSINE 6 MG/2ML IV SOLN
INTRAVENOUS | Status: AC
  Administered 2021-03-19: 6 mg
  Filled 2021-03-19: qty 2

## 2021-03-19 NOTE — ED Notes (Signed)
Unable to obtain 2nd set of blood cultures, Lynelle Doctor MD aware

## 2021-03-19 NOTE — Assessment & Plan Note (Signed)
-  Some concern for sepsis with SVT, hypotension -Lactate is markedly elevated -However, this may all be related to hypoperfusion in the setting of marked SVT -Will trend lactate -UA is pending -Blood and urine cultures are pending -He did have diarrhea x 4 episodes yesterday but none since -For right now, will hold antibiotics and continue to follow but would have a low threshold to start if lactate is not downtrending, fever develops, or there are new concerns

## 2021-03-19 NOTE — Assessment & Plan Note (Addendum)
-  Patient presenting with asymptomatic SVT, incidentally diagnosed when family noticed that he appeared to be breathing funny -2 episodes, HR >200, responded to Adenosine 6 -> 12 mg and currently in sinus or with mild sinus tachycardia -Patient comfortable and asymptomatic throughout -Potential etiologies include infection (had diarrhea yesterday, resolved); dehydration; MI; or other -Will observe overnight on telemetry in progressive care unit -Cardiology is managing cardiac issues -Echo ordered by cardiology -Markedly elevated troponin, trending; this could be related to ischemia (he is a very poor candidate for intervention) or demand in the setting of periodic SVT of unknown duration -He has been started on heparin -Of note, apixiban is listed on his prior med rec (not completed as of now with updates) but it is not clear why he would have been taking this by cursory review of prior recent hospitalizations/notes -With ambulatory dysfunction and dementia, his daughter is very concerned about quality of life and appears to be seriously considering DNR and maybe even comfort if cause for SVT is not identified

## 2021-03-19 NOTE — Assessment & Plan Note (Signed)
-  Hypotensive during SVT and soft BPs (90/60s) since -Likely associated with hypoperfusion but sepsis is a consideration  -Will continue gentle IVF -Resume BB in AM if BP can tolerate -Hold Lisinopril

## 2021-03-19 NOTE — Assessment & Plan Note (Signed)
-  He is not ambulatory and has not walked in maybe a year -Will need to return to SNF at the time of dc

## 2021-03-19 NOTE — Assessment & Plan Note (Signed)
-  Legally blind -Continue drops - latanoprost, dorzolamide, brimonidine

## 2021-03-19 NOTE — Assessment & Plan Note (Signed)
-  Continue Lipitor °

## 2021-03-19 NOTE — Consult Note (Signed)
CARDIOLOGY CONSULT NOTE       Patient ID: Michael Jensen MRN: [id number] DOB/AGE: 84-Jul-1937 84 y.o.  Admit date: 03/19/2021 Referring Physician: Lynelle Doctor Primary Physician: Soundra Pilon, FNP Primary Cardiologist: New Reason for Consultation: SVT  Active Problems:   * No active hospital problems. *   HPI:  86 y.o. brought in from NH by EMS with SVT. Patient has dementia and is bed ridden.  He is also blind Had severe spinal stenosis with C7-T1 ACDF on 11/28/20 and really has not recovered He has had poor oral intake last 48 hours. Most of history is from daughter and grandson at bedside. He denies andy chest pain, dyspnea or palpitations. Felt weak this am. Hospitalization 12/01/20 also complicated by urinary retention and enterobacter UTI. He has HTN, HLD and DM.  Received adenosine 6/12 mg with break in SVT rate > 200 now in SR with PACls rate 90's.  ECG is non acute but troponin is elevated at 1150 ? 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  Social History   Socioeconomic History   Marital status: Married    Spouse name: Not on file   Number of children: Not on file   Years of education: Not on file   Highest education level: Not on file  Occupational History   Not on file  Tobacco Use   Smoking status: Former   Smokeless tobacco: Never  Substance and Sexual Activity   Alcohol use: Not on file   Drug use: Never   Sexual activity: Not on file  Other Topics Concern   Not on file  Social History Narrative   Not on file   Social Determinants of Health   Financial Resource Strain: Not on file  Food Insecurity: Not on file   Transportation Needs: Not on file  Physical Activity: Not on file  Stress: Not on file  Social Connections: Not on file  Intimate Partner Violence: Not on file    Past Surgical History:  Procedure Laterality Date   ANTERIOR CERVICAL DECOMP/DISCECTOMY FUSION N/A 11/28/2020   Procedure: ANTERIOR CERVICAL DECOMPRESSION/DISCECTOMY FUSION  CERVICAL SEVEN(C7)-THORACIC ONE(T1);  Surgeon: Jadene Pierini, MD;  Location: [facility] OR;  Service: Neurosurgery;  Laterality: N/A;   HERNIA REPAIR        Current Facility-Administered Medications:    0.9 %  sodium chloride infusion, , Intravenous, Continuous PRN, Linwood Dibbles, MD, Stopped at 03/19/21 1620   [COMPLETED] sodium chloride 0.9 % bolus 500 mL, 500 mL, Intravenous, Once, Stopped at 03/19/21 1620 **FOLLOWED BY** 0.9 %  sodium chloride infusion, 1,000 mL, Intravenous, Continuous, Linwood Dibbles, MD   adenosine (ADENOCARD) 6 MG/2ML injection, , Intravenous, PRN, Linwood Dibbles, MD, 12 mg at 03/19/21 1409   adenosine (ADENOCARD) 6 MG/2ML injection, , , ,    aspirin chewable tablet 324 mg, 324 mg, Oral, Once, Linwood Dibbles, MD  Current Outpatient Medications:    acetaminophen (TYLENOL) 500 MG tablet, Take 1,000 mg by mouth every 6 (six) hours as needed for mild pain or headache., Disp: , Rfl:    albuterol (VENTOLIN HFA) 108 (90 Base)  MCG/ACT inhaler, 2 puffs as needed (Patient not taking: No sig reported), Disp: , Rfl:    Ascorbic Acid (VITAMIN C) 1000 MG tablet, Take 1,000 mg by mouth daily., Disp: , Rfl:    aspirin EC 81 MG tablet, Take 1 tablet (81 mg total) by mouth daily. (Patient not taking: No sig reported), Disp: 90 tablet, Rfl: 3   atorvastatin (LIPITOR) 40 MG tablet, Take 40 mg by mouth daily., Disp: , Rfl:    brimonidine (ALPHAGAN) 0.2 % ophthalmic solution, Place 1 drop into both eyes 2 (two) times daily., Disp: , Rfl: 5   dorzolamide (TRUSOPT) 2 % ophthalmic solution, Place 1 drop into both eyes 2 (two) times daily., Disp: , Rfl: 5   insulin aspart  (NOVOLOG) 100 UNIT/ML injection, 0-15 Units, Subcutaneous, 3 times daily with meals CBG < 70: Implement Hypoglycemia measures CBG 70 - 120: 0 units CBG 121 - 150: 2 units CBG 151 - 200: 3 units CBG 201 - 250: 5 units CBG 251 - 300: 8 units CBG 301 - 350: 11 units CBG 351 - 400: 15 units CBG > 400: call MD, Disp: 10 mL, Rfl: 11   latanoprost (XALATAN) 0.005 % ophthalmic solution, Place 1 drop into the right eye at bedtime., Disp: 2.5 mL, Rfl: 12   lisinopril (ZESTRIL) 10 MG tablet, Take 10 mg by mouth daily., Disp: , Rfl:    metFORMIN (GLUCOPHAGE) 500 MG tablet, Take 500 mg by mouth 2 (two) times daily with a meal., Disp: , Rfl:    metoprolol tartrate (LOPRESSOR) 25 MG tablet, Take 25 mg by mouth 2 (two) times daily with a meal., Disp: , Rfl:    Multiple Vitamins-Minerals (CENTRUM SILVER) tablet, Take 1 tablet by mouth daily with breakfast., Disp: , Rfl:    pantoprazole (PROTONIX) 40 MG tablet, Take 40 mg by mouth daily before breakfast., Disp: , Rfl:    tamsulosin (FLOMAX) 0.4 MG CAPS capsule, Take 1 capsule (0.4 mg total) by mouth in the morning and at bedtime., Disp: 30 capsule, Rfl:   adenosine       aspirin  324 mg Oral Once    sodium chloride Stopped (03/19/21 1620)   sodium chloride      Physical Exam: Blood pressure (!) 83/60, pulse 100, temperature 98.2 F (36.8 C), temperature source Oral, resp. rate 18, SpO2 100 %.    Demted black male Torticollis  Lungs clear SEM soft benign Abdomen soft no obvious bladder distension No edema Bilateral LE weakness   Labs:   Lab Results  Component Value Date   WBC 11.1 (H) 03/19/2021   HGB 12.2 (L) 03/19/2021   HCT 38.1 (L) 03/19/2021   MCV 83.9 03/19/2021   PLT PLATELET CLUMPS NOTED ON SMEAR, UNABLE TO ESTIMATE 03/19/2021    Recent Labs  Lab 03/19/21 1412  NA 136  K 4.0  CL 108  CO2 19*  BUN 27*  CREATININE 1.14  CALCIUM 8.4*  GLUCOSE 208*   No results found for: CKTOTAL, CKMB, CKMBINDEX, TROPONINI No results found for:  CHOL No results found for: HDL No results found for: LDLCALC No results found for: TRIG No results found for: CHOLHDL No results found for: LDLDIRECT    Radiology: CT HEAD WO CONTRAST (5MM)  Result Date: 03/01/2021 CLINICAL DATA:  86 year old male with history of mental status change. Confusion. EXAM: CT HEAD WITHOUT CONTRAST TECHNIQUE: Contiguous axial images were obtained from the base of the skull through the vertex without intravenous contrast. COMPARISON:  No priors. FINDINGS: Brain:  Moderate cerebral and cerebellar atrophy. Patchy and confluent areas of decreased attenuation are noted throughout the deep and periventricular white matter of the cerebral hemispheres bilaterally, compatible with chronic microvascular ischemic disease. Physiologic calcifications in the left basal ganglia incidentally noted. No evidence of acute infarction, hemorrhage, hydrocephalus, extra-axial collection or mass lesion/mass effect. Vascular: No hyperdense vessel or unexpected calcification. Skull: Normal. Negative for fracture or focal lesion. Sinuses/Orbits: No acute finding. Other: None. IMPRESSION: 1. No acute intracranial abnormalities. 2. Moderate cerebral and cerebellar atrophy with chronic microvascular ischemic changes in the cerebral white matter, as above. Electronically Signed   By: Trudie Reed M.D.   On: 03/01/2021 07:13   DG Chest Portable 1 View  Result Date: 03/19/2021 CLINICAL DATA:  Breathing problems. EXAM: PORTABLE CHEST 1 VIEW COMPARISON:  03/01/2021 FINDINGS: Cardiac silhouette is normal in size. No mediastinal or hilar masses. Clear lungs.  No convincing pleural effusion.  No pneumothorax. Skeletal structures are grossly intact. IMPRESSION: No active disease. Electronically Signed   By: Amie Portland M.D.   On: 03/19/2021 14:51   DG Chest Port 1 View  Result Date: 03/01/2021 CLINICAL DATA:  Questionable sepsis EXAM: PORTABLE CHEST 1 VIEW COMPARISON:  None. FINDINGS: Cardiac and  mediastinal contours are within normal limits. No focal pulmonary opacity. No pleural effusion or pneumothorax. No acute osseous abnormality. Prior ACDF. IMPRESSION: No acute cardiopulmonary process. Electronically Signed   By: Wiliam Ke M.D.   On: 03/01/2021 01:53    EKG: SR PVCls nonspecific ST Changes   ASSESSMENT AND PLAN:   SVT:  resolved with adenosine currently NSR start low dose lopressor as BP allows. If not and has recurrent arrhythmia would use amiodarone  Elevated Troponin:  no acute ECG changes ? Demand ischemia in diabetic with rapid SVT. Trend troponins check echo Neuro:  Persistent LE weakness bed ridden unable to walk needs further PT/OT Hypotension: poor PO intake 48 hours hydrate NS 100 cc/hr Respiratory panel negative WBC 11.1 Hct 38.1 Lactic acid, BC;s and urine culture pending   Signed: Charlton Haws 03/19/2021, 4:24 PM

## 2021-03-19 NOTE — ED Provider Notes (Signed)
[facility] EMERGENCY DEPARTMENT Provider Note   CSN: [id number] Arrival date & time: 03/19/21  1355     History  Chief Complaint  Patient presents with   SVT    Michael Jensen is a 86 y.o. male.  HPI  Patient has a history of dementia, diabetes, essential hypertension but no known history of cardiac dysrhythmia who presents with tachycardia.  Patient was being visited by family members.  They noticed that his breathing seemed to be off.  Staff at his nursing facility checked his pulse and thought it was very low.  They called EMS.  When EMS arrived they noted on the monitor that he was actually very tachycardic.  He appeared to be in SVT.  Patient had an IV placed and ended up spontaneously converting.  Patient himself denies any trouble with chest pain or shortness of breath.  He was not aware that his heart was racing.  He states he feels fine  Home Medications Prior to Admission medications   Medication Sig Start Date End Date Taking? Authorizing Provider  acetaminophen (TYLENOL) 500 MG tablet Take 1,000 mg by mouth every 6 (six) hours as needed for mild pain or headache.    [provider]  albuterol (VENTOLIN HFA) 108 (90 Base) MCG/ACT inhaler 2 puffs as needed Patient not taking: No sig reported 08/04/19   [provider]  Ascorbic Acid (VITAMIN C) 1000 MG tablet Take 1,000 mg by mouth daily.    [provider]  aspirin EC 81 MG tablet Take 1 tablet (81 mg total) by mouth daily. Patient not taking: No sig reported 08/09/17   Billie Ruddy, MD  atorvastatin (LIPITOR) 40 MG tablet Take 40 mg by mouth daily.    [provider]  brimonidine (ALPHAGAN) 0.2 % ophthalmic solution Place 1 drop into both eyes 2 (two) times daily. 12/23/17   [provider]  dorzolamide (TRUSOPT) 2 % ophthalmic solution Place 1 drop into both eyes 2 (two) times daily. 10/22/17   [provider]  insulin aspart (NOVOLOG) 100 UNIT/ML  injection 0-15 Units, Subcutaneous, 3 times daily with meals CBG < 70: Implement Hypoglycemia measures CBG 70 - 120: 0 units CBG 121 - 150: 2 units CBG 151 - 200: 3 units CBG 201 - 250: 5 units CBG 251 - 300: 8 units CBG 301 - 350: 11 units CBG 351 - 400: 15 units CBG > 400: call MD 12/01/20   Jonetta Osgood, MD  latanoprost (XALATAN) 0.005 % ophthalmic solution Place 1 drop into the right eye at bedtime. 12/01/20   Ghimire, Henreitta Leber, MD  lisinopril (ZESTRIL) 10 MG tablet Take 10 mg by mouth daily.    [provider]  metFORMIN (GLUCOPHAGE) 500 MG tablet Take 500 mg by mouth 2 (two) times daily with a meal.    [provider]  metoprolol tartrate (LOPRESSOR) 25 MG tablet Take 25 mg by mouth 2 (two) times daily with a meal.    [provider]  Multiple Vitamins-Minerals (CENTRUM SILVER) tablet Take 1 tablet by mouth daily with breakfast.    [provider]  pantoprazole (PROTONIX) 40 MG tablet Take 40 mg by mouth daily before breakfast.    [provider]  tamsulosin (FLOMAX) 0.4 MG CAPS capsule Take 1 capsule (0.4 mg total) by mouth in the morning and at bedtime. 12/01/20   Ghimire, Henreitta Leber, MD      Allergies    Patient has no known allergies.  Review of Systems   Review of Systems  All other systems reviewed and are negative.  Physical Exam Updated Vital Signs BP (!) 88/34    Pulse (!) 104    Temp 98.2 F (36.8 C) (Oral)    Resp (!) 22    SpO2 100%  Physical Exam Vitals and nursing note reviewed.  Constitutional:      Appearance: He is well-developed. He is not diaphoretic.  HENT:     Head: Normocephalic and atraumatic.     Right Ear: External ear normal.     Left Ear: External ear normal.  Eyes:     General: No scleral icterus.       Right eye: No discharge.        Left eye: No discharge.     Conjunctiva/sclera: Conjunctivae normal.  Neck:     Trachea: No tracheal deviation.  Cardiovascular:     Rate and Rhythm: Regular rhythm.  Tachycardia present.  Pulmonary:     Effort: Pulmonary effort is normal. No respiratory distress.     Breath sounds: Normal breath sounds. No stridor. No wheezing or rales.  Abdominal:     General: Bowel sounds are normal. There is no distension.     Palpations: Abdomen is soft.     Tenderness: There is no abdominal tenderness. There is no guarding or rebound.  Musculoskeletal:        General: No tenderness or deformity.     Cervical back: Neck supple.  Skin:    General: Skin is warm and dry.     Findings: No rash.  Neurological:     General: No focal deficit present.     Mental Status: He is alert.     Cranial Nerves: No cranial nerve deficit (no facial droop, extraocular movements intact, no slurred speech).     Sensory: No sensory deficit.     Motor: No abnormal muscle tone or seizure activity.     Coordination: Coordination normal.  Psychiatric:        Mood and Affect: Mood normal.    ED Results / Procedures / Treatments   Labs (all labs ordered are listed, but only abnormal results are displayed) Labs Reviewed  BASIC METABOLIC PANEL - Abnormal; Notable for the following components:      Result Value   CO2 19 (*)    Glucose, Bld 208 (*)    BUN 27 (*)    Calcium 8.4 (*)    All other components within normal limits  CBC - Abnormal; Notable for the following components:   WBC 11.1 (*)    Hemoglobin 12.2 (*)    HCT 38.1 (*)    RDW 16.0 (*)    All other components within normal limits  MAGNESIUM - Abnormal; Notable for the following components:   Magnesium 1.6 (*)    All other components within normal limits  CBG MONITORING, ED - Abnormal; Notable for the following components:   Glucose-Capillary 194 (*)    All other components within normal limits  TROPONIN I (HIGH SENSITIVITY) - Abnormal; Notable for the following components:   Troponin I (High Sensitivity) 1,150 (*)    All other components within normal limits  RESP PANEL BY RT-PCR (FLU A&B, COVID) [id number]   CULTURE, BLOOD (ROUTINE X 2)  CULTURE, BLOOD (ROUTINE X 2)  URINE CULTURE  TSH  LACTIC ACID, PLASMA  LACTIC ACID, PLASMA  URINALYSIS, ROUTINE W REFLEX MICROSCOPIC  APTT  HEPARIN LEVEL (UNFRACTIONATED)  PROTIME-INR  TROPONIN I (HIGH SENSITIVITY)  EKG EKG Interpretation   Date/Time:  Sunday March 19 2021 14:07:18 EST Ventricular Rate:  212 PR Interval:    QRS Duration: 75 QT Interval:  210 QTC Calculation: 395 R Axis:   119 Text Interpretation: Supraventricular tachycardia Sinus pause Right axis deviation Low voltage, precordial leads Repolarization abnormality, prob rate related svt new since last tracing Confirmed by Dorie Rank [phone]) on 03/19/2021 2:20:48 PM  Radiology DG Chest Portable 1 View  Result Date: 03/19/2021 CLINICAL DATA:  Breathing problems. EXAM: PORTABLE CHEST 1 VIEW COMPARISON:  03/01/2021 FINDINGS: Cardiac silhouette is normal in size. No mediastinal or hilar masses. Clear lungs.  No convincing pleural effusion.  No pneumothorax. Skeletal structures are grossly intact. IMPRESSION: No active disease. Electronically Signed   By: Lajean Manes M.D.   On: 03/19/2021 14:51    Procedures .Critical Care Performed by: Dorie Rank, MD Authorized by: Dorie Rank, MD   Critical care provider statement:    Critical care time (minutes):  50   Critical care was time spent personally by me on the following activities:  Development of treatment plan with patient or surrogate, discussions with consultants, evaluation of patient's response to treatment, examination of patient, ordering and review of laboratory studies, ordering and review of radiographic studies, ordering and performing treatments and interventions, pulse oximetry, re-evaluation of patient's condition and review of old charts .1-3 Lead EKG Interpretation Performed by: Dorie Rank, MD Authorized by: Dorie Rank, MD     Interpretation: normal     ECG rate:  100   ECG rate assessment: tachycardic     Rhythm:  sinus rhythm     Ectopy: none     Conduction: normal      Medications Ordered in ED Medications  0.9 %  sodium chloride infusion (0 mLs Intravenous Stopped 03/19/21 1620)  adenosine (ADENOCARD) 6 MG/2ML injection (12 mg Intravenous Given 03/19/21 1409)  adenosine (ADENOCARD) 6 MG/2ML injection (  Not Given 03/19/21 1444)  sodium chloride 0.9 % bolus 500 mL (0 mLs Intravenous Stopped 03/19/21 1620)    Followed by  0.9 %  sodium chloride infusion (1,000 mLs Intravenous New Bag/Given 03/19/21 1626)  adenosine (ADENOCARD) 6 MG/2ML injection (6 mg  Given 03/19/21 1405)  aspirin chewable tablet 324 mg (324 mg Oral Given 03/19/21 1622)  sodium chloride 0.9 % bolus 500 mL (0 mLs Intravenous Stopped 03/19/21 1531)    ED Course/ Medical Decision Making/ A&P Clinical Course as of 03/19/21 [id number]  Sun Mar 19, 2021  1519 Blood pressure is remaining in the low 90s even after he has converted back to sinus rhythm. [JK]  1520 No signs of infection at this time but will add on lactic acid level continue with fluid hydration [JK]  1522 DG Chest Portable 1 View Chest x-ray images and report reviewed.  No acute pneumonia [JK]  1623 Case discussed with Dr Johnsie Cancel cardiology.  Does not recommend aggressive intervention.  Will consult on pt.  Requests medical admission [JK]  1644 Discussed with Dr. Lorin Mercy, hospitalist service.  Will admit for further treatment [JK]    Clinical Course User Index [JK] Dorie Rank, MD                           Medical Decision Making  Patient presented to the ED for evaluation of tachycardia.  Patient noted to have narrow complex tachycardia consistent with SVT.  He had a heart rate up in the 220s when he initially arrived.  Patient  was also noted to be hypotensive.  He was given 6 mg of adenosine and then 12 mg adenosine.  He did have conversion to normal sinus rhythm after the 6 mg dose however he shortly thereafter went right back into the SVT.  Patient has remained in sinus rhythm after the  12 mg dose.  He denies any chest pain or shortness of breath but continued to have borderline blood pressures.  Patient was given IV fluid bolus.  No definite signs of acute infection however had add on lactic acid level and cultures.  Patient does have elevated troponin of 1150.  Its possible his hypotension is related to cardiac injury from the rapid rate.  We will consult with the cardiology service for admission and further treatment  Case was reviewed with the patient's family.  Additional history was obtained from them. Patient does currently reside in a nursing facility       Final Clinical Impression(s) / ED Diagnoses Final diagnoses:  NSTEMI (non-ST elevated myocardial infarction) ([place])  SVT (supraventricular tachycardia) ([place])  Hypotension, unspecified hypotension type     Dorie Rank, MD 03/19/21 1644

## 2021-03-19 NOTE — H&P (Signed)
History and Physical    Patient: Michael Jensen Mikulski [id number] DOB: 1936-02-27 DOA: 03/19/2021 DOS: the patient was seen and examined on 03/19/2021 PCP: Soundra PilonBrake, Andrew R, FNP  Patient coming from: SNF - [facility]; NOK: Daughter, [phone]  Chief Complaint: SVT (per EMS)  HPI: Michael Jensen Golladay is a 86 y.o. male with medical history significant of BPH with chronic indwelling foley; glaucoma with visual impairment; DM; C-spine stenosis with inability to ambulate; dementia; HTN; and HLD presenting with SVT.  Famiyl was with him at the facility this AM.  They brought him food.  He seemed to be breathing rapidly and so his daughter went to get assistance.  The aide noted that his heart rate was low.  They called 911 and EMS reported his HR was very fast.  He has not actually felt bad.  The facility has reported that he hasn't been eating well and he had loose stools 4 times yesterday.  No n/v.  No diarrhea today.  No recent antibiotics.    ER Course:  Called cardiology to admit, Dr. Eden EmmsNishan agreed to consult.  Here with SVT.  Family was there at SNF, had no complaints but they noticed mild breathing difficulty.  Nursing staff called 911.  EMS noted SVT, ?medications.  He converted and then reverted back to 220s with SVT.  Given Adenosine, converted briefly, went back into SVT.  Given 12 mg adenosine and has been in NSR since.  Hypotensive during SVT, staying borderline, currently 91/51.  No fever, but added lactate.  COVID/flu negative.  WBC 11.  Troponin 1150.    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 He has never used smokeless tobacco. He reports that he does not currently use alcohol. He reports that he does not use drugs.  No Known Allergies  History reviewed. No pertinent family history.  Prior to Admission medications   Medication Sig Start Date End Date Taking? Authorizing Provider  acetaminophen (TYLENOL) 500 MG tablet Take 1,000 mg by mouth every 6 (six) hours as needed for mild pain or headache.    [provider]  albuterol (VENTOLIN HFA) 108 (90 Base) MCG/ACT inhaler 2 puffs as needed Patient not taking: No sig reported 08/04/19   [provider]  Ascorbic Acid (VITAMIN C) 1000 MG tablet Take 1,000 mg by mouth daily.    [provider]  aspirin EC 81 MG tablet Take 1 tablet (81 mg total) by mouth daily. Patient not taking: No sig reported 08/09/17   Deeann SaintBanks, Shannon R, MD  atorvastatin (LIPITOR) 40 MG tablet Take 40 mg by mouth daily.    [provider]  brimonidine (ALPHAGAN) 0.2 % ophthalmic solution Place 1 drop into both eyes 2 (two) times daily. 12/23/17   [provider]  dorzolamide (TRUSOPT) 2 % ophthalmic solution Place 1 drop into both eyes 2 (two)  times daily. 10/22/17   [provider]  insulin aspart (NOVOLOG) 100 UNIT/ML injection 0-15 Units, Subcutaneous, 3 times daily with meals CBG < 70: Implement Hypoglycemia measures CBG 70 - 120: 0 units CBG 121 - 150: 2 units CBG 151 - 200: 3 units CBG 201 - 250: 5 units CBG 251 - 300: 8 units CBG 301 - 350: 11 units CBG 351 - 400: 15 units CBG > 400: call MD 12/01/20   Maretta Bees, MD  latanoprost (XALATAN) 0.005 % ophthalmic solution Place 1 drop into the right eye at bedtime. 12/01/20   Ghimire, Werner Lean, MD  lisinopril (ZESTRIL)  10 MG tablet Take 10 mg by mouth daily.    [provider]  metFORMIN (GLUCOPHAGE) 500 MG tablet Take 500 mg by mouth 2 (two) times daily with a meal.    [provider]  metoprolol tartrate (LOPRESSOR) 25 MG tablet Take 25 mg by mouth 2 (two) times daily with a meal.    [provider]  Multiple Vitamins-Minerals (CENTRUM SILVER) tablet Take 1 tablet by mouth daily with breakfast.    [provider]  pantoprazole (PROTONIX) 40 MG tablet Take 40 mg by mouth daily before breakfast.    [provider]  tamsulosin (FLOMAX) 0.4 MG CAPS capsule Take 1 capsule (0.4 mg total) by mouth in the morning and at bedtime. 12/01/20   Maretta Bees, MD    Physical Exam: Vitals:   03/19/21 1645 03/19/21 1650 03/19/21 1655 03/19/21 1705  BP: (!) 88/53 (!) 92/58 (!) 92/43   Pulse: 95 97 (!) 101   Resp: 17 (!) 22 15   Temp:      TempSrc:      SpO2: 100% 100% 98%   Weight:    84 kg  Height:    5\' 10"  (1.778 m)   General:  Appears calm and comfortable and is in NAD; chronically ill/debilitated Eyes:  +chronic visual impairment ENT:  grossly normal hearing, lips & tongue, mmm Neck:  no LAD, masses or thyromegaly Cardiovascular:  RR with mild tachycardia, no m/r/g. No LE edema.  Respiratory:   CTA bilaterally with no wheezes/rales/rhonchi.  Normal to mildly increased respiratory effort. Abdomen:  soft, NT, ND Skin:  no rash or induration seen on limited exam; healing wound now stage 1-2 on R heel Musculoskeletal:  decreased tone BUE/BLE, no bony abnormality Psychiatric:  blunted mood and affect, speech sparse but appropriate, AOx1-2 Neurologic:  unable to effectively perform   Radiological Exams on Admission: Independently reviewed - see discussion in A/P where applicable  DG Chest Portable 1 View  Result Date: 03/19/2021 CLINICAL DATA:  Breathing problems. EXAM: PORTABLE CHEST 1 VIEW COMPARISON:  03/01/2021 FINDINGS: Cardiac silhouette is normal in size.  No mediastinal or hilar masses. Clear lungs.  No convincing pleural effusion.  No pneumothorax. Skeletal structures are grossly intact. IMPRESSION: No active disease. Electronically Signed   By: 03/03/2021 M.D.   On: 03/19/2021 14:51    EKG: Independently reviewed.   1407 - SVT with rate 212; sinus pause 1410 - Sinus tachycardia with rate 113; low voltage with no evidence of acute ischemia 1621 - Sinus tachycardia with rate 101; low voltage with no evidence of acute ischemia   Labs on Admission: I have personally reviewed the available labs and imaging studies at the time of the admission.  Pertinent labs:    CO2 19 Glucose 208 BUN 27/Creatinine 1.14/GFR >60 Mag++ 1.6 Troponin 1150 Unremarkable CBC, platelets clumped TSH 1.908  COVID/flu negative    Assessment/Plan * SVT (supraventricular tachycardia) (HCC)- (present on admission) -Patient presenting with asymptomatic SVT, incidentally diagnosed when family noticed that he appeared to be breathing funny -2 episodes, HR >200, responded to Adenosine 6 -> 12 mg and currently in sinus or with mild sinus tachycardia -Patient comfortable and asymptomatic throughout -Potential etiologies include infection (had diarrhea yesterday, resolved); dehydration; MI; or other -Will observe overnight on telemetry in progressive care unit -Cardiology is managing cardiac issues -Echo ordered by cardiology -Markedly elevated troponin, trending; this could be related to ischemia (he is a very poor candidate for intervention) or demand in the setting of periodic SVT of unknown duration -He has been started on heparin -Of note, apixiban is listed on his prior med rec (not completed as of now with updates) but it is not clear why he would have been taking this by cursory review of prior recent hospitalizations/notes -With ambulatory dysfunction and dementia, his daughter is very concerned about quality of life and appears to be seriously considering DNR  and maybe even comfort if cause for SVT is not identified  Elevated lactic acid level- (present on admission) -Some concern for sepsis with SVT, hypotension -Lactate is markedly elevated -However, this may all be related to hypoperfusion in the setting of marked SVT -Will trend lactate -UA is pending -Blood and urine cultures are pending -He did have diarrhea x 4 episodes yesterday but none since -For right now, will hold antibiotics and continue to follow but would have a low threshold to start if lactate is not downtrending, fever develops, or there are new concerns    Essential hypertension- (present on admission) -Hypotensive during SVT and soft BPs (90/60s) since -Likely associated with hypoperfusion but sepsis is a consideration  -Will continue gentle IVF -Resume BB in AM if BP can tolerate -Hold Lisinopril  Ambulatory dysfunction- (present on admission) -He is not ambulatory and has not walked in maybe a year -Will need to return to SNF at the time of dc  Cognitive impairment- (present on admission) -Chronic dementia -Does not appear to be taking medication for this issue -Delirium precautions ordered  Mixed hyperlipidemia- (present on admission) -Continue Lipitor  Glaucoma- (present on admission) -Legally blind -Continue drops - latanoprost, dorzolamide, brimonidine  Benign prostatic hyperplasia without lower urinary tract symptoms- (present on admission) -Previously had indwelling foley, none currently -Continue Flomax with resumption in AM if BP will tolerate    Advance Care Planning:   Code Status: Full Code   Consults: Cardiology  Family Communication: Daughter and grandson were present throughout evaluation  Severity of Illness: The appropriate patient status for this patient is OBSERVATION. Observation status is judged to be reasonable and necessary in order to provide the required intensity of service to ensure the patient's safety. The patient's  presenting symptoms, physical exam findings, and initial radiographic and laboratory data in the context of their medical condition is felt to place them at decreased risk for further clinical deterioration. Furthermore, it is anticipated that the patient will be medically stable for discharge from the hospital within 2 midnights of admission.   Author: Jonah Blue 03/19/2021 6:03 PM  For on call review [email or web address].

## 2021-03-19 NOTE — Code Documentation (Signed)
Pt in sinus tachycardia w/ frequent multifocal PVC's

## 2021-03-19 NOTE — Assessment & Plan Note (Signed)
-  Previously had indwelling foley, none currently -Continue Flomax with resumption in AM if BP will tolerate

## 2021-03-19 NOTE — ED Triage Notes (Signed)
Pt here via [facility] from [facility]. Ems was called for breathing problems, and a pulse reading of 39. Upon EMS arrival pt had rapid thready radial pulse, 12lead showed SVT. Ems went to start and IV and pt self-converted into ST w/ PVCs. 18g RAC, NS. Pt has dementia and is legally blind.

## 2021-03-19 NOTE — Assessment & Plan Note (Addendum)
-  Chronic dementia -Does not appear to be taking medication for this issue -Delirium precautions ordered

## 2021-03-19 NOTE — Progress Notes (Addendum)
ANTICOAGULATION CONSULT NOTE - Initial Consult  Pharmacy Consult for Heparin Indication: chest pain/ACS  No Known Allergies  Patient Measurements:   Heparin Dosing Weight: 84 kg  Vital Signs: Temp: 98.2 F (36.8 C) (01/01 1401) Temp Source: Oral (01/01 1401) BP: 92/50 (01/01 1530) Pulse Rate: 79 (01/01 1530)  Labs: Recent Labs    03/19/21 1412  HGB 12.2*  HCT 38.1*  PLT PLATELET CLUMPS NOTED ON SMEAR, UNABLE TO ESTIMATE  CREATININE 1.14  TROPONINIHS 1,150*    CrCl cannot be calculated (Unknown ideal weight.).   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 Pt found to be in SVT in the ED and with elevated troponin. Heparin per pharmacy consult placed for chest pain/ACS.  Patient is on eliquis prior to arrival. Last dose 1/1 ~0900. Will require aPTT monitoring due to likely falsely high anti-Xa level secondary to DOAC use.  Baseline aPTT and heparin level pending Hgb 12.2; plt clumping  Goal of Therapy:  Heparin level 0.3-0.7 units/ml aPTT 66-102 seconds Monitor platelets by anticoagulation protocol: Yes   Plan:  No initial heparin bolus Start heparin infusion at 1100 units/hr at 2100 tonight Check aPTT & anti-Xa level in 8 hours and daily while on heparin Continue to monitor via aPTT until levels are correlated Continue to monitor H&H and platelets  Delmar Landau, PharmD,  BCPS 03/19/2021 4:04 PM ED Clinical Pharmacist -  [phone]

## 2021-03-20 ENCOUNTER — Inpatient Hospital Stay (HOSPITAL_COMMUNITY): Payer: Medicare HMO

## 2021-03-20 DIAGNOSIS — E782 Mixed hyperlipidemia: Secondary | ICD-10-CM | POA: Diagnosis present

## 2021-03-20 DIAGNOSIS — Z66 Do not resuscitate: Secondary | ICD-10-CM | POA: Diagnosis not present

## 2021-03-20 DIAGNOSIS — H409 Unspecified glaucoma: Secondary | ICD-10-CM | POA: Diagnosis present

## 2021-03-20 DIAGNOSIS — R609 Edema, unspecified: Secondary | ICD-10-CM

## 2021-03-20 DIAGNOSIS — Z86718 Personal history of other venous thrombosis and embolism: Secondary | ICD-10-CM | POA: Diagnosis not present

## 2021-03-20 DIAGNOSIS — H548 Legal blindness, as defined in USA: Secondary | ICD-10-CM | POA: Diagnosis present

## 2021-03-20 DIAGNOSIS — F0390 Unspecified dementia without behavioral disturbance: Secondary | ICD-10-CM | POA: Diagnosis present

## 2021-03-20 DIAGNOSIS — I471 Supraventricular tachycardia: Secondary | ICD-10-CM

## 2021-03-20 DIAGNOSIS — F03B0 Unspecified dementia, moderate, without behavioral disturbance, psychotic disturbance, mood disturbance, and anxiety: Secondary | ICD-10-CM | POA: Diagnosis not present

## 2021-03-20 DIAGNOSIS — R4189 Other symptoms and signs involving cognitive functions and awareness: Secondary | ICD-10-CM | POA: Diagnosis present

## 2021-03-20 DIAGNOSIS — K219 Gastro-esophageal reflux disease without esophagitis: Secondary | ICD-10-CM | POA: Diagnosis present

## 2021-03-20 DIAGNOSIS — Z7401 Bed confinement status: Secondary | ICD-10-CM | POA: Diagnosis not present

## 2021-03-20 DIAGNOSIS — Z515 Encounter for palliative care: Secondary | ICD-10-CM

## 2021-03-20 DIAGNOSIS — R627 Adult failure to thrive: Secondary | ICD-10-CM | POA: Diagnosis present

## 2021-03-20 DIAGNOSIS — Z7982 Long term (current) use of aspirin: Secondary | ICD-10-CM | POA: Diagnosis not present

## 2021-03-20 DIAGNOSIS — E860 Dehydration: Secondary | ICD-10-CM | POA: Diagnosis present

## 2021-03-20 DIAGNOSIS — M4802 Spinal stenosis, cervical region: Secondary | ICD-10-CM | POA: Diagnosis present

## 2021-03-20 DIAGNOSIS — Z7189 Other specified counseling: Secondary | ICD-10-CM | POA: Diagnosis not present

## 2021-03-20 DIAGNOSIS — M1990 Unspecified osteoarthritis, unspecified site: Secondary | ICD-10-CM | POA: Diagnosis present

## 2021-03-20 DIAGNOSIS — N401 Enlarged prostate with lower urinary tract symptoms: Secondary | ICD-10-CM | POA: Diagnosis present

## 2021-03-20 DIAGNOSIS — Z87891 Personal history of nicotine dependence: Secondary | ICD-10-CM | POA: Diagnosis not present

## 2021-03-20 DIAGNOSIS — I248 Other forms of acute ischemic heart disease: Secondary | ICD-10-CM | POA: Diagnosis present

## 2021-03-20 DIAGNOSIS — E1142 Type 2 diabetes mellitus with diabetic polyneuropathy: Secondary | ICD-10-CM | POA: Diagnosis present

## 2021-03-20 DIAGNOSIS — I10 Essential (primary) hypertension: Secondary | ICD-10-CM | POA: Diagnosis present

## 2021-03-20 DIAGNOSIS — I959 Hypotension, unspecified: Secondary | ICD-10-CM | POA: Diagnosis present

## 2021-03-20 DIAGNOSIS — Z20822 Contact with and (suspected) exposure to covid-19: Secondary | ICD-10-CM | POA: Diagnosis present

## 2021-03-20 LAB — CBC
HCT: 35.4 % — ABNORMAL LOW (ref 39.0–52.0)
Hemoglobin: 11.3 g/dL — ABNORMAL LOW (ref 13.0–17.0)
MCH: 26.2 pg (ref 26.0–34.0)
MCHC: 31.9 g/dL (ref 30.0–36.0)
MCV: 82.1 fL (ref 80.0–100.0)
Platelets: 102 10*3/uL — ABNORMAL LOW (ref 150–400)
RBC: 4.31 MIL/uL (ref 4.22–5.81)
RDW: 16 % — ABNORMAL HIGH (ref 11.5–15.5)
WBC: 11 10*3/uL — ABNORMAL HIGH (ref 4.0–10.5)
nRBC: 0 % (ref 0.0–0.2)

## 2021-03-20 LAB — GLUCOSE, CAPILLARY
Glucose-Capillary: 162 mg/dL — ABNORMAL HIGH (ref 70–99)
Glucose-Capillary: 165 mg/dL — ABNORMAL HIGH (ref 70–99)
Glucose-Capillary: 167 mg/dL — ABNORMAL HIGH (ref 70–99)
Glucose-Capillary: 215 mg/dL — ABNORMAL HIGH (ref 70–99)
Glucose-Capillary: 248 mg/dL — ABNORMAL HIGH (ref 70–99)

## 2021-03-20 LAB — BASIC METABOLIC PANEL
Anion gap: 9 (ref 5–15)
BUN: 23 mg/dL (ref 8–23)
CO2: 23 mmol/L (ref 22–32)
Calcium: 8.6 mg/dL — ABNORMAL LOW (ref 8.9–10.3)
Chloride: 106 mmol/L (ref 98–111)
Creatinine, Ser: 0.76 mg/dL (ref 0.61–1.24)
GFR, Estimated: >60 mL/min (ref >60)
Glucose, Bld: 173 mg/dL — ABNORMAL HIGH (ref 70–99)
Potassium: 3.9 mmol/L (ref 3.5–5.1)
Sodium: 138 mmol/L (ref 135–145)

## 2021-03-20 LAB — APTT: aPTT: 62 seconds — ABNORMAL HIGH (ref 24–36)

## 2021-03-20 LAB — ECHOCARDIOGRAM COMPLETE
AR max vel: 2.7 cm2
AV Area VTI: 2.96 cm2
AV Area mean vel: 2.78 cm2
AV Mean grad: 4.7 mmHg
AV Peak grad: 8.4 mmHg
Ao pk vel: 1.45 m/s
Area-P 1/2: 3.42 cm2
Height: 70 in
S' Lateral: 2.9 cm
Weight: 3104.08 oz

## 2021-03-20 LAB — HEPARIN LEVEL (UNFRACTIONATED): Heparin Unfractionated: >1.1 IU/mL — ABNORMAL HIGH (ref 0.30–0.70)

## 2021-03-20 MED ORDER — APIXABAN 5 MG PO TABS
10.0000 mg | ORAL_TABLET | Freq: Two times a day (BID) | ORAL | Status: DC
  Administered 2021-03-20 – 2021-03-21 (×3): 10 mg via ORAL
  Filled 2021-03-20 (×3): qty 2

## 2021-03-20 MED ORDER — CYCLOBENZAPRINE HCL 10 MG PO TABS
10.0000 mg | ORAL_TABLET | Freq: Three times a day (TID) | ORAL | Status: DC
  Administered 2021-03-20 – 2021-03-21 (×3): 10 mg via ORAL
  Filled 2021-03-20 (×3): qty 1

## 2021-03-20 MED ORDER — DOCUSATE SODIUM 100 MG PO CAPS
100.0000 mg | ORAL_CAPSULE | Freq: Two times a day (BID) | ORAL | Status: DC
  Administered 2021-03-20 – 2021-03-21 (×3): 100 mg via ORAL
  Filled 2021-03-20 (×3): qty 1

## 2021-03-20 MED ORDER — FINASTERIDE 5 MG PO TABS
5.0000 mg | ORAL_TABLET | Freq: Every morning | ORAL | Status: DC
  Administered 2021-03-21: 5 mg via ORAL
  Filled 2021-03-20: qty 1

## 2021-03-20 MED ORDER — MAGNESIUM SULFATE 2 GM/50ML IV SOLN
2.0000 g | Freq: Once | INTRAVENOUS | Status: AC
  Administered 2021-03-20: 2 g via INTRAVENOUS
  Filled 2021-03-20: qty 50

## 2021-03-20 MED ORDER — ADULT MULTIVITAMIN W/MINERALS CH
1.0000 | ORAL_TABLET | Freq: Every morning | ORAL | Status: DC
  Administered 2021-03-21: 1 via ORAL
  Filled 2021-03-20: qty 1

## 2021-03-20 MED ORDER — APIXABAN 5 MG PO TABS: 5.0000 mg | ORAL_TABLET | Freq: Two times a day (BID) | ORAL | Status: DC

## 2021-03-20 NOTE — Progress Notes (Signed)
*  PRELIMINARY RESULTS* Echocardiogram 2D Echocardiogram has been performed.  Michael Jensen 03/20/2021, 1:26 PM

## 2021-03-20 NOTE — Progress Notes (Signed)
Palliative-  Consult received, chart reviewed. Called patient's daughter- left message req return call to schedule meeting.   Ocie Bob, AGNP-C Palliative Medicine  No charge

## 2021-03-20 NOTE — TOC Progression Note (Signed)
Transition of Care [facility]) - Progression Note    Patient Details  Name: Tayton Antill MRN: [id number] Date of Birth: 1935/12/20  Transition of Care [facility]) CM/SW Contact  Zenon Mayo, RN Phone Number: 03/20/2021, 3:22 PM  Clinical Narrative:     Transition of Care [facility]) Screening Note   Patient Details  Name: Korvin Woodcock Date of Birth: [birth year]   Transition of Care [facility]) CM/SW Contact:    Zenon Mayo, RN Phone Number: 03/20/2021, 3:22 PM    Transition of Care Department [facility]) has reviewed patient and no TOC needs have been identified at this time. We will continue to monitor patient advancement through interdisciplinary progression rounds. If new patient transition needs arise, please place a TOC consult.          Expected Discharge Plan and Services                                                 Social Determinants of Health (SDOH) Interventions    Readmission Risk Interventions No flowsheet data found.

## 2021-03-20 NOTE — TOC Initial Note (Signed)
Transition of Care [facility]) - Initial/Assessment Note    Patient Details  Name: Michael Jensen MRN: [id number] Date of Birth: 1935-05-10  Transition of Care [facility]) CM/SW Contact:    Lynett Grimes Phone Number: 03/20/2021, 5:40 PM  Clinical Narrative:                 PT is from [facility] long term and will likely return tomorrow with Palliative or Hospice services. CSW will follow up with Wille Celeste at [place] tomorrow when she returns for pt DC plan.        Patient Goals and CMS Choice        Expected Discharge Plan and Services                                                Prior Living Arrangements/Services                       Activities of Daily Living Home Assistive Devices/Equipment: None ADL Screening (condition at time of admission) Patient's cognitive ability adequate to safely complete daily activities?: No Is the patient deaf or have difficulty hearing?: Yes Does the patient have difficulty seeing, even when wearing glasses/contacts?: Yes Does the patient have difficulty concentrating, remembering, or making decisions?: Yes Patient able to express need for assistance with ADLs?: Yes Does the patient have difficulty dressing or bathing?: Yes Independently performs ADLs?: No Communication: Independent Dressing (OT): Needs assistance Is this a change from baseline?: Pre-admission baseline Grooming: Needs assistance Is this a change from baseline?: Pre-admission baseline Feeding: Independent Bathing: Needs assistance Is this a change from baseline?: Pre-admission baseline Toileting: Needs assistance Is this a change from baseline?: Pre-admission baseline In/Out Bed: Dependent Is this a change from baseline?: Pre-admission baseline Walks in Home: Dependent Is this a change from baseline?: Pre-admission baseline Does the patient have difficulty walking or climbing stairs?: Yes Weakness of Legs: Both Weakness of Arms/Hands:  None  Permission Sought/Granted                  Emotional Assessment              Admission diagnosis:  SVT (supraventricular tachycardia) (HCC) [I47.1] NSTEMI (non-ST elevated myocardial infarction) (HCC) [I21.4] Hypotension, unspecified hypotension type [I95.9] Patient Active Problem List   Diagnosis Date Noted   SVT (supraventricular tachycardia) (HCC) 03/19/2021   Elevated lactic acid level 03/19/2021   Ambulatory dysfunction 03/19/2021   Cognitive impairment 03/01/2021   Legal blindness, as defined in [place] 03/01/2021   Recurrent falls 03/01/2021   Chronic indwelling Foley catheter 03/01/2021   Acute urinary retention 11/25/2020   Spinal stenosis of lumbar region at multiple levels 11/25/2020   Spinal stenosis, cervicothoracic region 11/25/2020   Benign prostatic hyperplasia without lower urinary tract symptoms 03/04/2020   Diabetic peripheral neuropathy associated with type 2 diabetes mellitus (HCC) 03/04/2020   Essential hypertension 03/04/2020   Gastroesophageal reflux disease without esophagitis 03/04/2020   Glaucoma 03/04/2020   Mixed hyperlipidemia 03/04/2020   Osteoarthritis of knee 03/04/2020   Thrombocytopenia (HCC) 03/04/2020   PCP:  Soundra Pilon, FNP Pharmacy:   [facility] [address] [place] [address] Phone: [phone] Fax: [phone]     Social Determinants of Health (SDOH) Interventions    Readmission Risk Interventions No flowsheet data found.

## 2021-03-20 NOTE — Consult Note (Signed)
Consultation Note Date: 03/20/2021   Patient Name: Michael Jensen  DOB: Jan 26, 1936  MRN: [id number]  Age / Sex: 86 y.o., male  PCP: Kristen Loader, FNP Referring Physician: Geradine Girt, DO  Reason for Consultation: GOC  HPI/Patient Profile: 86 y.o. male  with past medical history of HTN, HLD, BPH with urinary retention, DM, peripheral neuropathy, severe spinal stenosis, s/p C7-T1 disc fusion in September 0938, complicated UTIs, glaucoma, blindness, dementia admitted on 03/19/2021 with rapid heart rate and shortness of breath. Workup thus far reveals SVT that has resolved with adenosine and metoprolol. Palliative medicine consulted for [place].   Primary Decision Maker NEXT OF KIN- patient's daughter Sharyn Lull  Discussion: I have reviewed medical records including EPIC notes, labs- CBC, BMP, lactic acid, troponins,  and imaging- ECHO, chest xray, assessed the patient and then met at the bedside along with his daughter Sharyn Lull and granddaughter Corey Skains to discuss diagnosis prognosis, [place], EOL wishes, disposition and options.  I introduced Palliative Medicine as specialized medical care for people living with serious illness. It focuses on providing relief from the symptoms and stress of a serious illness. The goal is to improve quality of life for both the patient and the family.  We discussed a brief life review of the patient and then focused on their current illness. The natural disease trajectory and expectations at EOL were discussed.  Mr. Jelley has had significant decline since his surgery for spinal stenosis in September. He is now bed bound and cannot ambulate. He requires full assistance for all ADL's. His po intake has significantly decreased.  I attempted to elicit values and goals of care important to the patient. He was known to be very intelligent, proud and independent. Family shares that his quality  of life has diminished significantly since September and continues to decline.   Advanced directives, concepts specific to code status, artifical feeding and hydration, and rehospitalization were considered and discussed.   Family agrees that a DNR order aligns with patient's goals of care. They would not want him to return to the hospital if he declined further- rather they would prefer for him to be kept comfortable and be allowed to transition with dignity through end of life.   Hospice and Palliative Care services outpatient were explained and offered. Family would like him to return to his SNF with hospice support.   Discussed the importance of continued conversation with family and the medical providers regarding overall plan of care and treatment options, ensuring decisions are within the context of the patients values and GOCs.    Questions and concerns were addressed.  Hard Choices booklet left for review. The family was encouraged to call with questions or concerns.  PMT will continue to support.    SUMMARY OF RECOMMENDATIONS -Failure to thrive in the setting of mod dementia and spinal stenosis with major declines in nutrition, function, and cognition in the last three months resulting in poor quality of life -SVT- controlled with current interventions, family open to pursuing what workup is  needed during current admission to find cause, however, would like him to return to SNF as soon as possible and with Hospice support -DNR order entered -TOC referral for hospice at SNF   Code Status/Advance Care Planning: DNR   Prognosis:   < 6 months  Discharge Planning: [place] with Hospice  Primary Diagnoses: Present on Admission:  SVT (supraventricular tachycardia) ([place])  Benign prostatic hyperplasia without lower urinary tract symptoms  Cognitive impairment  Essential hypertension  Glaucoma  Mixed hyperlipidemia  Elevated lactic acid level  Ambulatory  dysfunction   Review of Systems  Unable to perform ROS: Dementia   Physical Exam Vitals and nursing note reviewed.  Constitutional:      Appearance: He is ill-appearing.     Comments: frail  Skin:    General: Skin is warm and dry.  Neurological:     Mental Status: He is disoriented.    Vital Signs: BP 107/75    Pulse 99    Temp 99.1 F (37.3 C) (Oral)    Resp 17    Ht 5' 10"  (1.778 m)    Wt 88 kg    SpO2 98%    BMI 27.84 kg/m  Pain Scale: 0-10   Pain Score: 0-No pain   SpO2: SpO2: 98 % O2 Device:SpO2: 98 % O2 Flow Rate: .O2 Flow Rate (L/min): 2 L/min  IO: Intake/output summary:  Intake/Output Summary (Last 24 hours) at 03/20/2021 1702 Last data filed at 03/20/2021 0608 Gross per 24 hour  Intake 1678.56 ml  Output 300 ml  Net 1378.56 ml    LBM:   Baseline Weight: Weight: 84 kg Most recent weight: Weight: 88 kg     Palliative Assessment/Data: PPS: 30%       Thank you for this consult. Palliative medicine will continue to follow and assist as needed.   Time Total: 140 minutes   Signed by: Mariana Kaufman, AGNP-C Palliative Medicine    Please contact Palliative Medicine Team phone at [phone] for questions and concerns.  For individual provider: See Shea Evans

## 2021-03-20 NOTE — Progress Notes (Addendum)
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   ANTICOAGULATION CONSULT NOTE - Follow up Consult  Pharmacy Consult for Heparin Indication: chest pain/ACS  No Known Allergies  Patient Measurements: Height: 5\' 10"  (177.8 cm) Weight: 88 kg (194 lb 0.1 oz) IBW/kg (Calculated) : 73 Heparin Dosing Weight: 84 kg  Vital Signs: Temp: 99.1 F (37.3 C) (01/02 0409) Temp Source: Oral (01/02 0409) BP: 107/75 (01/02 0700) Pulse Rate: 99 (01/02 0603)  Labs: Recent Labs    03/19/21 1412 03/19/21 1612 03/20/21 0814  HGB 12.2*  --  11.3*  HCT 38.1*  --  35.4*  PLT PLATELET CLUMPS NOTED ON SMEAR, UNABLE TO ESTIMATE  --  102*  APTT  --  31 62*  LABPROT  --  16.0*  --   INR  --  1.3*  --   HEPARINUNFRC  --  >1.10* >1.10*  CREATININE 1.14  --  0.76  TROPONINIHS 1,150* 1,317*  --      Estimated Creatinine Clearance: 78.2 mL/min (by C-G formula based on SCr of 0.76 mg/dL).   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   unk   albuterol (VENTOLIN HFA) 108 (90 Base) MCG/ACT inhaler Inhale 2 puffs into the lungs daily as needed for wheezing or shortness of breath.   unk   Amino Acids-Protein Hydrolys (FEEDING SUPPLEMENT, PRO-STAT 64,) LIQD Take 30 mLs by mouth every morning.   03/19/2021   apixaban (ELIQUIS) 5 MG TABS tablet Take 5 mg by mouth 2 (two) times daily.   03/19/2021 at 0900-1000   Ascorbic Acid (VITAMIN C) 1000 MG tablet Take 1,000 mg by  mouth every morning.   03/19/2021   aspirin EC 81 MG tablet Take 81 mg by mouth every morning. Swallow whole.   03/19/2021   atorvastatin (LIPITOR) 40 MG tablet Take 40 mg by mouth every evening.   Past Week   brimonidine (ALPHAGAN) 0.2 % ophthalmic solution Place 1 drop into both eyes 2 (two) times daily.  5 03/19/2021   cyclobenzaprine (FLEXERIL) 10 MG tablet Take 10 mg by mouth 3 (three) times daily.   03/19/2021   docusate sodium (COLACE) 100 MG capsule Take 100 mg by mouth 2 (two) times daily.   03/19/2021   dorzolamide (TRUSOPT) 2 % ophthalmic solution Place 1 drop into both eyes 2 (two) times daily.  5 03/19/2021   finasteride (PROSCAR) 5 MG tablet Take 5 mg by mouth every morning.   03/19/2021   Infant Care Products (DERMACLOUD) OINT Apply 1 application topically See admin instructions. Apply topically to sacral area and buttocks every shift for barrier protection   03/19/2021   insulin aspart (NOVOLOG) 100 UNIT/ML injection 0-15 Units, Subcutaneous, 3 times daily with meals CBG < 70: Implement Hypoglycemia measures CBG 70 - 120: 0 units CBG 121 - 150: 2 units CBG 151 - 200: 3 units CBG 201 - 250: 5 units CBG  251 - 300: 8 units CBG 301 - 350: 11 units CBG 351 - 400: 15 units CBG > 400: call MD (Patient taking differently: Inject 0-15 Units into the skin See admin instructions. Inject 0-15 units subcutaneously three times daily with meals per sliding scale:  CBG < 70: Implement Hypoglycemia measures CBG 70 - 120: 0 units CBG 121 - 150: 2 units CBG 151 - 200: 3 units CBG 201 - 250: 5 units CBG 251 - 300: 8 units CBG 301 - 350: 11 units CBG 351 - 400: 15 units CBG > 400: call MD) 10 mL 11 03/19/2021   latanoprost (XALATAN) 0.005 % ophthalmic solution Place 1 drop into the right eye at bedtime. 2.5 mL 12 Past Week   lisinopril (ZESTRIL) 10 MG tablet Take 10 mg by mouth every morning.   03/19/2021   metFORMIN (GLUCOPHAGE) 500 MG tablet Take 500 mg by mouth 2 (two) times daily with a meal.   03/19/2021   metoprolol  tartrate (LOPRESSOR) 25 MG tablet Take 25 mg by mouth 2 (two) times daily.   03/19/2021 at 0800   Multiple Vitamin (MULTIVITAMIN WITH MINERALS) TABS tablet Take 1 tablet by mouth every morning. Centrum Silver   03/19/2021   pantoprazole (PROTONIX) 40 MG tablet Take 40 mg by mouth daily before breakfast.   03/19/2021   polyethylene glycol (MIRALAX / GLYCOLAX) 17 g packet Take 17 g by mouth daily as needed (constipation).   unk   tamsulosin (FLOMAX) 0.4 MG CAPS capsule Take 1 capsule (0.4 mg total) by mouth in the morning and at bedtime. 30 capsule  03/19/2021    Scheduled:   atorvastatin  40 mg Oral Daily   brimonidine  1 drop Both Eyes BID   dorzolamide  1 drop Both Eyes BID   insulin aspart  0-9 Units Subcutaneous TID WC   latanoprost  1 drop Right Eye QHS   metoprolol tartrate  25 mg Oral BID WC   pantoprazole  40 mg Oral QAC breakfast   sodium chloride flush  3 mL Intravenous Q12H   tamsulosin  0.4 mg Oral BID   Infusions:   sodium chloride Stopped (03/19/21 1620)   heparin 1,100 Units/hr (03/20/21 5784)   lactated ringers 75 mL/hr at 03/20/21 0608   PRN: sodium chloride, acetaminophen **OR** acetaminophen, adenosine, albuterol, bisacodyl, hydrALAZINE, morphine injection, ondansetron **OR** ondansetron (ZOFRAN) IV, polyethylene glycol  Assessment: 85 yom with a history of BPH with chronic indwelling foley; glaucoma; DM; dementia; HTN; and HLD presenting with SVT. Pt found to be in SVT in the ED and with elevated troponin. Heparin per pharmacy consult placed for chest pain/ACS.  Patient was on eliquis PTA (indication?). Last dose 1/1 ~0900. Will require aPTT monitoring. Waiting for call back from RN at [facility], hopefully to clarify Eliquis indication.  aPTT this morning is slightly Sub-therapeutic at 62 seconds.  Hgb down a little from 12.2 to 11.3; plt today is 102  Goal of Therapy:  Heparin level 0.3-0.7 units/ml aPTT 66-102 seconds Monitor platelets by anticoagulation protocol:  Yes   Plan:  Increase heparin infusion to 1250 units/hr Check aPTT in 8 hours and daily while on heparin Continue to monitor H&H and platelets F/u transition back to DOAC vs discontinuation of anticoagulation?   Thank you for allowing [place] to participate in this patients care. Signe Colt, PharmD 03/20/2021 9:34 AM  **Pharmacist phone directory can be found on [email or web address] listed under [facility]**

## 2021-03-20 NOTE — Plan of Care (Signed)
°  Problem: Education: Goal: Knowledge of disease or condition will improve Outcome: Progressing   Problem: Activity: Goal: Ability to tolerate increased activity will improve Outcome: Progressing   Problem: Health Behavior/Discharge Planning: Goal: Ability to safely manage health-related needs after discharge will improve Outcome: Progressing

## 2021-03-20 NOTE — Progress Notes (Signed)
Progress Note    Michael Jensen  WUJ:[id number] DOB: Jul 04, 1935  DOA: 03/19/2021 PCP: Soundra PilonBrake, Andrew R, FNP    Brief Narrative:     Medical records reviewed and are as summarized below:  Michael Jensen is an 86 y.o. male with medical history significant of BPH with chronic indwelling foley; glaucoma with visual impairment; DM; C-spine stenosis with inability to ambulate; dementia; HTN; and HLD presenting with SVT.  Famiyl was with him at the facility this AM.  They brought him food.  He seemed to be breathing rapidly and so his daughter went to get assistance.  The aide noted that his heart rate was low.  They called 911 and EMS reported his HR was very fast.   Found to have SVT.   Assessment/Plan:   Principal Problem:   SVT (supraventricular tachycardia) (HCC) Active Problems:   Benign prostatic hyperplasia without lower urinary tract symptoms   Essential hypertension   Glaucoma   Mixed hyperlipidemia   Cognitive impairment   Elevated lactic acid level   Ambulatory dysfunction   SVT (supraventricular tachycardia) (HCC)- (present on admission) -Patient presenting with asymptomatic SVT, incidentally diagnosed when family noticed that he appeared to be breathing funny -2 episodes, HR >200, responded to Adenosine 6 -> 12 mg and currently in sinus or with mild sinus tachycardia -Cardiology managing cardiac issues -Echo ordered by cardiology: Left ventricular ejection fraction, by estimation, is 60 to 65%. The  left ventricle has normal function. The left ventricle has no regional  wall motion abnormalities. There is mild left ventricular hypertrophy.  Left ventricular diastolic parameters  are indeterminate.  -Markedly elevated troponin, trending; this could be related to ischemia (he is a very poor candidate for intervention) or demand in the setting of periodic SVT of unknown duration  GOC:  With ambulatory dysfunction and dementia, his daughter is very concerned about quality  of life and appears to be seriously considering DNR and maybe even comfort if cause for SVT is not identified  -palliative care consult  Reported recent LE DVT -on eliquis -repeat duplexes here to confirm  Elevated lactic acid level- (present on admission) -Some concern for sepsis with SVT, hypotension -Lactate is markedly elevated -However, this may all be related to hypoperfusion in the setting of marked SVT -Will trend lactate -UA is pending -Blood cultures are NGTD -He did have diarrhea x 4 episodes yesterday but none since -For right now, will hold antibiotics    Essential hypertension- (present on admission) -Hypotensive during SVT  -seen by cards: metoprolol BID   Ambulatory dysfunction- (present on admission) -He is not ambulatory and has not walked in maybe a year -Will need to return to SNF at the time of dc   Cognitive impairment- (present on admission) -Chronic dementia -Does not appear to be taking medication for this issue -Delirium precautions ordered   Mixed hyperlipidemia- (present on admission) -Continue Lipitor   Glaucoma- (present on admission) -Legally blind -Continue drops - latanoprost, dorzolamide, brimonidine   Benign prostatic hyperplasia without lower urinary tract symptoms- (present on admission) -Previously had indwelling foley, none currently  Hypomagnesemia -replete       Family Communication/Anticipated D/C date and plan/Code Status   DVT prophylaxis: eliquis Code Status: Full Code.  Disposition Plan: Status is: Inpatient  Remains inpatient appropriate because: GOC and return to SNF    Medical Consultants:   Palliative care cards  Subjective:   Says he was on a hunger strike as he was angry at the nursing staff  Objective:    Vitals:   03/20/21 0029 03/20/21 0409 03/20/21 0603 03/20/21 0700  BP: 98/61 111/65 109/63 107/75  Pulse: 98 98 99   Resp: 17 16 17    Temp: 98.3 F (36.8 C) 99.1 F (37.3 C)    TempSrc:  Oral Oral    SpO2: 97% 98% 98%   Weight:  88 kg    Height:        Intake/Output Summary (Last 24 hours) at 03/20/2021 1421 Last data filed at 03/20/2021 1610 Gross per 24 hour  Intake 3678.56 ml  Output 300 ml  Net 3378.56 ml   Filed Weights   03/19/21 1705 03/19/21 1815 03/20/21 0409  Weight: 84 kg 84.7 kg 88 kg    Exam:  General: Appearance:     Overweight male in no acute distress     Lungs:     respirations unlabored  Heart:    Normal heart rate.   MS:   All extremities are intact.    Neurologic:   Awake, alert, legally blind      Data Reviewed:   I have personally reviewed following labs and imaging studies:  Labs: Labs show the following:   Basic Metabolic Panel: Recent Labs  Lab 03/19/21 1412 03/20/21 0814  NA 136 138  K 4.0 3.9  CL 108 106  CO2 19* 23  GLUCOSE 208* 173*  BUN 27* 23  CREATININE 1.14 0.76  CALCIUM 8.4* 8.6*  MG 1.6*  --    GFR Estimated Creatinine Clearance: 78.2 mL/min (by C-G formula based on SCr of 0.76 mg/dL). Liver Function Tests: No results for input(s): AST, ALT, ALKPHOS, BILITOT, PROT, ALBUMIN in the last 168 hours. No results for input(s): LIPASE, AMYLASE in the last 168 hours. No results for input(s): AMMONIA in the last 168 hours. Coagulation profile Recent Labs  Lab 03/19/21 1612  INR 1.3*    CBC: Recent Labs  Lab 03/19/21 1412 03/20/21 0814  WBC 11.1* 11.0*  HGB 12.2* 11.3*  HCT 38.1* 35.4*  MCV 83.9 82.1  PLT PLATELET CLUMPS NOTED ON SMEAR, UNABLE TO ESTIMATE 102*   Cardiac Enzymes: No results for input(s): CKTOTAL, CKMB, CKMBINDEX, TROPONINI in the last 168 hours. BNP (last 3 results) No results for input(s): PROBNP in the last 8760 hours. CBG: Recent Labs  Lab 03/19/21 1456 03/19/21 2128 03/20/21 0030 03/20/21 0646 03/20/21 1157  GLUCAP 194* 150* 215* 167* 162*   D-Dimer: No results for input(s): DDIMER in the last 72 hours. Hgb A1c: No results for input(s): HGBA1C in the last 72  hours. Lipid Profile: No results for input(s): CHOL, HDL, LDLCALC, TRIG, CHOLHDL, LDLDIRECT in the last 72 hours. Thyroid function studies: Recent Labs    03/19/21 1413  TSH 1.908   Anemia work up: No results for input(s): VITAMINB12, FOLATE, FERRITIN, TIBC, IRON, RETICCTPCT in the last 72 hours. Sepsis Labs: Recent Labs  Lab 03/19/21 1412 03/19/21 1612 03/19/21 1850 03/20/21 0814  WBC 11.1*  --   --  11.0*  LATICACIDVEN  --  4.1* 3.3*  --     Microbiology Recent Results (from the past 240 hour(s))  Resp Panel by RT-PCR (Flu A&B, Covid) Nasopharyngeal Swab     Status: None   Collection Time: 03/19/21  2:12 PM   Specimen: Nasopharyngeal Swab; Nasopharyngeal(NP) swabs in vial transport medium  Result Value Ref Range Status   SARS Coronavirus 2 by RT PCR NEGATIVE NEGATIVE Final    Comment: (NOTE) SARS-CoV-2 target nucleic acids are NOT DETECTED.  The  SARS-CoV-2 RNA is generally detectable in upper respiratory specimens during the acute phase of infection. The lowest concentration of SARS-CoV-2 viral copies this assay can detect is 138 copies/mL. A negative result does not preclude SARS-Cov-2 infection and should not be used as the sole basis for treatment or other patient management decisions. A negative result may occur with  improper specimen collection/handling, submission of specimen other than nasopharyngeal swab, presence of viral mutation(s) within the areas targeted by this assay, and inadequate number of viral copies(<138 copies/mL). A negative result must be combined with clinical observations, patient history, and epidemiological information. The expected result is Negative.  Fact Sheet for Patients:  BloggerCourse.comhttps://www.fda.gov/media/152166/download  Fact Sheet for Healthcare Providers:  SeriousBroker.ithttps://www.fda.gov/media/152162/download  This test is no t yet approved or cleared by the [place] FDA and  has been authorized for detection and/or diagnosis of SARS-CoV-2  by FDA under an Emergency Use Authorization (EUA). This EUA will remain  in effect (meaning this test can be used) for the duration of the COVID-19 declaration under Section 564(b)(1) of the Act, 21 U.S.C.section 360bbb-3(b)(1), unless the authorization is terminated  or revoked sooner.       Influenza A by PCR NEGATIVE NEGATIVE Final   Influenza B by PCR NEGATIVE NEGATIVE Final    Comment: (NOTE) The Xpert Xpress SARS-CoV-2/FLU/RSV plus assay is intended as an aid in the diagnosis of influenza from Nasopharyngeal swab specimens and should not be used as a sole basis for treatment. Nasal washings and aspirates are unacceptable for Xpert Xpress SARS-CoV-2/FLU/RSV testing.  Fact Sheet for Patients: BloggerCourse.comhttps://www.fda.gov/media/152166/download  Fact Sheet for Healthcare Providers: SeriousBroker.ithttps://www.fda.gov/media/152162/download  This test is not yet approved or cleared by the [place] FDA and has been authorized for detection and/or diagnosis of SARS-CoV-2 by FDA under an Emergency Use Authorization (EUA). This EUA will remain in effect (meaning this test can be used) for the duration of the COVID-19 declaration under Section 564(b)(1) of the Act, 21 U.S.C. section 360bbb-3(b)(1), unless the authorization is terminated or revoked.  Performed at [facility], [address] [place], [place] [address]   Blood culture (routine x 2)     Status: None (Preliminary result)   Collection Time: 03/19/21  4:11 PM   Specimen: BLOOD RIGHT HAND  Result Value Ref Range Status   Specimen Description BLOOD RIGHT HAND  Final   Special Requests   Final    BOTTLES DRAWN AEROBIC ONLY Blood Culture results may not be optimal due to an inadequate volume of blood received in culture bottles   Culture   Final    NO GROWTH < 24 HOURS Performed at [facility], [address] [place], [place] [address]    Report Status PENDING  Incomplete  Blood culture (routine x 2)     Status: None  (Preliminary result)   Collection Time: 03/19/21  6:45 PM   Specimen: BLOOD LEFT HAND  Result Value Ref Range Status   Specimen Description BLOOD LEFT HAND  Final   Special Requests   Final    BOTTLES DRAWN AEROBIC ONLY Blood Culture adequate volume   Culture   Final    NO GROWTH < 12 HOURS Performed at [facility], [address] [place], [place] [address]    Report Status PENDING  Incomplete    Procedures and diagnostic studies:  DG Chest Portable 1 View  Result Date: 03/19/2021 CLINICAL DATA:  Breathing problems. EXAM: PORTABLE CHEST 1 VIEW COMPARISON:  03/01/2021 FINDINGS: Cardiac silhouette  is normal in size. No mediastinal or hilar masses. Clear lungs.  No convincing pleural effusion.  No pneumothorax. Skeletal structures are grossly intact. IMPRESSION: No active disease. Electronically Signed   By: Amie Portland M.D.   On: 03/19/2021 14:51   ECHOCARDIOGRAM COMPLETE  Result Date: 03/20/2021    ECHOCARDIOGRAM REPORT   Patient Name:   Michael Jensen Date of Exam: 03/20/2021 Medical Rec #:  [id number]     Height:       70.0 in Accession #:    [id number]    Weight:       235.9 lb Date of Birth:  Aug 11, 1935     BSA:          2.239 m Patient Age:    85 years      BP:           107/75 mmHg Patient Gender: M             HR:           99 bpm. Exam Location:  Inpatient Procedure: 2D Echo, Cardiac Doppler and Color Doppler Indications:    Paroxysmal SVT (supraventricular tachycardia) (HCC)  History:        Patient has no prior history of Echocardiogram examinations.                 Risk Factors:Hypertension, Diabetes and Dyslipidemia.  Sonographer:    Celesta Gentile RCS Referring Phys: [id number] JENNIFER YATES IMPRESSIONS  1. Left ventricular ejection fraction, by estimation, is 60 to 65%. The left ventricle has normal function. The left ventricle has no regional wall motion abnormalities. There is mild left ventricular hypertrophy. Left ventricular diastolic parameters are indeterminate.  2. Right  ventricular systolic function is normal. The right ventricular size is normal.  3. The mitral valve is normal in structure. No evidence of mitral valve regurgitation. No evidence of mitral stenosis.  4. The aortic valve is tricuspid. Aortic valve regurgitation is not visualized. No aortic stenosis is present.  5. Aortic dilatation noted. There is mild dilatation of the aortic root, measuring 40 mm. FINDINGS  Left Ventricle: Left ventricular ejection fraction, by estimation, is 60 to 65%. The left ventricle has normal function. The left ventricle has no regional wall motion abnormalities. The left ventricular internal cavity size was normal in size. There is  mild left ventricular hypertrophy. Left ventricular diastolic parameters are indeterminate. Right Ventricle: Cannot estimate PASP, IVC poorly visualized. The right ventricular size is normal. No increase in right ventricular wall thickness. Right ventricular systolic function is normal. Left Atrium: Left atrial size was normal in size. Right Atrium: Right atrial size was normal in size. Pericardium: There is no evidence of pericardial effusion. Mitral Valve: The mitral valve is normal in structure. No evidence of mitral valve regurgitation. No evidence of mitral valve stenosis. Tricuspid Valve: The tricuspid valve is normal in structure. Tricuspid valve regurgitation is not demonstrated. No evidence of tricuspid stenosis. Aortic Valve: The aortic valve is tricuspid. Aortic valve regurgitation is not visualized. No aortic stenosis is present. Aortic valve mean gradient measures 4.7 mmHg. Aortic valve peak gradient measures 8.4 mmHg. Aortic valve area, by VTI measures 2.96 cm. Pulmonic Valve: The pulmonic valve was not well visualized. Pulmonic valve regurgitation is mild. No evidence of pulmonic stenosis. Aorta: Aortic dilatation noted. There is mild dilatation of the aortic root, measuring 40 mm. IAS/Shunts: The interatrial septum was not well visualized.  LEFT  VENTRICLE PLAX 2D LVIDd:  4.60 cm   Diastology LVIDs:         2.90 cm   LV e' medial:    7.96 cm/s LV PW:         1.00 cm   LV E/e' medial:  11.0 LV IVS:        1.10 cm   LV e' lateral:   8.95 cm/s LVOT diam:     2.10 cm   LV E/e' lateral: 9.8 LV SV:         79 LV SV Index:   35 LVOT Area:     3.46 cm  RIGHT VENTRICLE RV S prime:     21.60 cm/s TAPSE (M-mode): 2.2 cm LEFT ATRIUM             Index        RIGHT ATRIUM           Index LA diam:        3.70 cm 1.65 cm/m   RA Area:     12.10 cm LA Vol (A2C):   57.4 ml 25.64 ml/m  RA Volume:   23.90 ml  10.67 ml/m LA Vol (A4C):   50.6 ml 22.60 ml/m LA Biplane Vol: 54.5 ml 24.34 ml/m  AORTIC VALVE AV Area (Vmax):    2.70 cm AV Area (Vmean):   2.78 cm AV Area (VTI):     2.96 cm AV Vmax:           144.78 cm/s AV Vmean:          102.482 cm/s AV VTI:            0.267 m AV Peak Grad:      8.4 mmHg AV Mean Grad:      4.7 mmHg LVOT Vmax:         113.00 cm/s LVOT Vmean:        82.400 cm/s LVOT VTI:          0.228 m LVOT/AV VTI ratio: 0.85  AORTA Ao Root diam: 4.00 cm MITRAL VALVE               TRICUSPID VALVE MV Area (PHT): 3.42 cm    TR Peak grad:   19.5 mmHg MV Decel Time: 222 msec    TR Vmax:        221.00 cm/s MV E velocity: 87.80 cm/s MV A velocity: 93.20 cm/s  SHUNTS MV E/A ratio:  0.94        Systemic VTI:  0.23 m                            Systemic Diam: 2.10 cm Dina Rich MD Electronically signed by Dina Rich MD Signature Date/Time: 03/20/2021/1:45:48 PM    Final     Medications:    atorvastatin  40 mg Oral Daily   brimonidine  1 drop Both Eyes BID   dorzolamide  1 drop Both Eyes BID   insulin aspart  0-9 Units Subcutaneous TID WC   latanoprost  1 drop Right Eye QHS   metoprolol tartrate  25 mg Oral BID WC   pantoprazole  40 mg Oral QAC breakfast   sodium chloride flush  3 mL Intravenous Q12H   tamsulosin  0.4 mg Oral BID   Continuous Infusions:  sodium chloride Stopped (03/19/21 1620)   heparin 1,250 Units/hr (03/20/21 1040)    lactated ringers 75 mL/hr at 03/20/21 0608     LOS: 0 days  Michael Jensen  [facility]   How to contact the [facility] Attending or Consulting provider 7A - 7P or covering provider during after hours 7P -7A, for this patient?  Check the care team in [facility] and look for a) attending/consulting TR[facility] provider listed and b) the [facility] team listed Log into [email or web address] and use [place]'s universal password to access. If you do not have the password, please contact the hospital operator. Locate the [facility] provider you are looking for under [facility] and page to a number that you can be directly reached. If you still have difficulty reaching the provider, please page the [facility] (Director on Call) for the Hospitalists listed on amion for assistance.  03/20/2021, 2:21 PM

## 2021-03-20 NOTE — Progress Notes (Signed)
Bilateral lower extremity venous duplex completed. Refer to "CV Proc" under chart review to view preliminary results.  03/20/2021 5:06 PM Eula Fried., MHA, RVT, RDCS, RDMS

## 2021-03-20 NOTE — [place] FL2 (Signed)
[place] MEDICAID FL2 LEVEL OF CARE SCREENING TOOL     IDENTIFICATION  Patient Name: Michael Jensen Birthdate: [birth year] Sex: male Admission Date (Current Location): 03/19/2021  [facility] and [place] Number:  Producer, television/film/video and Address:  [facility], [address] [place], [place] [address]      Provider Number: [id number]  Attending Physician Name and Address:  Joseph Art, DO  Relative Name and Phone Number:  Lurlean Nanny (Daughter)   [phone]    Current Level of Care: Hospital Recommended Level of Care: Skilled Nursing Facility Prior Approval Number:    Date Approved/Denied:   PASRR Number:    Discharge Plan: SNF    Current Diagnoses: Patient Active Problem List   Diagnosis Date Noted   SVT (supraventricular tachycardia) (HCC) 03/19/2021   Elevated lactic acid level 03/19/2021   Ambulatory dysfunction 03/19/2021   Cognitive impairment 03/01/2021   Legal blindness, as defined in [place] 03/01/2021   Recurrent falls 03/01/2021   Chronic indwelling Foley catheter 03/01/2021   Acute urinary retention 11/25/2020   Spinal stenosis of lumbar region at multiple levels 11/25/2020   Spinal stenosis, cervicothoracic region 11/25/2020   Benign prostatic hyperplasia without lower urinary tract symptoms 03/04/2020   Diabetic peripheral neuropathy associated with type 2 diabetes mellitus (HCC) 03/04/2020   Essential hypertension 03/04/2020   Gastroesophageal reflux disease without esophagitis 03/04/2020   Glaucoma 03/04/2020   Mixed hyperlipidemia 03/04/2020   Osteoarthritis of knee 03/04/2020   Thrombocytopenia (HCC) 03/04/2020    Orientation RESPIRATION BLADDER Height & Weight     Self  Normal Continent, External catheter Weight: 194 lb 0.1 oz (88 kg) Height:  5\' 10"  (177.8 cm)  BEHAVIORAL SYMPTOMS/MOOD NEUROLOGICAL BOWEL NUTRITION STATUS        Diet (See DC Summary)  AMBULATORY STATUS COMMUNICATION OF NEEDS Skin   Limited Assist  Verbally Normal                       Personal Care Assistance Level of Assistance  Bathing, Feeding, Dressing Bathing Assistance: Limited assistance Feeding assistance: Independent Dressing Assistance: Limited assistance     Functional Limitations Info  Hearing, Sight, Speech Sight Info: Impaired Hearing Info: Adequate Speech Info: Adequate    SPECIAL CARE FACTORS FREQUENCY                       Contractures Contractures Info: Not present    Additional Factors Info  Code Status, Allergies Code Status Info: DNR Allergies Info: NKA           Current Medications (03/20/2021):  This is the current hospital active medication list Current Facility-Administered Medications  Medication Dose Route Frequency Provider Last Rate Last Admin   0.9 %  sodium chloride infusion   Intravenous Continuous PRN 05/18/2021, MD   Stopped at 03/19/21 1620   acetaminophen (TYLENOL) tablet 650 mg  650 mg Oral Q6H PRN 05/17/21, MD       Or   acetaminophen (TYLENOL) suppository 650 mg  650 mg Rectal Q6H PRN Jonah Blue, MD       adenosine (ADENOCARD) 6 MG/2ML injection   Intravenous PRN Jonah Blue, MD   12 mg at 03/19/21 1409   albuterol (PROVENTIL) (2.5 MG/3ML) 0.083% nebulizer solution 2.5 mg  2.5 mg Nebulization Q2H PRN 05/17/21, MD       apixaban Jonah Blue) tablet 10 mg  10 mg Oral BID Everlene Balls U, DO   10  mg at 03/20/21 1529   Followed by   Melene Muller ON 03/27/2021] apixaban (ELIQUIS) tablet 5 mg  5 mg Oral BID Vann, Jessica U, DO       atorvastatin (LIPITOR) tablet 40 mg  40 mg Oral Daily Jonah Blue, MD   40 mg at 03/20/21 0856   bisacodyl (DULCOLAX) EC tablet 5 mg  5 mg Oral Daily PRN Jonah Blue, MD       brimonidine (ALPHAGAN) 0.2 % ophthalmic solution 1 drop  1 drop Both Eyes BID Jonah Blue, MD   1 drop at 03/20/21 0900   cyclobenzaprine (FLEXERIL) tablet 10 mg  10 mg Oral TID Marlin Canary U, DO   10 mg at 03/20/21 1530   docusate sodium (COLACE)  capsule 100 mg  100 mg Oral BID Vann, Jessica U, DO   100 mg at 03/20/21 1530   dorzolamide (TRUSOPT) 2 % ophthalmic solution 1 drop  1 drop Both Eyes BID Jonah Blue, MD   1 drop at 03/20/21 0900   [START ON 03/21/2021] finasteride (PROSCAR) tablet 5 mg  5 mg Oral q morning Vann, Jessica U, DO       hydrALAZINE (APRESOLINE) injection 5 mg  5 mg Intravenous Q4H PRN Jonah Blue, MD       insulin aspart (novoLOG) injection 0-9 Units  0-9 Units Subcutaneous TID WC Jonah Blue, MD   2 Units at 03/20/21 1717   lactated ringers infusion   Intravenous Continuous Jonah Blue, MD 75 mL/hr at 03/20/21 0608 New Bag at 03/20/21 8250   latanoprost (XALATAN) 0.005 % ophthalmic solution 1 drop  1 drop Right Eye Noemi Chapel, MD   1 drop at 03/19/21 2238   metoprolol tartrate (LOPRESSOR) tablet 25 mg  25 mg Oral BID WC Jonah Blue, MD   25 mg at 03/20/21 1734   morphine 2 MG/ML injection 2 mg  2 mg Intravenous Q2H PRN Jonah Blue, MD   2 mg at 03/20/21 0856   [START ON 03/21/2021] multivitamin with minerals tablet 1 tablet  1 tablet Oral q morning Vann, Jessica U, DO       ondansetron [facility]) tablet 4 mg  4 mg Oral Q6H PRN Jonah Blue, MD       Or   ondansetron [facility]) injection 4 mg  4 mg Intravenous Q6H PRN Jonah Blue, MD       pantoprazole (PROTONIX) EC tablet 40 mg  40 mg Oral QAC breakfast Jonah Blue, MD   40 mg at 03/20/21 0743   polyethylene glycol (MIRALAX / GLYCOLAX) packet 17 g  17 g Oral Daily PRN Jonah Blue, MD       sodium chloride flush (NS) 0.9 % injection 3 mL  3 mL Intravenous Q12H Jonah Blue, MD   3 mL at 03/20/21 0900   tamsulosin (FLOMAX) capsule 0.4 mg  0.4 mg Oral BID Jonah Blue, MD   0.4 mg at 03/20/21 0370     Discharge Medications: Please see discharge summary for a list of discharge medications.  Relevant Imaging Results:  Relevant Lab Results:   Additional Information SSN: [id number]. Pfizer COVID-19 Vaccine 06/27/2019  , 05/19/2019  Ivette Loyal, LCSWA

## 2021-03-20 NOTE — Progress Notes (Signed)
Progress Note  Patient Name: Michael Jensen Date of Encounter: 03/20/2021  [facility] Cardiologist: Johnsie Cancel  Subjective   86 year old gentleman with a history of dementia.  He is bedridden and blind.  Patient was admitted with an episode of SVT.  He is had very poor oral intake for the past 48 hours actually more like a wek or so .    Has been found to have a UTI-Enterobacter (Contaminant, has chronic indwelling foley catheter).  Has underlying history of hypertension, diabetes mellitus, hyperlipidemia. he  The SVT resolved with adenosine.  He is currently in sinus rhythm. Troponin is 1317-likely due to demand ischemia.  His lactic acid level was 4.1 yesterday afternoon.  Is currently on metoprolol 25 mg twice a day  Inpatient Medications    Scheduled Meds:  atorvastatin  40 mg Oral Daily   brimonidine  1 drop Both Eyes BID   dorzolamide  1 drop Both Eyes BID   insulin aspart  0-9 Units Subcutaneous TID WC   latanoprost  1 drop Right Eye QHS   metoprolol tartrate  25 mg Oral BID WC   pantoprazole  40 mg Oral QAC breakfast   sodium chloride flush  3 mL Intravenous Q12H   tamsulosin  0.4 mg Oral BID   Continuous Infusions:  sodium chloride Stopped (03/19/21 1620)   heparin 1,100 Units/hr (03/20/21 [id number])   lactated ringers 75 mL/hr at 03/20/21 0608   PRN Meds: sodium chloride, acetaminophen **OR** acetaminophen, adenosine, albuterol, bisacodyl, hydrALAZINE, morphine injection, ondansetron **OR** ondansetron (ZOFRAN) IV, polyethylene glycol   Vital Signs    Vitals:   03/20/21 0029 03/20/21 0409 03/20/21 0603 03/20/21 0700  BP: 98/61 111/65 109/63 107/75  Pulse: 98 98 99   Resp: 17 16 17    Temp: 98.3 F (36.8 C) 99.1 F (37.3 C)    TempSrc: Oral Oral    SpO2: 97% 98% 98%   Weight:  88 kg    Height:        Intake/Output Summary (Last 24 hours) at 03/20/2021 0751 Last data filed at 03/20/2021 [id number] Gross per 24 hour  Intake 3678.56 ml  Output 300 ml  Net 3378.56 ml    Last 3 Weights 03/20/2021 03/19/2021 03/19/2021  Weight (lbs) 194 lb 0.1 oz 186 lb 11.7 oz 185 lb 3 oz  Weight (kg) 88 kg 84.7 kg 84 kg      Telemetry    NSR , occasional PVC  - Personally Reviewed  ECG     - Personally Reviewed  Physical Exam   GEN:  elderly male,   bed bound,   Neck: No JVD Cardiac: RRR,   Respiratory: Clear to auscultation bilaterally. GI: Soft, nontender, non-distended  MS: No edema; No deformity. Neuro:  Nonfocal  Psych: Normal affect   Labs    High Sensitivity Troponin:   Recent Labs  Lab 03/19/21 1412 03/19/21 1612  TROPONINIHS 1,150* 1,317*     Chemistry Recent Labs  Lab 03/19/21 1412  NA 136  K 4.0  CL 108  CO2 19*  GLUCOSE 208*  BUN 27*  CREATININE 1.14  CALCIUM 8.4*  MG 1.6*  GFRNONAA >60  ANIONGAP 9    Lipids No results for input(s): CHOL, TRIG, HDL, LABVLDL, LDLCALC, CHOLHDL in the last 168 hours.  Hematology Recent Labs  Lab 03/19/21 1412  WBC 11.1*  RBC 4.54  HGB 12.2*  HCT 38.1*  MCV 83.9  MCH 26.9  MCHC 32.0  RDW 16.0*  PLT PLATELET CLUMPS NOTED ON SMEAR,  UNABLE TO ESTIMATE   Thyroid  Recent Labs  Lab 03/19/21 1413  TSH 1.908    BNPNo results for input(s): BNP, PROBNP in the last 168 hours.  DDimer No results for input(s): DDIMER in the last 168 hours.   Radiology    DG Chest Portable 1 View  Result Date: 03/19/2021 CLINICAL DATA:  Breathing problems. EXAM: PORTABLE CHEST 1 VIEW COMPARISON:  03/01/2021 FINDINGS: Cardiac silhouette is normal in size. No mediastinal or hilar masses. Clear lungs.  No convincing pleural effusion.  No pneumothorax. Skeletal structures are grossly intact. IMPRESSION: No active disease. Electronically Signed   By: Lajean Manes M.D.   On: 03/19/2021 14:51    Cardiac Studies      Patient Profile     86 y.o. male   with dementia , admitted with SVT   Assessment & Plan     SVT :   better on metoprolol.   He had not been eating well for several weeks.   Also may have a  TUI. Cont current meds for SVT   2.  Poor PO intake:  he says he was doing a "1 man strike" against the nursing home because of the poor care he was receiving . Ive encouraged him to eat better .      For questions or updates, please contact [place] Please consult [email or web address] for contact info under        Signed, Mertie Moores, MD  03/20/2021, 7:51 AM

## 2021-03-20 NOTE — Progress Notes (Signed)
ANTICOAGULATION CONSULT NOTE - Initial Consult  Pharmacy Consult for transition heparin to apixaban Indication: DVT  No Known Allergies  Patient Measurements: Height: 5\' 10"  (177.8 cm) Weight: 88 kg (194 lb 0.1 oz) IBW/kg (Calculated) : 73  Vital Signs: Temp: 99.1 F (37.3 C) (01/02 0409) Temp Source: Oral (01/02 0409) BP: 107/75 (01/02 0700) Pulse Rate: 99 (01/02 0603)  Labs: Recent Labs    03/19/21 1412 03/19/21 1612 03/20/21 0814  HGB 12.2*  --  11.3*  HCT 38.1*  --  35.4*  PLT PLATELET CLUMPS NOTED ON SMEAR, UNABLE TO ESTIMATE  --  102*  APTT  --  31 62*  LABPROT  --  16.0*  --   INR  --  1.3*  --   HEPARINUNFRC  --  >1.10* >1.10*  CREATININE 1.14  --  0.76  TROPONINIHS 1,150* 1,317*  --     Estimated Creatinine Clearance: 78.2 mL/min (by C-G formula based on SCr of 0.76 mg/dL).   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   unk   albuterol (VENTOLIN HFA) 108 (90 Base) MCG/ACT inhaler Inhale 2 puffs into the lungs daily as needed for wheezing or shortness of breath.   unk   Amino Acids-Protein Hydrolys (FEEDING SUPPLEMENT, PRO-STAT 64,) LIQD Take 30 mLs by mouth every morning.   03/19/2021   apixaban (ELIQUIS) 5 MG TABS tablet Take 5 mg by mouth 2 (two) times daily.   03/19/2021 at 0900-1000   Ascorbic Acid (VITAMIN C) 1000 MG tablet Take 1,000 mg by mouth every morning.   03/19/2021   aspirin EC 81 MG tablet Take 81 mg by mouth every morning. Swallow whole.   03/19/2021   atorvastatin (LIPITOR) 40 MG tablet Take 40 mg by mouth  every evening.   Past Week   brimonidine (ALPHAGAN) 0.2 % ophthalmic solution Place 1 drop into both eyes 2 (two) times daily.  5 03/19/2021   cyclobenzaprine (FLEXERIL) 10 MG tablet Take 10 mg by mouth 3 (three) times daily.   03/19/2021   docusate sodium (COLACE) 100 MG capsule Take 100 mg by mouth 2 (two) times daily.   03/19/2021   dorzolamide (TRUSOPT) 2 % ophthalmic solution Place 1 drop into both eyes 2 (two) times daily.  5 03/19/2021   finasteride (PROSCAR) 5 MG tablet Take 5 mg by mouth every morning.   03/19/2021   Infant Care Products (DERMACLOUD) OINT Apply 1 application topically See admin instructions. Apply topically to sacral area and buttocks every shift for barrier protection   03/19/2021   insulin aspart (NOVOLOG) 100 UNIT/ML injection 0-15 Units, Subcutaneous, 3 times daily with meals CBG < 70: Implement Hypoglycemia measures CBG 70 - 120: 0 units CBG 121 - 150: 2 units CBG 151 - 200: 3 units CBG 201 - 250: 5 units CBG 251 - 300: 8 units CBG 301 - 350: 11 units CBG 351 - 400: 15 units CBG > 400: call MD (Patient taking differently: Inject 0-15 Units into the skin See admin  instructions. Inject 0-15 units subcutaneously three times daily with meals per sliding scale:  CBG < 70: Implement Hypoglycemia measures CBG 70 - 120: 0 units CBG 121 - 150: 2 units CBG 151 - 200: 3 units CBG 201 - 250: 5 units CBG 251 - 300: 8 units CBG 301 - 350: 11 units CBG 351 - 400: 15 units CBG > 400: call MD) 10 mL 11 03/19/2021   latanoprost (XALATAN) 0.005 % ophthalmic solution Place 1 drop into the right eye at bedtime. 2.5 mL 12 Past Week   lisinopril (ZESTRIL) 10 MG tablet Take 10 mg by mouth every morning.   03/19/2021   metFORMIN (GLUCOPHAGE) 500 MG tablet Take 500 mg by mouth 2 (two) times daily with a meal.   03/19/2021   metoprolol tartrate (LOPRESSOR) 25 MG tablet Take 25 mg by mouth 2 (two) times daily.   03/19/2021 at 0800   Multiple Vitamin (MULTIVITAMIN WITH MINERALS) TABS tablet Take 1 tablet by mouth every  morning. Centrum Silver   03/19/2021   pantoprazole (PROTONIX) 40 MG tablet Take 40 mg by mouth daily before breakfast.   03/19/2021   polyethylene glycol (MIRALAX / GLYCOLAX) 17 g packet Take 17 g by mouth daily as needed (constipation).   unk   tamsulosin (FLOMAX) 0.4 MG CAPS capsule Take 1 capsule (0.4 mg total) by mouth in the morning and at bedtime. 30 capsule  03/19/2021    Assessment: 73 yoM who was brought in via EMS.  Pt found to be in SVT in the ED and with elevated troponin. Patient initially started on heparin for chest pain/ACS. Pharmacy now consulted to transition to apixaban. Patient was on apixaban PTA for DVTs (reported by RN from [place]). Dopplers ordered to confirm. Appears patient was only started on maintenance dose apixaban when DVTs initially found. Hgb down a little from 12.2 to 11.3; plt today is 102   Goal of Therapy:  Therapeutic anticoagulation Monitor platelets by anticoagulation protocol: Yes   Plan:  Stop heparin. Start Apixaban 10 mg PO BID x 7 days, followed by  Apixaban 5 mg PO BID Monitor for signs and symptoms of bleeding   Thank you for allowing [place] to participate in this patients care. Jens Som, PharmD 03/20/2021 2:51 PM  **Pharmacist phone directory can be found on [email or web address] listed under [place]**

## 2021-03-21 LAB — MAGNESIUM: Magnesium: 1.8 mg/dL (ref 1.7–2.4)

## 2021-03-21 LAB — URINALYSIS, ROUTINE W REFLEX MICROSCOPIC
Bilirubin Urine: NEGATIVE
Glucose, UA: NEGATIVE mg/dL
Hgb urine dipstick: NEGATIVE
Ketones, ur: NEGATIVE mg/dL
Leukocytes,Ua: NEGATIVE
Nitrite: NEGATIVE
Protein, ur: NEGATIVE mg/dL
Specific Gravity, Urine: 1.027 (ref 1.005–1.030)
pH: 5 (ref 5.0–8.0)

## 2021-03-21 LAB — CBC
HCT: 31.4 % — ABNORMAL LOW (ref 39.0–52.0)
Hemoglobin: 10.5 g/dL — ABNORMAL LOW (ref 13.0–17.0)
MCH: 27 pg (ref 26.0–34.0)
MCHC: 33.4 g/dL (ref 30.0–36.0)
MCV: 80.7 fL (ref 80.0–100.0)
Platelets: 96 10*3/uL — ABNORMAL LOW (ref 150–400)
RBC: 3.89 MIL/uL — ABNORMAL LOW (ref 4.22–5.81)
RDW: 15.9 % — ABNORMAL HIGH (ref 11.5–15.5)
WBC: 7.8 10*3/uL (ref 4.0–10.5)
nRBC: 0 % (ref 0.0–0.2)

## 2021-03-21 LAB — BASIC METABOLIC PANEL
Anion gap: 7 (ref 5–15)
BUN: 14 mg/dL (ref 8–23)
CO2: 22 mmol/L (ref 22–32)
Calcium: 7.7 mg/dL — ABNORMAL LOW (ref 8.9–10.3)
Chloride: 105 mmol/L (ref 98–111)
Creatinine, Ser: 0.71 mg/dL (ref 0.61–1.24)
GFR, Estimated: >60 mL/min (ref >60)
Glucose, Bld: 157 mg/dL — ABNORMAL HIGH (ref 70–99)
Potassium: 3.4 mmol/L — ABNORMAL LOW (ref 3.5–5.1)
Sodium: 134 mmol/L — ABNORMAL LOW (ref 135–145)

## 2021-03-21 LAB — GLUCOSE, CAPILLARY
Glucose-Capillary: 157 mg/dL — ABNORMAL HIGH (ref 70–99)
Glucose-Capillary: 181 mg/dL — ABNORMAL HIGH (ref 70–99)

## 2021-03-21 LAB — RESP PANEL BY RT-PCR (FLU A&B, COVID) ARPGX2
Influenza A by PCR: NEGATIVE
Influenza B by PCR: NEGATIVE
SARS Coronavirus 2 by RT PCR: NEGATIVE

## 2021-03-21 LAB — LACTIC ACID, PLASMA: Lactic Acid, Venous: 1.4 mmol/L (ref 0.5–1.9)

## 2021-03-21 MED ORDER — INSULIN ASPART 100 UNIT/ML IJ SOLN
0.0000 [IU] | Freq: Three times a day (TID) | INTRAMUSCULAR | Status: DC
  Administered 2021-03-21 (×2): 2 [IU] via SUBCUTANEOUS

## 2021-03-21 MED ORDER — APIXABAN (ELIQUIS) VTE STARTER PACK (10MG AND 5MG): ORAL_TABLET | ORAL | 0 refills | Status: AC

## 2021-03-21 MED ORDER — POTASSIUM CHLORIDE CRYS ER 20 MEQ PO TBCR
40.0000 meq | EXTENDED_RELEASE_TABLET | Freq: Once | ORAL | Status: AC
  Administered 2021-03-21: 40 meq via ORAL
  Filled 2021-03-21: qty 2

## 2021-03-21 MED ORDER — MAGNESIUM SULFATE 2 GM/50ML IV SOLN
2.0000 g | Freq: Once | INTRAVENOUS | Status: AC
  Administered 2021-03-21: 2 g via INTRAVENOUS
  Filled 2021-03-21: qty 50

## 2021-03-21 NOTE — TOC Transition Note (Signed)
Transition of Care [facility]) - CM/SW Discharge Note   Patient Details  Name: Michael Jensen MRN: [id number] Date of Birth: 26-Dec-1935  [facility] CM/SW Contact:  Lynett Grimes Phone Number: 03/21/2021, 1:29 PM   Clinical Narrative:    [name] Anticipated DC date: 03/21/2021 Family notified: Pt daughter Transport by: Sharin Mons   Per MD patient ready for DC [facility] room 212. RN to call report prior to discharge [phone]). RN, patient, patient's family, and facility notified of DC. Discharge Summary and FL2 sent to facility. DC packet on chart. Ambulance transport requested for patient.   CSW will sign off for now as social work intervention is no longer needed. Please consult [place] again if new needs arise.           Patient Goals and CMS Choice        Discharge Placement                       Discharge Plan and Services                                     Social Determinants of Health (SDOH) Interventions     Readmission Risk Interventions No flowsheet data found.

## 2021-03-21 NOTE — Discharge Summary (Addendum)
Physician Discharge Summary  Michael Jensen [id number] DOB: [birth year] DOA: 03/19/2021  PCP: Kristen Loader, FNP  Admit date: 03/19/2021 Discharge date: 03/21/2021  Admitted From: SNF Discharge disposition: SNF WITH HOSPICE   Recommendations for Outpatient Follow-Up:   Transitioned to hospice Eliquis starter pack   Discharge Diagnosis:   Principal Problem:   SVT (supraventricular tachycardia) ([place]) Active Problems:   Benign prostatic hyperplasia without lower urinary tract symptoms   Essential hypertension   Glaucoma   Mixed hyperlipidemia   Cognitive impairment   Elevated lactic acid level   Ambulatory dysfunction    Discharge Condition: stable  Diet recommendation:   Regular.  Wound care: None.  Code status: DNR   History of Present Illness:   Michael Jensen is a 86 y.o. male with medical history significant of BPH with chronic indwelling foley; glaucoma with visual impairment; DM; C-spine stenosis with inability to ambulate; dementia; HTN; and HLD presenting with SVT.  Famiyl was with him at the facility this AM.  They brought him food.  He seemed to be breathing rapidly and so his daughter went to get assistance.  The aide noted that his heart rate was low.  They called 911 and EMS reported his HR was very fast.  He has not actually felt bad.  The facility has reported that he hasn't been eating well and he had loose stools 4 times yesterday.  No n/v.  No diarrhea today.  No recent antibiotics.   Hospital Course by Problem:   SVT (supraventricular tachycardia) (HCC)- (present on admission) -Patient presenting with asymptomatic SVT, incidentally diagnosed when family noticed that he appeared to be breathing funny -2 episodes, HR >200, responded to Adenosine 6 -> 12 mg and currently in sinus or with mild sinus tachycardia -Cardiology managing cardiac issues- on BB -Echo ordered by cardiology: Left ventricular ejection fraction, by estimation, is 60 to 65%.  The  left ventricle has normal function. The left ventricle has no regional  wall motion abnormalities. There is mild left ventricular hypertrophy.  Left ventricular diastolic parameters  are indeterminate.    GOC:   -palliative care consult: would like him to return to SNF as soon as possible and with Hospice support   Reported recent LE DVT -on eliquis but appears to not have gotten the starter pack per pharmacy-- duplex here negative so ? If PE was the provoking factor for arrhythmia.  Now that he is hospice care will not pursue CTA to confirm, will treat with started pack   Elevated lactic acid level- (present on admission) -suspect hypoperfusion from SVT -do not think he was SEPTIC   Essential hypertension- (present on admission) -Hypotensive during SVT  -seen by cards: metoprolol BID   Ambulatory dysfunction- (present on admission) -He is not ambulatory and has not walked in maybe a year -transitioned to hospice   Cognitive impairment- (present on admission) -Chronic dementia -Does not appear to be taking medication for this issue   Mixed hyperlipidemia- (present on admission) -Continue Lipitor   Glaucoma- (present on admission) -Legally blind -Continue drops - latanoprost, dorzolamide, brimonidine   Benign prostatic hyperplasia without lower urinary tract symptoms- (present on admission) -Previously had indwelling foley, none currently   Hypomagnesemia -replete      Medical Consultants:   Cards Palliative care   Discharge Exam:   Vitals:   03/21/21 0737 03/21/21 0739  BP: 109/63 109/63  Pulse: 100 100  Resp: 15 15  Temp: 98.2 F (36.8 C) 98.2 F (36.8  C)  SpO2: 99%    Vitals:   03/21/21 0053 03/21/21 0517 03/21/21 0737 03/21/21 0739  BP: 96/68 (!) 105/49 109/63 109/63  Pulse: 99 91 100 100  Resp: 20 17 15 15   Temp: 97.9 F (36.6 C) 97.9 F (36.6 C) 98.2 F (36.8 C) 98.2 F (36.8 C)  TempSrc: Oral Oral Oral   SpO2: 97% 96% 99%   Weight:  89 kg     Height:        General exam: Appears calm and comfortable.   The results of significant diagnostics from this hospitalization (including imaging, microbiology, ancillary and laboratory) are listed below for reference.     Procedures and Diagnostic Studies:   DG Chest Portable 1 View  Result Date: 03/19/2021 CLINICAL DATA:  Breathing problems. EXAM: PORTABLE CHEST 1 VIEW COMPARISON:  03/01/2021 FINDINGS: Cardiac silhouette is normal in size. No mediastinal or hilar masses. Clear lungs.  No convincing pleural effusion.  No pneumothorax. Skeletal structures are grossly intact. IMPRESSION: No active disease. Electronically Signed   By: Lajean Manes M.D.   On: 03/19/2021 14:51   ECHOCARDIOGRAM COMPLETE  Result Date: 03/20/2021    ECHOCARDIOGRAM REPORT   Patient Name:   Michael Jensen Date of Exam: 03/20/2021 Medical Rec #:  [id number]     Height:       70.0 in Accession #:    [id number]    Weight:       235.9 lb Date of Birth:  [birth year]     BSA:          2.239 m Patient Age:    86 years      BP:           107/75 mmHg Patient Gender: M             HR:           99 bpm. Exam Location:  Inpatient Procedure: 2D Echo, Cardiac Doppler and Color Doppler Indications:    Paroxysmal SVT (supraventricular tachycardia) (HCC)  History:        Patient has no prior history of Echocardiogram examinations.                 Risk Factors:Hypertension, Diabetes and Dyslipidemia.  Sonographer:    Alvino Chapel RCS Referring Phys: [place]  1. Left ventricular ejection fraction, by estimation, is 60 to 65%. The left ventricle has normal function. The left ventricle has no regional wall motion abnormalities. There is mild left ventricular hypertrophy. Left ventricular diastolic parameters are indeterminate.  2. Right ventricular systolic function is normal. The right ventricular size is normal.  3. The mitral valve is normal in structure. No evidence of mitral valve regurgitation. No evidence of  mitral stenosis.  4. The aortic valve is tricuspid. Aortic valve regurgitation is not visualized. No aortic stenosis is present.  5. Aortic dilatation noted. There is mild dilatation of the aortic root, measuring 40 mm. FINDINGS  Left Ventricle: Left ventricular ejection fraction, by estimation, is 60 to 65%. The left ventricle has normal function. The left ventricle has no regional wall motion abnormalities. The left ventricular internal cavity size was normal in size. There is  mild left ventricular hypertrophy. Left ventricular diastolic parameters are indeterminate. Right Ventricle: Cannot estimate PASP, IVC poorly visualized. The right ventricular size is normal. No increase in right ventricular wall thickness. Right ventricular systolic function is normal. Left Atrium: Left atrial size was normal in size. Right Atrium: Right atrial size was normal in  size. Pericardium: There is no evidence of pericardial effusion. Mitral Valve: The mitral valve is normal in structure. No evidence of mitral valve regurgitation. No evidence of mitral valve stenosis. Tricuspid Valve: The tricuspid valve is normal in structure. Tricuspid valve regurgitation is not demonstrated. No evidence of tricuspid stenosis. Aortic Valve: The aortic valve is tricuspid. Aortic valve regurgitation is not visualized. No aortic stenosis is present. Aortic valve mean gradient measures 4.7 mmHg. Aortic valve peak gradient measures 8.4 mmHg. Aortic valve area, by VTI measures 2.96 cm. Pulmonic Valve: The pulmonic valve was not well visualized. Pulmonic valve regurgitation is mild. No evidence of pulmonic stenosis. Aorta: Aortic dilatation noted. There is mild dilatation of the aortic root, measuring 40 mm. IAS/Shunts: The interatrial septum was not well visualized.  LEFT VENTRICLE PLAX 2D LVIDd:         4.60 cm   Diastology LVIDs:         2.90 cm   LV e' medial:    7.96 cm/s LV PW:         1.00 cm   LV E/e' medial:  11.0 LV IVS:        1.10 cm   LV  e' lateral:   8.95 cm/s LVOT diam:     2.10 cm   LV E/e' lateral: 9.8 LV SV:         79 LV SV Index:   35 LVOT Area:     3.46 cm  RIGHT VENTRICLE RV S prime:     21.60 cm/s TAPSE (M-mode): 2.2 cm LEFT ATRIUM             Index        RIGHT ATRIUM           Index LA diam:        3.70 cm 1.65 cm/m   RA Area:     12.10 cm LA Vol (A2C):   57.4 ml 25.64 ml/m  RA Volume:   23.90 ml  10.67 ml/m LA Vol (A4C):   50.6 ml 22.60 ml/m LA Biplane Vol: 54.5 ml 24.34 ml/m  AORTIC VALVE AV Area (Vmax):    2.70 cm AV Area (Vmean):   2.78 cm AV Area (VTI):     2.96 cm AV Vmax:           144.78 cm/s AV Vmean:          102.482 cm/s AV VTI:            0.267 m AV Peak Grad:      8.4 mmHg AV Mean Grad:      4.7 mmHg LVOT Vmax:         113.00 cm/s LVOT Vmean:        82.400 cm/s LVOT VTI:          0.228 m LVOT/AV VTI ratio: 0.85  AORTA Ao Root diam: 4.00 cm MITRAL VALVE               TRICUSPID VALVE MV Area (PHT): 3.42 cm    TR Peak grad:   19.5 mmHg MV Decel Time: 222 msec    TR Vmax:        221.00 cm/s MV E velocity: 87.80 cm/s MV A velocity: 93.20 cm/s  SHUNTS MV E/A ratio:  0.94        Systemic VTI:  0.23 m  Systemic Diam: 2.10 cm Carlyle Dolly MD Electronically signed by Carlyle Dolly MD Signature Date/Time: 03/20/2021/1:45:48 PM    Final    VAS [place] LOWER EXTREMITY VENOUS (DVT)  Result Date: 03/20/2021  Lower Venous DVT Study Patient Name:  Michael Jensen  Date of Exam:   03/20/2021 Medical Rec #: [id number]      Accession #:    [id number] Date of Birth: 10/18/1935      Patient Gender: M Patient Age:   17 years Exam Location:  [facility] Procedure:      VAS [place] LOWER EXTREMITY VENOUS (DVT) Referring Phys: Eulogio Bear --------------------------------------------------------------------------------  Indications: Edema.  Comparison Study: 11/27/2020- negative lower extremity venous duplex Performing Technologist: Maudry Mayhew MHA, RDMS, RVT, RDCS  Examination Guidelines: A complete  evaluation includes B-mode imaging, spectral Doppler, color Doppler, and power Doppler as needed of all accessible portions of each vessel. Bilateral testing is considered an integral part of a complete examination. Limited examinations for reoccurring indications may be performed as noted. The reflux portion of the exam is performed with the patient in reverse Trendelenburg.  +---------+---------------+---------+-----------+----------+--------------+  RIGHT     Compressibility Phasicity Spontaneity Properties Thrombus Aging  +---------+---------------+---------+-----------+----------+--------------+  CFV       Full            Yes       Yes                                    +---------+---------------+---------+-----------+----------+--------------+  SFJ       Full                                                             +---------+---------------+---------+-----------+----------+--------------+  FV Prox   Full                                                             +---------+---------------+---------+-----------+----------+--------------+  FV Mid    Full                                                             +---------+---------------+---------+-----------+----------+--------------+  FV Distal Full                                                             +---------+---------------+---------+-----------+----------+--------------+  PFV       Full                                                             +---------+---------------+---------+-----------+----------+--------------+  POP       Full            Yes       Yes                                    +---------+---------------+---------+-----------+----------+--------------+  PTV       Full                                                             +---------+---------------+---------+-----------+----------+--------------+  PERO      Full                                                              +---------+---------------+---------+-----------+----------+--------------+   Right Technical Findings: Not visualized segments include Limited evaluation PTV and peroneal veins.  +---------+---------------+---------+-----------+----------+--------------+  LEFT      Compressibility Phasicity Spontaneity Properties Thrombus Aging  +---------+---------------+---------+-----------+----------+--------------+  CFV       Full            Yes       Yes                                    +---------+---------------+---------+-----------+----------+--------------+  SFJ       Full                                                             +---------+---------------+---------+-----------+----------+--------------+  FV Prox   Full                                                             +---------+---------------+---------+-----------+----------+--------------+  FV Mid    Full                                                             +---------+---------------+---------+-----------+----------+--------------+  FV Distal Full                                                             +---------+---------------+---------+-----------+----------+--------------+  PFV       Full                                                             +---------+---------------+---------+-----------+----------+--------------+  POP       Full            Yes       Yes                                    +---------+---------------+---------+-----------+----------+--------------+  PTV       Full                                                             +---------+---------------+---------+-----------+----------+--------------+  PERO      Full                                                             +---------+---------------+---------+-----------+----------+--------------+ Limited evaluation PTV and peroneal veins    Summary: RIGHT: - There is no evidence of deep vein thrombosis in the lower extremity. However, portions of this examination  were limited- see technologist comments above.  - No cystic structure found in the popliteal fossa.  LEFT: - There is no evidence of deep vein thrombosis in the lower extremity. However, portions of this examination were limited- see technologist comments above.  - No cystic structure found in the popliteal fossa.  *See table(s) above for measurements and observations.    Preliminary      Labs:   Basic Metabolic Panel: Recent Labs  Lab 03/19/21 1412 03/20/21 0814 03/21/21 0431  NA 136 138 134*  K 4.0 3.9 3.4*  CL 108 106 105  CO2 19* 23 22  GLUCOSE 208* 173* 157*  BUN 27* 23 14  CREATININE 1.14 0.76 0.71  CALCIUM 8.4* 8.6* 7.7*  MG 1.6*  --  1.8   GFR Estimated Creatinine Clearance: 78.6 mL/min (by C-G formula based on SCr of 0.71 mg/dL). Liver Function Tests: No results for input(s): AST, ALT, ALKPHOS, BILITOT, PROT, ALBUMIN in the last 168 hours. No results for input(s): LIPASE, AMYLASE in the last 168 hours. No results for input(s): AMMONIA in the last 168 hours. Coagulation profile Recent Labs  Lab 03/19/21 1612  INR 1.3*    CBC: Recent Labs  Lab 03/19/21 1412 03/20/21 0814 03/21/21 0431  WBC 11.1* 11.0* 7.8  HGB 12.2* 11.3* 10.5*  HCT 38.1* 35.4* 31.4*  MCV 83.9 82.1 80.7  PLT PLATELET CLUMPS NOTED ON SMEAR, UNABLE TO ESTIMATE 102* 96*   Cardiac Enzymes: No results for input(s): CKTOTAL, CKMB, CKMBINDEX, TROPONINI in the last 168 hours. BNP: Invalid input(s): POCBNP CBG: Recent Labs  Lab 03/20/21 0646 03/20/21 1157 03/20/21 1705 03/20/21 2112 03/21/21 0648  GLUCAP 167* 162* 165* 248* 157*   D-Dimer No results for input(s): DDIMER in the last 72 hours. Hgb A1c No results for input(s): HGBA1C in the last 72 hours. Lipid Profile No results for input(s): CHOL, HDL, LDLCALC, TRIG, CHOLHDL, LDLDIRECT in the last 72 hours. Thyroid function studies Recent Labs    03/19/21 1413  TSH 1.908   Anemia work up No results for input(s): VITAMINB12,  FOLATE, FERRITIN, TIBC, IRON, RETICCTPCT in the last 72 hours. Microbiology Recent Results (from  the past 240 hour(s))  Resp Panel by RT-PCR (Flu A&B, Covid) Nasopharyngeal Swab     Status: None   Collection Time: 03/19/21  2:12 PM   Specimen: Nasopharyngeal Swab; Nasopharyngeal(NP) swabs in vial transport medium  Result Value Ref Range Status   SARS Coronavirus 2 by RT PCR NEGATIVE NEGATIVE Final    Comment: (NOTE) SARS-CoV-2 target nucleic acids are NOT DETECTED.  The SARS-CoV-2 RNA is generally detectable in upper respiratory specimens during the acute phase of infection. The lowest concentration of SARS-CoV-2 viral copies this assay can detect is 138 copies/mL. A negative result does not preclude SARS-Cov-2 infection and should not be used as the sole basis for treatment or other patient management decisions. A negative result may occur with  improper specimen collection/handling, submission of specimen other than nasopharyngeal swab, presence of viral mutation(s) within the areas targeted by this assay, and inadequate number of viral copies(<138 copies/mL). A negative result must be combined with clinical observations, patient history, and epidemiological information. The expected result is Negative.  Fact Sheet for Patients:  EntrepreneurPulse.com.au  Fact Sheet for Healthcare Providers:  IncredibleEmployment.be  This test is no t yet approved or cleared by the [place] FDA and  has been authorized for detection and/or diagnosis of SARS-CoV-2 by FDA under an Emergency Use Authorization (EUA). This EUA will remain  in effect (meaning this test can be used) for the duration of the COVID-19 declaration under Section 564(b)(1) of the Act, 21 U.S.C.section 360bbb-3(b)(1), unless the authorization is terminated  or revoked sooner.       Influenza A by PCR NEGATIVE NEGATIVE Final   Influenza B by PCR NEGATIVE NEGATIVE Final    Comment:  (NOTE) The Xpert Xpress SARS-CoV-2/FLU/RSV plus assay is intended as an aid in the diagnosis of influenza from Nasopharyngeal swab specimens and should not be used as a sole basis for treatment. Nasal washings and aspirates are unacceptable for Xpert Xpress SARS-CoV-2/FLU/RSV testing.  Fact Sheet for Patients: EntrepreneurPulse.com.au  Fact Sheet for Healthcare Providers: IncredibleEmployment.be  This test is not yet approved or cleared by the [place] FDA and has been authorized for detection and/or diagnosis of SARS-CoV-2 by FDA under an Emergency Use Authorization (EUA). This EUA will remain in effect (meaning this test can be used) for the duration of the COVID-19 declaration under Section 564(b)(1) of the Act, 21 U.S.C. section 360bbb-3(b)(1), unless the authorization is terminated or revoked.  Performed at [facility], [place] [address] [place], [place] [address]   Blood culture (routine x 2)     Status: None (Preliminary result)   Collection Time: 03/19/21  4:11 PM   Specimen: BLOOD RIGHT HAND  Result Value Ref Range Status   Specimen Description BLOOD RIGHT HAND  Final   Special Requests   Final    BOTTLES DRAWN AEROBIC ONLY Blood Culture results may not be optimal due to an inadequate volume of blood received in culture bottles   Culture   Final    NO GROWTH 2 DAYS Performed at [facility], [place] [address], [place], [place] [address]    Report Status PENDING  Incomplete  Blood culture (routine x 2)     Status: None (Preliminary result)   Collection Time: 03/19/21  6:45 PM   Specimen: BLOOD LEFT HAND  Result Value Ref Range Status   Specimen Description BLOOD LEFT HAND  Final   Special Requests   Final    BOTTLES DRAWN AEROBIC ONLY Blood Culture adequate volume  Culture   Final    NO GROWTH 2 DAYS Performed at [facility], [place] [address], [place], [place] [address]    Report Status PENDING  Incomplete      Discharge Instructions:   Discharge Instructions     Diet - low sodium heart healthy   Complete by: As directed    Diet Carb Modified   Complete by: As directed    Increase activity slowly   Complete by: As directed       Allergies as of 03/21/2021   No Known Allergies      Medication List     STOP taking these medications    apixaban 5 MG Tabs tablet Commonly known as: ELIQUIS Replaced by: Apixaban Starter Pack (10mg  and 5mg )   aspirin EC 81 MG tablet   lisinopril 10 MG tablet Commonly known as: ZESTRIL   metFORMIN 500 MG tablet Commonly known as: GLUCOPHAGE       TAKE these medications    acetaminophen 500 MG tablet Commonly known as: TYLENOL Take 1,000 mg by mouth every 6 (six) hours as needed for mild pain or headache.   albuterol 108 (90 Base) MCG/ACT inhaler Commonly known as: VENTOLIN HFA Inhale 2 puffs into the lungs daily as needed for wheezing or shortness of breath.   Apixaban Starter Pack (10mg  and 5mg ) Commonly known as: ELIQUIS STARTER PACK Take as directed on package: start with two-5mg  tablets twice daily for 7 days. On day 8, switch to one-5mg  tablet twice daily. Replaces: apixaban 5 MG Tabs tablet   atorvastatin 40 MG tablet Commonly known as: LIPITOR Take 40 mg by mouth every evening.   brimonidine 0.2 % ophthalmic solution Commonly known as: ALPHAGAN Place 1 drop into both eyes 2 (two) times daily.   cyclobenzaprine 10 MG tablet Commonly known as: FLEXERIL Take 10 mg by mouth 3 (three) times daily.   Dermacloud Oint Apply 1 application topically See admin instructions. Apply topically to sacral area and buttocks every shift for barrier protection   docusate sodium 100 MG capsule Commonly known as: COLACE Take 100 mg by mouth 2 (two) times daily.   dorzolamide 2 % ophthalmic solution Commonly known as: TRUSOPT Place 1 drop into both eyes 2 (two) times daily.   feeding supplement (PRO-STAT 64) Liqd Take 30 mLs by  mouth every morning.   finasteride 5 MG tablet Commonly known as: PROSCAR Take 5 mg by mouth every morning.   insulin aspart 100 UNIT/ML injection Commonly known as: novoLOG 0-15 Units, Subcutaneous, 3 times daily with meals CBG < 70: Implement Hypoglycemia measures CBG 70 - 120: 0 units CBG 121 - 150: 2 units CBG 151 - 200: 3 units CBG 201 - 250: 5 units CBG 251 - 300: 8 units CBG 301 - 350: 11 units CBG 351 - 400: 15 units CBG > 400: call MD What changed:  how much to take how to take this when to take this additional instructions   latanoprost 0.005 % ophthalmic solution Commonly known as: XALATAN Place 1 drop into the right eye at bedtime.   metoprolol tartrate 25 MG tablet Commonly known as: LOPRESSOR Take 25 mg by mouth 2 (two) times daily.   multivitamin with minerals Tabs tablet Take 1 tablet by mouth every morning. Centrum Silver   pantoprazole 40 MG tablet Commonly known as: PROTONIX Take 40 mg by mouth daily before breakfast.   polyethylene glycol 17 g packet Commonly known as: MIRALAX / GLYCOLAX Take 17 g by  mouth daily as needed (constipation).   tamsulosin 0.4 MG Caps capsule Commonly known as: FLOMAX Take 1 capsule (0.4 mg total) by mouth in the morning and at bedtime.   vitamin C 1000 MG tablet Take 1,000 mg by mouth every morning.          Time coordinating discharge: 35 min  Signed:  Geradine Girt DO  [facility] 03/21/2021, 8:50 AM

## 2021-03-21 NOTE — Progress Notes (Signed)
RN called report to RN at [facility].

## 2021-03-21 NOTE — Discharge Instructions (Signed)
Information on my medicine - ELIQUIS (apixaban)  This medication education was reviewed with me or my healthcare representative as part of my discharge preparation.    Why was Eliquis prescribed for you? Eliquis was prescribed to treat blood clots that may have been found in the veins of your legs (deep vein thrombosis) or in your lungs (pulmonary embolism) and to reduce the risk of them occurring again.  What do You need to know about Eliquis ? The starting dose is 10 mg (two 5 mg tablets) taken TWICE daily for the FIRST SEVEN (7) DAYS (begun 03/20/21 at the hospital), then on 03/27/21  the dose is reduced to ONE 5 mg tablet taken TWICE daily.  Eliquis may be taken with or without food.   Try to take the dose about the same time in the morning and in the evening. If you have difficulty swallowing the tablet whole please discuss with your pharmacist how to take the medication safely.  Take Eliquis exactly as prescribed and DO NOT stop taking Eliquis without talking to the doctor who prescribed the medication.  Stopping may increase your risk of developing a new blood clot.  Refill your prescription before you run out.  After discharge, you should have regular check-up appointments with your healthcare provider that is prescribing your Eliquis.    What do you do if you miss a dose? If a dose of ELIQUIS is not taken at the scheduled time, take it as soon as possible on the same day and twice-daily administration should be resumed. The dose should not be doubled to make up for a missed dose.  Important Safety Information A possible side effect of Eliquis is bleeding. You should call your healthcare provider right away if you experience any of the following: Bleeding from an injury or your nose that does not stop. Unusual colored urine (red or dark brown) or unusual colored stools (red or black). Unusual bruising for unknown reasons. A serious fall or if you hit your head (even if there is  no bleeding).  Some medicines may interact with Eliquis and might increase your risk of bleeding or clotting while on Eliquis. To help avoid this, consult your healthcare provider or pharmacist prior to using any new prescription or non-prescription medications, including herbals, vitamins, non-steroidal anti-inflammatory drugs (NSAIDs) and supplements.  This website has more information on Eliquis (apixaban): http://www.eliquis.com/eliquis/home

## 2021-03-21 NOTE — Progress Notes (Signed)
Progress Note  Patient Name: Michael Jensen Date of Encounter: 03/21/2021  [facility] Cardiologist: Dr Johnsie Cancel  Subjective   No CP or dyspnea  Inpatient Medications    Scheduled Meds:  apixaban  10 mg Oral BID   Followed by   Derrill Memo ON 03/27/2021] apixaban  5 mg Oral BID   atorvastatin  40 mg Oral Daily   brimonidine  1 drop Both Eyes BID   cyclobenzaprine  10 mg Oral TID   docusate sodium  100 mg Oral BID   dorzolamide  1 drop Both Eyes BID   finasteride  5 mg Oral q morning   insulin aspart  0-9 Units Subcutaneous TID WC   latanoprost  1 drop Right Eye QHS   metoprolol tartrate  25 mg Oral BID WC   multivitamin with minerals  1 tablet Oral q morning   pantoprazole  40 mg Oral QAC breakfast   sodium chloride flush  3 mL Intravenous Q12H   tamsulosin  0.4 mg Oral BID   Continuous Infusions:  sodium chloride Stopped (03/19/21 1620)   lactated ringers 75 mL/hr at 03/20/21 2052   PRN Meds: sodium chloride, acetaminophen **OR** acetaminophen, adenosine, albuterol, bisacodyl, hydrALAZINE, morphine injection, ondansetron **OR** ondansetron (ZOFRAN) IV, polyethylene glycol   Vital Signs    Vitals:   03/20/21 1909 03/21/21 0053 03/21/21 0517 03/21/21 0737  BP: 110/61 96/68 (!) 105/49 109/63  Pulse: 99 99 91 100  Resp: 20 20 17 15   Temp: 98.7 F (37.1 C) 97.9 F (36.6 C) 97.9 F (36.6 C) 98.2 F (36.8 C)  TempSrc: Oral Oral Oral Oral  SpO2: 98% 97% 96% 99%  Weight:  89 kg    Height:        Intake/Output Summary (Last 24 hours) at 03/21/2021 0800 Last data filed at 03/21/2021 0757 Gross per 24 hour  Intake 980 ml  Output 775 ml  Net 205 ml   Last 3 Weights 03/21/2021 03/20/2021 03/19/2021  Weight (lbs) 196 lb 3.4 oz 194 lb 0.1 oz 186 lb 11.7 oz  Weight (kg) 89 kg 88 kg 84.7 kg      Telemetry    Sinus with run of SVT yesterday.- Personally Reviewed   Physical Exam   GEN: No acute distress.   Neck: No JVD Cardiac: RRR, no murmurs, rubs, or gallops.   Respiratory: Clear to auscultation bilaterally. GI: Soft, nontender, non-distended  MS: No edema Neuro:  Nonfocal; answers questions appropriately Psych: Normal affect   Labs    High Sensitivity Troponin:   Recent Labs  Lab 03/19/21 1412 03/19/21 1612  TROPONINIHS 1,150* 1,317*     Chemistry Recent Labs  Lab 03/19/21 1412 03/20/21 0814 03/21/21 0431  NA 136 138 134*  K 4.0 3.9 3.4*  CL 108 106 105  CO2 19* 23 22  GLUCOSE 208* 173* 157*  BUN 27* 23 14  CREATININE 1.14 0.76 0.71  CALCIUM 8.4* 8.6* 7.7*  MG 1.6*  --  1.8  GFRNONAA >60 >60 >60  ANIONGAP 9 9 7      Hematology Recent Labs  Lab 03/19/21 1412 03/20/21 0814 03/21/21 0431  WBC 11.1* 11.0* 7.8  RBC 4.54 4.31 3.89*  HGB 12.2* 11.3* 10.5*  HCT 38.1* 35.4* 31.4*  MCV 83.9 82.1 80.7  MCH 26.9 26.2 27.0  MCHC 32.0 31.9 33.4  RDW 16.0* 16.0* 15.9*  PLT PLATELET CLUMPS NOTED ON SMEAR, UNABLE TO ESTIMATE 102* 96*   Thyroid  Recent Labs  Lab 03/19/21 1413  TSH 1.908  Radiology    DG Chest Portable 1 View  Result Date: 03/19/2021 CLINICAL DATA:  Breathing problems. EXAM: PORTABLE CHEST 1 VIEW COMPARISON:  03/01/2021 FINDINGS: Cardiac silhouette is normal in size. No mediastinal or hilar masses. Clear lungs.  No convincing pleural effusion.  No pneumothorax. Skeletal structures are grossly intact. IMPRESSION: No active disease. Electronically Signed   By: Lajean Manes M.D.   On: 03/19/2021 14:51   ECHOCARDIOGRAM COMPLETE  Result Date: 03/20/2021    ECHOCARDIOGRAM REPORT   Patient Name:   Michael Jensen Date of Exam: 03/20/2021 Medical Rec #:  [id number]     Height:       70.0 in Accession #:    [id number]    Weight:       235.9 lb Date of Birth:  [birth year]     BSA:          2.239 m Patient Age:    86 years      BP:           107/75 mmHg Patient Gender: M             HR:           99 bpm. Exam Location:  Inpatient Procedure: 2D Echo, Cardiac Doppler and Color Doppler Indications:    Paroxysmal SVT  (supraventricular tachycardia) (HCC)  History:        Patient has no prior history of Echocardiogram examinations.                 Risk Factors:Hypertension, Diabetes and Dyslipidemia.  Sonographer:    Alvino Chapel RCS Referring Phys: [place]  1. Left ventricular ejection fraction, by estimation, is 60 to 65%. The left ventricle has normal function. The left ventricle has no regional wall motion abnormalities. There is mild left ventricular hypertrophy. Left ventricular diastolic parameters are indeterminate.  2. Right ventricular systolic function is normal. The right ventricular size is normal.  3. The mitral valve is normal in structure. No evidence of mitral valve regurgitation. No evidence of mitral stenosis.  4. The aortic valve is tricuspid. Aortic valve regurgitation is not visualized. No aortic stenosis is present.  5. Aortic dilatation noted. There is mild dilatation of the aortic root, measuring 40 mm. FINDINGS  Left Ventricle: Left ventricular ejection fraction, by estimation, is 60 to 65%. The left ventricle has normal function. The left ventricle has no regional wall motion abnormalities. The left ventricular internal cavity size was normal in size. There is  mild left ventricular hypertrophy. Left ventricular diastolic parameters are indeterminate. Right Ventricle: Cannot estimate PASP, IVC poorly visualized. The right ventricular size is normal. No increase in right ventricular wall thickness. Right ventricular systolic function is normal. Left Atrium: Left atrial size was normal in size. Right Atrium: Right atrial size was normal in size. Pericardium: There is no evidence of pericardial effusion. Mitral Valve: The mitral valve is normal in structure. No evidence of mitral valve regurgitation. No evidence of mitral valve stenosis. Tricuspid Valve: The tricuspid valve is normal in structure. Tricuspid valve regurgitation is not demonstrated. No evidence of tricuspid stenosis.  Aortic Valve: The aortic valve is tricuspid. Aortic valve regurgitation is not visualized. No aortic stenosis is present. Aortic valve mean gradient measures 4.7 mmHg. Aortic valve peak gradient measures 8.4 mmHg. Aortic valve area, by VTI measures 2.96 cm. Pulmonic Valve: The pulmonic valve was not well visualized. Pulmonic valve regurgitation is mild. No evidence of pulmonic stenosis. Aorta: Aortic dilatation noted. There is mild  dilatation of the aortic root, measuring 40 mm. IAS/Shunts: The interatrial septum was not well visualized.  LEFT VENTRICLE PLAX 2D LVIDd:         4.60 cm   Diastology LVIDs:         2.90 cm   LV e' medial:    7.96 cm/s LV PW:         1.00 cm   LV E/e' medial:  11.0 LV IVS:        1.10 cm   LV e' lateral:   8.95 cm/s LVOT diam:     2.10 cm   LV E/e' lateral: 9.8 LV SV:         79 LV SV Index:   35 LVOT Area:     3.46 cm  RIGHT VENTRICLE RV S prime:     21.60 cm/s TAPSE (M-mode): 2.2 cm LEFT ATRIUM             Index        RIGHT ATRIUM           Index LA diam:        3.70 cm 1.65 cm/m   RA Area:     12.10 cm LA Vol (A2C):   57.4 ml 25.64 ml/m  RA Volume:   23.90 ml  10.67 ml/m LA Vol (A4C):   50.6 ml 22.60 ml/m LA Biplane Vol: 54.5 ml 24.34 ml/m  AORTIC VALVE AV Area (Vmax):    2.70 cm AV Area (Vmean):   2.78 cm AV Area (VTI):     2.96 cm AV Vmax:           144.78 cm/s AV Vmean:          102.482 cm/s AV VTI:            0.267 m AV Peak Grad:      8.4 mmHg AV Mean Grad:      4.7 mmHg LVOT Vmax:         113.00 cm/s LVOT Vmean:        82.400 cm/s LVOT VTI:          0.228 m LVOT/AV VTI ratio: 0.85  AORTA Ao Root diam: 4.00 cm MITRAL VALVE               TRICUSPID VALVE MV Area (PHT): 3.42 cm    TR Peak grad:   19.5 mmHg MV Decel Time: 222 msec    TR Vmax:        221.00 cm/s MV E velocity: 87.80 cm/s MV A velocity: 93.20 cm/s  SHUNTS MV E/A ratio:  0.94        Systemic VTI:  0.23 m                            Systemic Diam: 2.10 cm Carlyle Dolly MD Electronically signed by Carlyle Dolly MD Signature Date/Time: 03/20/2021/1:45:48 PM    Final    VAS [place] LOWER EXTREMITY VENOUS (DVT)  Result Date: 03/20/2021  Lower Venous DVT Study Patient Name:  Michael Jensen  Date of Exam:   03/20/2021 Medical Rec #: [id number]      Accession #:    [id number] Date of Birth: 1936-03-01      Patient Gender: M Patient Age:   86 years Exam Location:  [facility] Procedure:      VAS [place] LOWER EXTREMITY VENOUS (DVT) Referring Phys: Eulogio Bear --------------------------------------------------------------------------------  Indications: Edema.  Comparison  Study: 11/27/2020- negative lower extremity venous duplex Performing Technologist: Maudry Mayhew MHA, RDMS, RVT, RDCS  Examination Guidelines: A complete evaluation includes B-mode imaging, spectral Doppler, color Doppler, and power Doppler as needed of all accessible portions of each vessel. Bilateral testing is considered an integral part of a complete examination. Limited examinations for reoccurring indications may be performed as noted. The reflux portion of the exam is performed with the patient in reverse Trendelenburg.  +---------+---------------+---------+-----------+----------+--------------+  RIGHT     Compressibility Phasicity Spontaneity Properties Thrombus Aging  +---------+---------------+---------+-----------+----------+--------------+  CFV       Full            Yes       Yes                                    +---------+---------------+---------+-----------+----------+--------------+  SFJ       Full                                                             +---------+---------------+---------+-----------+----------+--------------+  FV Prox   Full                                                             +---------+---------------+---------+-----------+----------+--------------+  FV Mid    Full                                                             +---------+---------------+---------+-----------+----------+--------------+  FV  Distal Full                                                             +---------+---------------+---------+-----------+----------+--------------+  PFV       Full                                                             +---------+---------------+---------+-----------+----------+--------------+  POP       Full            Yes       Yes                                    +---------+---------------+---------+-----------+----------+--------------+  PTV       Full                                                             +---------+---------------+---------+-----------+----------+--------------+  PERO      Full                                                             +---------+---------------+---------+-----------+----------+--------------+   Right Technical Findings: Not visualized segments include Limited evaluation PTV and peroneal veins.  +---------+---------------+---------+-----------+----------+--------------+  LEFT      Compressibility Phasicity Spontaneity Properties Thrombus Aging  +---------+---------------+---------+-----------+----------+--------------+  CFV       Full            Yes       Yes                                    +---------+---------------+---------+-----------+----------+--------------+  SFJ       Full                                                             +---------+---------------+---------+-----------+----------+--------------+  FV Prox   Full                                                             +---------+---------------+---------+-----------+----------+--------------+  FV Mid    Full                                                             +---------+---------------+---------+-----------+----------+--------------+  FV Distal Full                                                             +---------+---------------+---------+-----------+----------+--------------+  PFV       Full                                                              +---------+---------------+---------+-----------+----------+--------------+  POP       Full            Yes       Yes                                    +---------+---------------+---------+-----------+----------+--------------+  PTV       Full                                                             +---------+---------------+---------+-----------+----------+--------------+  PERO      Full                                                             +---------+---------------+---------+-----------+----------+--------------+ Limited evaluation PTV and peroneal veins    Summary: RIGHT: - There is no evidence of deep vein thrombosis in the lower extremity. However, portions of this examination were limited- see technologist comments above.  - No cystic structure found in the popliteal fossa.  LEFT: - There is no evidence of deep vein thrombosis in the lower extremity. However, portions of this examination were limited- see technologist comments above.  - No cystic structure found in the popliteal fossa.  *See table(s) above for measurements and observations.    Preliminary      Patient Profile     86 y.o. male admitted with supraventricular tachycardia that was terminated with adenosine.  Echocardiogram shows normal LV function.  Also with elevated troponin and history of diabetes mellitus, hypertension and hyperlipidemia.  Assessment & Plan    1 supraventricular tachycardia-patient had an episode yesterday.  Would continue metoprolol.  Cannot advance as blood pressure is borderline.  If he has more frequent episodes in the future could consider amiodarone though note patient is in no CODE BLUE and family is asking for hospice care.  We will discontinue telemetry.  2 elevated troponin-possible demand ischemia in the setting of supraventricular tachycardia.  No plans for further ischemia evaluation given comorbidities and plan for hospice care.  Note LV function is normal.  3 hypertension-patient's blood  pressure is controlled.  Continue present medications.  Cardiology will sign off.  Would continue present medications at discharge.  Please call with questions.  For questions or updates, please contact [place] Please consult [email or web address] for contact info under        Signed, Kirk Ruths, MD  03/21/2021, 8:00 AM

## 2021-03-23 DIAGNOSIS — M4800 Spinal stenosis, site unspecified: Secondary | ICD-10-CM | POA: Diagnosis not present

## 2021-03-23 DIAGNOSIS — N390 Urinary tract infection, site not specified: Secondary | ICD-10-CM | POA: Diagnosis not present

## 2021-03-23 DIAGNOSIS — G629 Polyneuropathy, unspecified: Secondary | ICD-10-CM | POA: Diagnosis not present

## 2021-03-23 DIAGNOSIS — E119 Type 2 diabetes mellitus without complications: Secondary | ICD-10-CM | POA: Diagnosis not present

## 2021-03-23 DIAGNOSIS — I10 Essential (primary) hypertension: Secondary | ICD-10-CM | POA: Diagnosis not present

## 2021-03-23 DIAGNOSIS — M6281 Muscle weakness (generalized): Secondary | ICD-10-CM | POA: Diagnosis not present

## 2021-03-23 DIAGNOSIS — L89619 Pressure ulcer of right heel, unspecified stage: Secondary | ICD-10-CM | POA: Diagnosis not present

## 2021-03-23 DIAGNOSIS — K219 Gastro-esophageal reflux disease without esophagitis: Secondary | ICD-10-CM | POA: Diagnosis not present

## 2021-03-23 DIAGNOSIS — I82401 Acute embolism and thrombosis of unspecified deep veins of right lower extremity: Secondary | ICD-10-CM | POA: Diagnosis not present

## 2021-03-23 DIAGNOSIS — L89614 Pressure ulcer of right heel, stage 4: Secondary | ICD-10-CM | POA: Diagnosis not present

## 2021-03-24 LAB — CULTURE, BLOOD (ROUTINE X 2)
Culture: NO GROWTH
Culture: NO GROWTH
Special Requests: ADEQUATE

## 2021-03-30 DIAGNOSIS — L89614 Pressure ulcer of right heel, stage 4: Secondary | ICD-10-CM | POA: Diagnosis not present

## 2021-04-02 DIAGNOSIS — L89619 Pressure ulcer of right heel, unspecified stage: Secondary | ICD-10-CM | POA: Diagnosis not present

## 2021-04-02 DIAGNOSIS — H548 Legal blindness, as defined in USA: Secondary | ICD-10-CM | POA: Diagnosis not present

## 2021-04-02 DIAGNOSIS — M6281 Muscle weakness (generalized): Secondary | ICD-10-CM | POA: Diagnosis not present

## 2021-04-02 DIAGNOSIS — G629 Polyneuropathy, unspecified: Secondary | ICD-10-CM | POA: Diagnosis not present

## 2021-04-02 DIAGNOSIS — I10 Essential (primary) hypertension: Secondary | ICD-10-CM | POA: Diagnosis not present

## 2021-04-02 DIAGNOSIS — K219 Gastro-esophageal reflux disease without esophagitis: Secondary | ICD-10-CM | POA: Diagnosis not present

## 2021-04-04 DIAGNOSIS — M4800 Spinal stenosis, site unspecified: Secondary | ICD-10-CM | POA: Diagnosis not present

## 2021-04-04 DIAGNOSIS — E119 Type 2 diabetes mellitus without complications: Secondary | ICD-10-CM | POA: Diagnosis not present

## 2021-04-04 DIAGNOSIS — K219 Gastro-esophageal reflux disease without esophagitis: Secondary | ICD-10-CM | POA: Diagnosis not present

## 2021-04-04 DIAGNOSIS — M6281 Muscle weakness (generalized): Secondary | ICD-10-CM | POA: Diagnosis not present

## 2021-04-04 DIAGNOSIS — I10 Essential (primary) hypertension: Secondary | ICD-10-CM | POA: Diagnosis not present

## 2021-04-06 DIAGNOSIS — L89614 Pressure ulcer of right heel, stage 4: Secondary | ICD-10-CM | POA: Diagnosis not present

## 2021-04-08 DIAGNOSIS — M6281 Muscle weakness (generalized): Secondary | ICD-10-CM | POA: Diagnosis not present

## 2021-04-08 DIAGNOSIS — I82401 Acute embolism and thrombosis of unspecified deep veins of right lower extremity: Secondary | ICD-10-CM | POA: Diagnosis not present

## 2021-04-08 DIAGNOSIS — G629 Polyneuropathy, unspecified: Secondary | ICD-10-CM | POA: Diagnosis not present

## 2021-04-08 DIAGNOSIS — H548 Legal blindness, as defined in USA: Secondary | ICD-10-CM | POA: Diagnosis not present

## 2021-04-08 DIAGNOSIS — I10 Essential (primary) hypertension: Secondary | ICD-10-CM | POA: Diagnosis not present

## 2021-04-08 DIAGNOSIS — L89619 Pressure ulcer of right heel, unspecified stage: Secondary | ICD-10-CM | POA: Diagnosis not present

## 2021-04-13 DIAGNOSIS — L89614 Pressure ulcer of right heel, stage 4: Secondary | ICD-10-CM | POA: Diagnosis not present

## 2021-04-20 DIAGNOSIS — L89614 Pressure ulcer of right heel, stage 4: Secondary | ICD-10-CM | POA: Diagnosis not present

## 2021-04-23 DIAGNOSIS — L89619 Pressure ulcer of right heel, unspecified stage: Secondary | ICD-10-CM | POA: Diagnosis not present

## 2021-04-23 DIAGNOSIS — H548 Legal blindness, as defined in USA: Secondary | ICD-10-CM | POA: Diagnosis not present

## 2021-04-23 DIAGNOSIS — K5900 Constipation, unspecified: Secondary | ICD-10-CM | POA: Diagnosis not present

## 2021-04-23 DIAGNOSIS — M6281 Muscle weakness (generalized): Secondary | ICD-10-CM | POA: Diagnosis not present

## 2021-04-23 DIAGNOSIS — G629 Polyneuropathy, unspecified: Secondary | ICD-10-CM | POA: Diagnosis not present

## 2021-04-25 DIAGNOSIS — W19XXXD Unspecified fall, subsequent encounter: Secondary | ICD-10-CM | POA: Diagnosis not present

## 2021-04-25 DIAGNOSIS — F32A Depression, unspecified: Secondary | ICD-10-CM | POA: Diagnosis not present

## 2021-04-25 DIAGNOSIS — I10 Essential (primary) hypertension: Secondary | ICD-10-CM | POA: Diagnosis not present

## 2021-04-25 DIAGNOSIS — G629 Polyneuropathy, unspecified: Secondary | ICD-10-CM | POA: Diagnosis not present

## 2021-04-25 DIAGNOSIS — M4800 Spinal stenosis, site unspecified: Secondary | ICD-10-CM | POA: Diagnosis not present

## 2021-04-25 DIAGNOSIS — M6281 Muscle weakness (generalized): Secondary | ICD-10-CM | POA: Diagnosis not present

## 2021-04-27 DIAGNOSIS — R3914 Feeling of incomplete bladder emptying: Secondary | ICD-10-CM | POA: Diagnosis not present

## 2021-04-27 DIAGNOSIS — N401 Enlarged prostate with lower urinary tract symptoms: Secondary | ICD-10-CM | POA: Diagnosis not present

## 2021-04-29 DIAGNOSIS — M6281 Muscle weakness (generalized): Secondary | ICD-10-CM | POA: Diagnosis not present

## 2021-04-29 DIAGNOSIS — H548 Legal blindness, as defined in USA: Secondary | ICD-10-CM | POA: Diagnosis not present

## 2021-04-29 DIAGNOSIS — G629 Polyneuropathy, unspecified: Secondary | ICD-10-CM | POA: Diagnosis not present

## 2021-04-29 DIAGNOSIS — K5900 Constipation, unspecified: Secondary | ICD-10-CM | POA: Diagnosis not present

## 2021-04-29 DIAGNOSIS — I10 Essential (primary) hypertension: Secondary | ICD-10-CM | POA: Diagnosis not present

## 2021-04-29 DIAGNOSIS — I82401 Acute embolism and thrombosis of unspecified deep veins of right lower extremity: Secondary | ICD-10-CM | POA: Diagnosis not present

## 2021-04-29 DIAGNOSIS — K219 Gastro-esophageal reflux disease without esophagitis: Secondary | ICD-10-CM | POA: Diagnosis not present

## 2021-05-10 ENCOUNTER — Emergency Department (HOSPITAL_COMMUNITY)

## 2021-05-10 ENCOUNTER — Emergency Department (HOSPITAL_COMMUNITY)
Admission: EM | Admit: 2021-05-10 | Discharge: 2021-05-11 | Disposition: A | Discharge status: Skilled Nursing Facility | Attending: Emergency Medicine | Admitting: Emergency Medicine

## 2021-05-10 ENCOUNTER — Encounter (HOSPITAL_COMMUNITY): Payer: Self-pay

## 2021-05-10 DIAGNOSIS — S0990XA Unspecified injury of head, initial encounter: Secondary | ICD-10-CM

## 2021-05-10 DIAGNOSIS — W010XXA Fall on same level from slipping, tripping and stumbling without subsequent striking against object, initial encounter: Secondary | ICD-10-CM | POA: Insufficient documentation

## 2021-05-10 DIAGNOSIS — W19XXXA Unspecified fall, initial encounter: Secondary | ICD-10-CM | POA: Diagnosis not present

## 2021-05-10 DIAGNOSIS — S0083XA Contusion of other part of head, initial encounter: Secondary | ICD-10-CM | POA: Insufficient documentation

## 2021-05-10 DIAGNOSIS — Z7902 Long term (current) use of antithrombotics/antiplatelets: Secondary | ICD-10-CM | POA: Diagnosis not present

## 2021-05-10 LAB — CBC WITH DIFFERENTIAL/PLATELET
Abs Immature Granulocytes: 0.01 10*3/uL (ref 0.00–0.07)
Basophils Absolute: 0 10*3/uL (ref 0.0–0.1)
Basophils Relative: 0 %
Eosinophils Absolute: 0.1 10*3/uL (ref 0.0–0.5)
Eosinophils Relative: 2 %
HCT: 38.3 % — ABNORMAL LOW (ref 39.0–52.0)
Hemoglobin: 12 g/dL — ABNORMAL LOW (ref 13.0–17.0)
Immature Granulocytes: 0 %
Lymphocytes Relative: 30 %
Lymphs Abs: 1.4 10*3/uL (ref 0.7–4.0)
MCH: 26.4 pg (ref 26.0–34.0)
MCHC: 31.3 g/dL (ref 30.0–36.0)
MCV: 84.2 fL (ref 80.0–100.0)
Monocytes Absolute: 0.5 10*3/uL (ref 0.1–1.0)
Monocytes Relative: 10 %
Neutro Abs: 2.6 10*3/uL (ref 1.7–7.7)
Neutrophils Relative %: 58 %
Platelets: 134 10*3/uL — ABNORMAL LOW (ref 150–400)
RBC: 4.55 MIL/uL (ref 4.22–5.81)
RDW: 15.3 % (ref 11.5–15.5)
WBC: 4.6 10*3/uL (ref 4.0–10.5)
nRBC: 0 % (ref 0.0–0.2)

## 2021-05-10 LAB — I-STAT CHEM 8, ED
BUN: 18 mg/dL (ref 8–23)
Calcium, Ion: 1.12 mmol/L — ABNORMAL LOW (ref 1.15–1.40)
Chloride: 104 mmol/L (ref 98–111)
Creatinine, Ser: 0.8 mg/dL (ref 0.61–1.24)
Glucose, Bld: 226 mg/dL — ABNORMAL HIGH (ref 70–99)
HCT: 39 % (ref 39.0–52.0)
Hemoglobin: 13.3 g/dL (ref 13.0–17.0)
Potassium: 4.1 mmol/L (ref 3.5–5.1)
Sodium: 138 mmol/L (ref 135–145)
TCO2: 23 mmol/L (ref 22–32)

## 2021-05-10 LAB — BASIC METABOLIC PANEL
Anion gap: 8 (ref 5–15)
BUN: 17 mg/dL (ref 8–23)
CO2: 23 mmol/L (ref 22–32)
Calcium: 8.8 mg/dL — ABNORMAL LOW (ref 8.9–10.3)
Chloride: 105 mmol/L (ref 98–111)
Creatinine, Ser: 0.88 mg/dL (ref 0.61–1.24)
GFR, Estimated: >60 mL/min (ref >60)
Glucose, Bld: 230 mg/dL — ABNORMAL HIGH (ref 70–99)
Potassium: 4.2 mmol/L (ref 3.5–5.1)
Sodium: 136 mmol/L (ref 135–145)

## 2021-05-10 IMAGING — DX DG PORTABLE PELVIS
2 series · 2 of 2 positions shown · non-contrast
Comparison: None.

CLINICAL DATA: Recent fall with pelvic pain, initial encounter

EXAM:
PORTABLE PELVIS 2 VIEWS

[pelvis ap (1 of 2)]
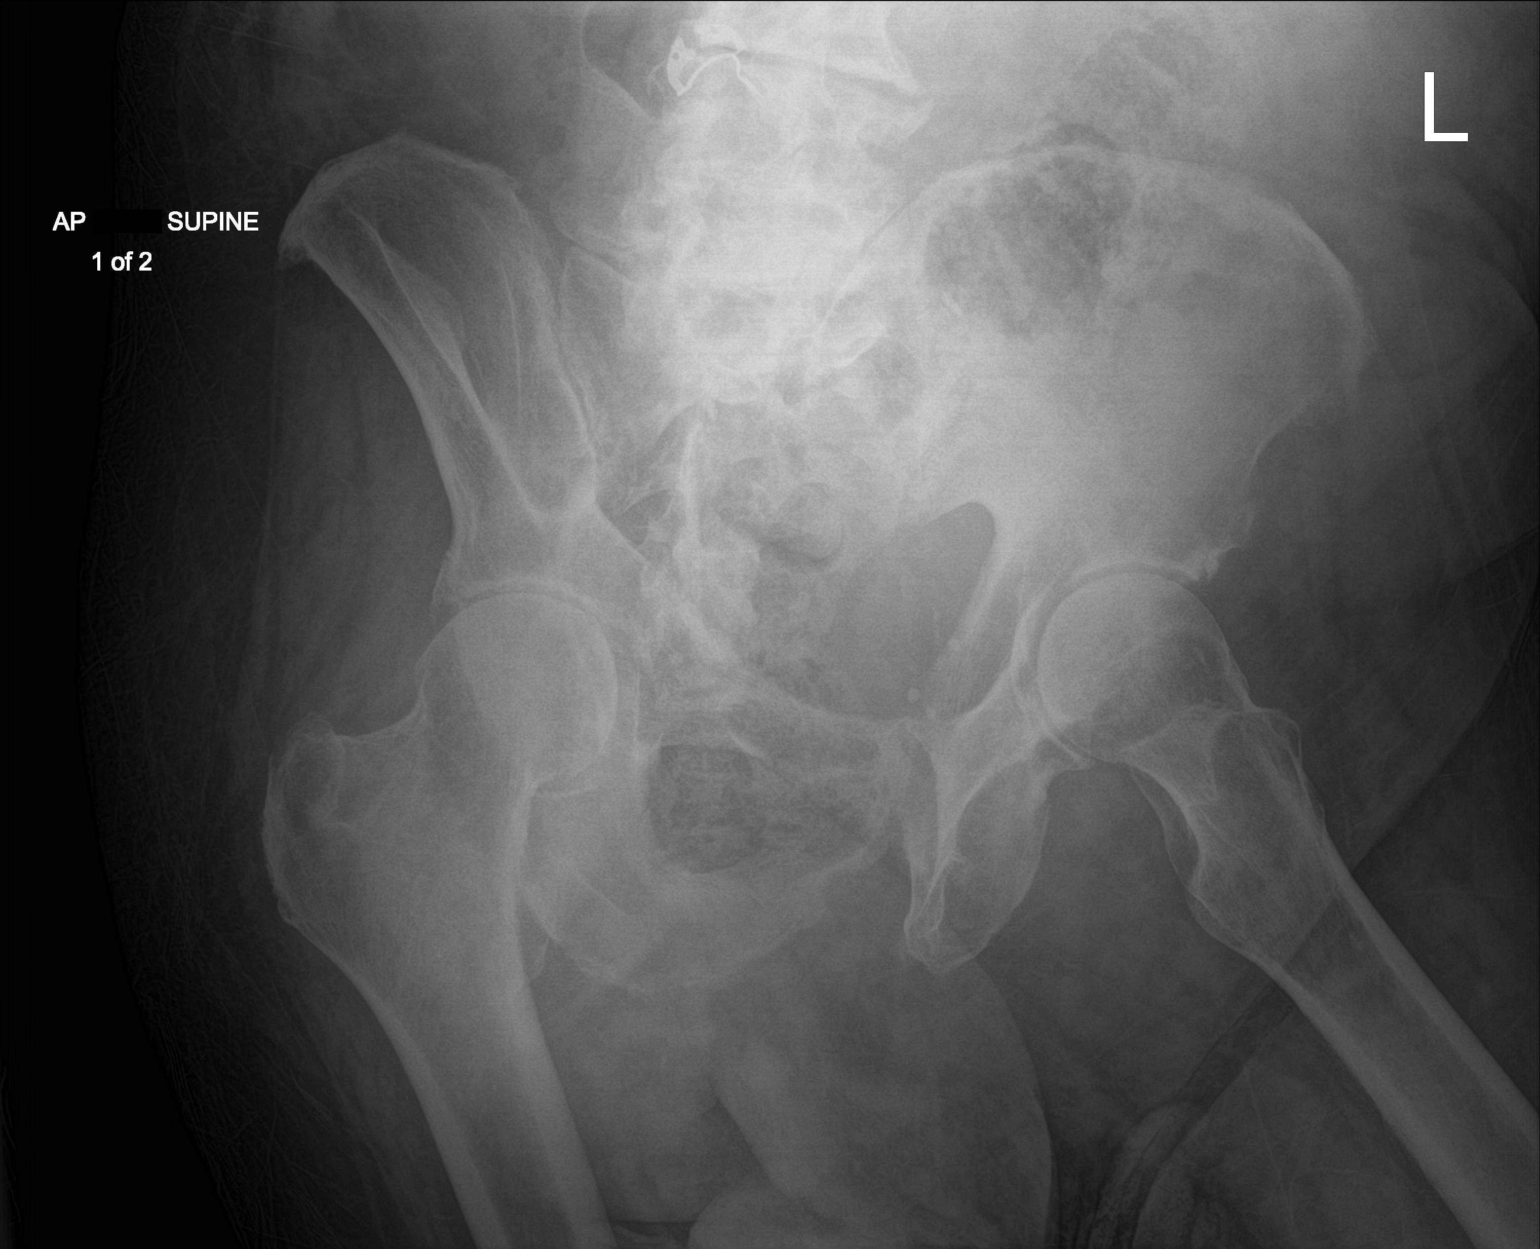

[pelvis ap (2 of 2)]
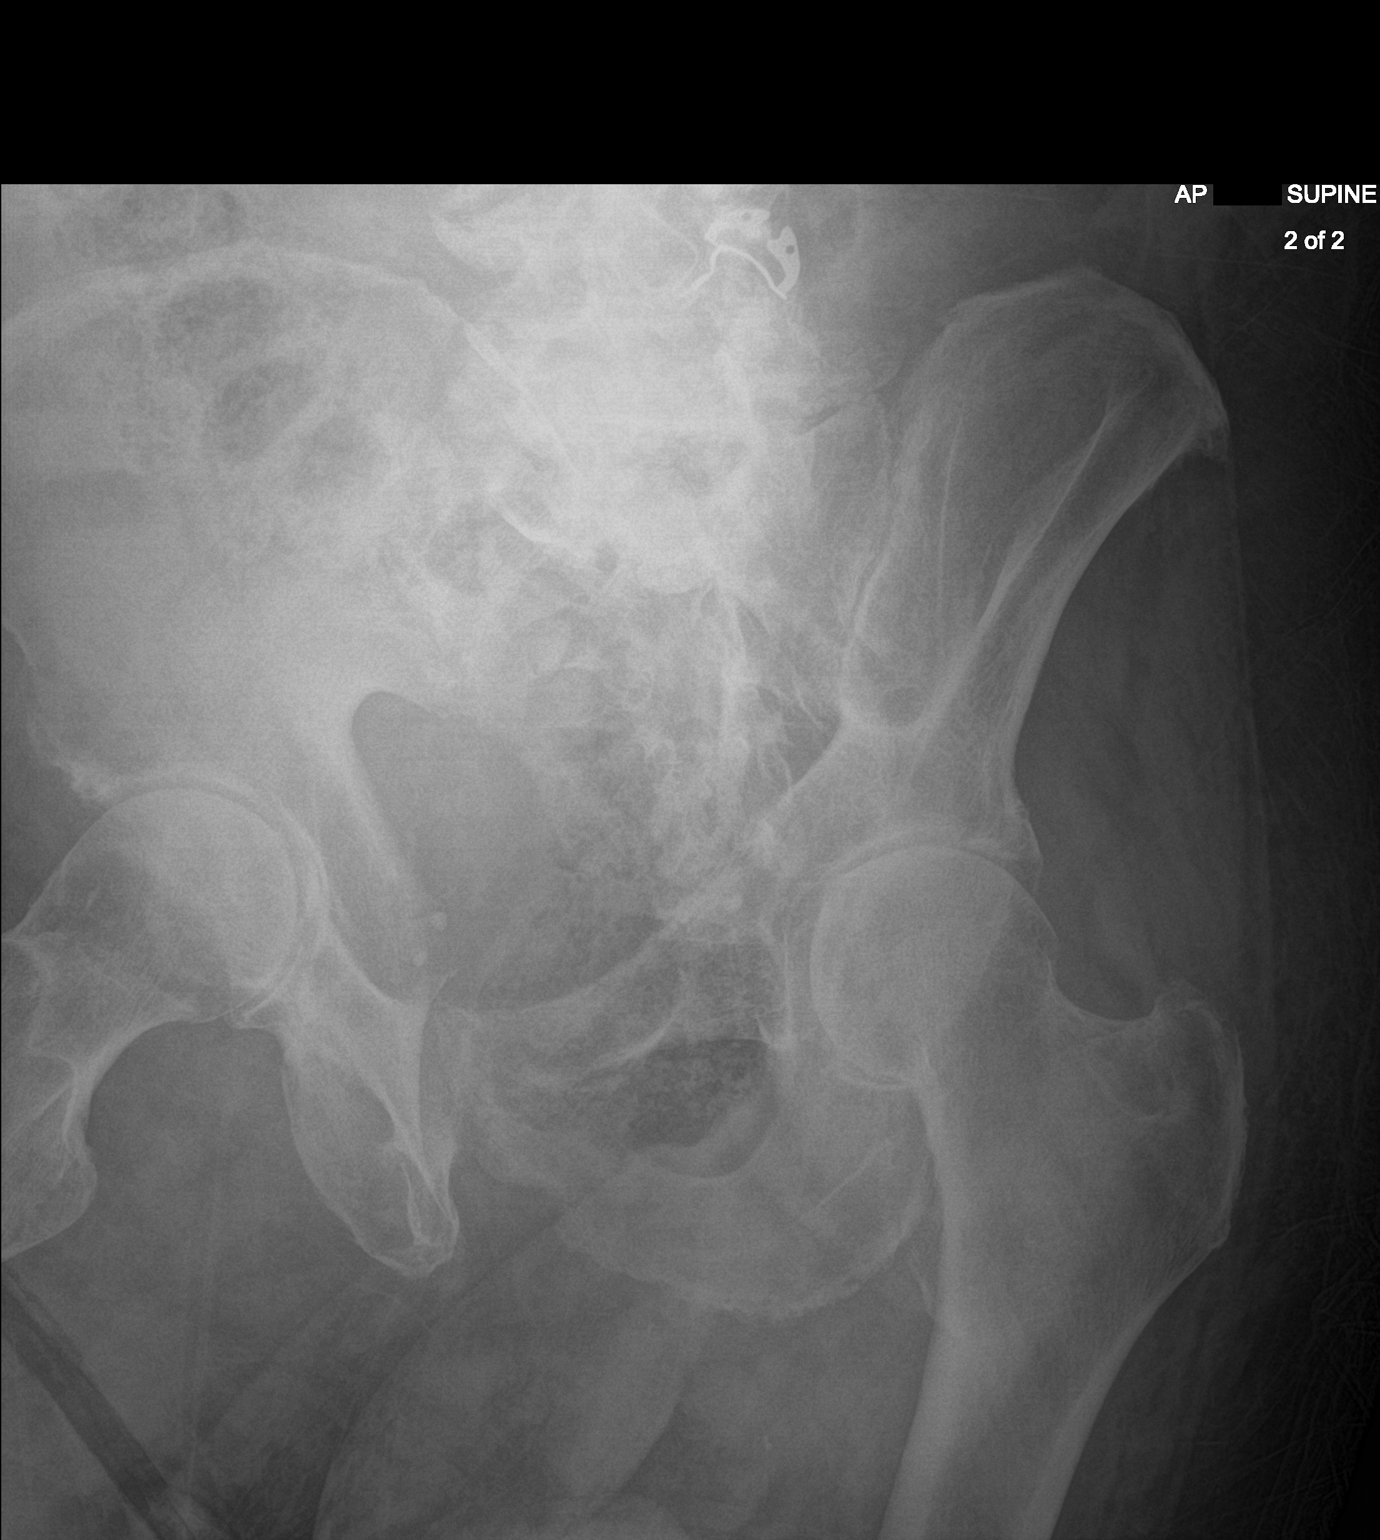

[2 of 2 positions shown; findings below may reference images not displayed]

FINDINGS: Examination is limited somewhat due to patient rotation. No acute
fracture or dislocation is noted. Degenerative changes of lumbar
spine are seen. No soft tissue abnormality is noted.
IMPRESSION: Degenerative change without acute abnormality.

## 2021-05-10 IMAGING — CT CT CERVICAL SPINE W/O CM
2 series · 10 of 28 positions shown, 13 images · non-contrast
Comparison: Head CT dated [DATE], cervical spine CT dated
[DATE]

CLINICAL DATA: Head trauma with neck pain.



[Series 6: c spine soft · axial · 0.41mm/px · z∈[-232,-126]mm · 5 of 77 slices shown, 7 images]
[im 12/77  soft-tissue]
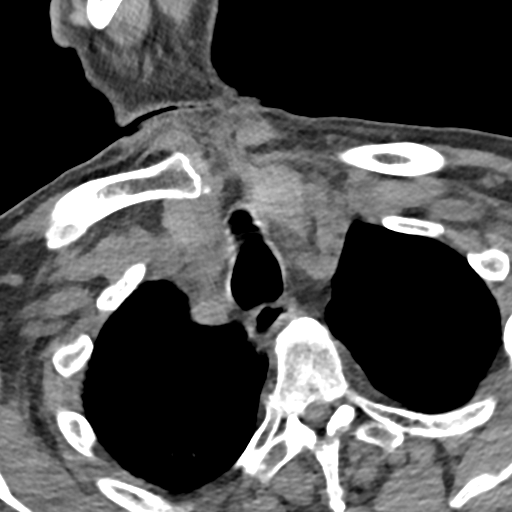
[im 12/77  bone]
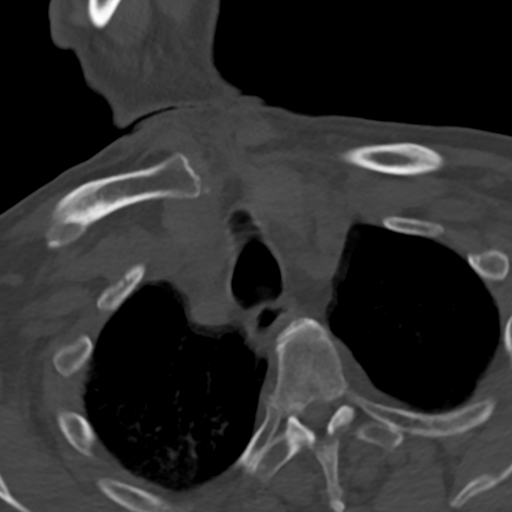
[im 24/77  bone]
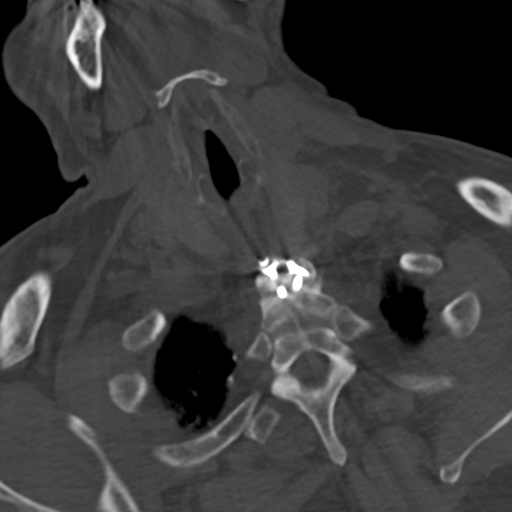
[im 41/77  bone]
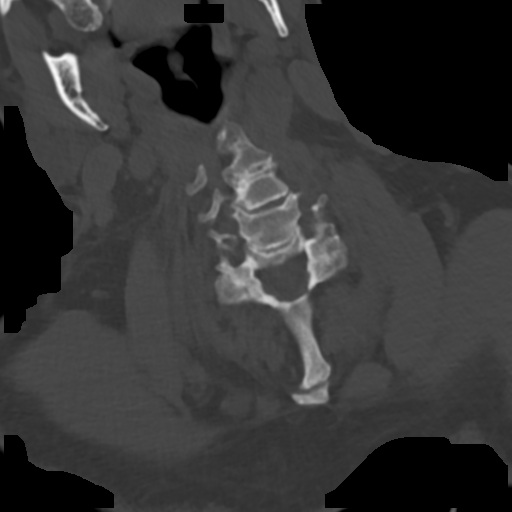
[im 53/77  bone]
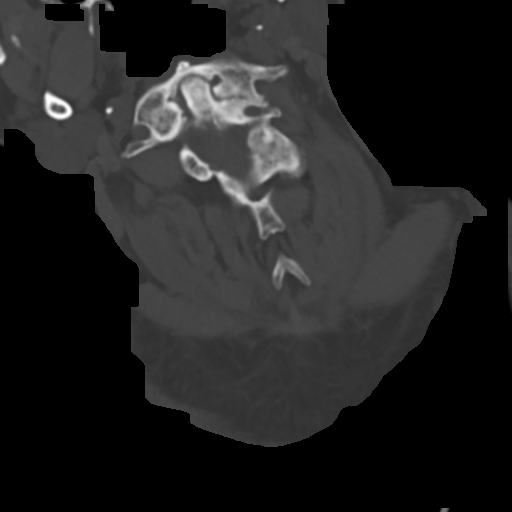
[im 65/77  soft-tissue]
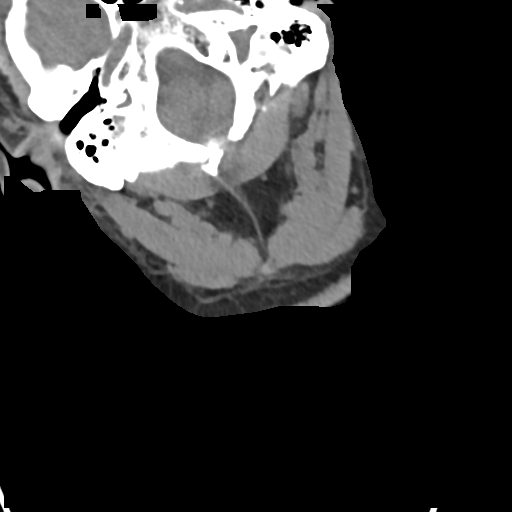
[im 65/77  bone]
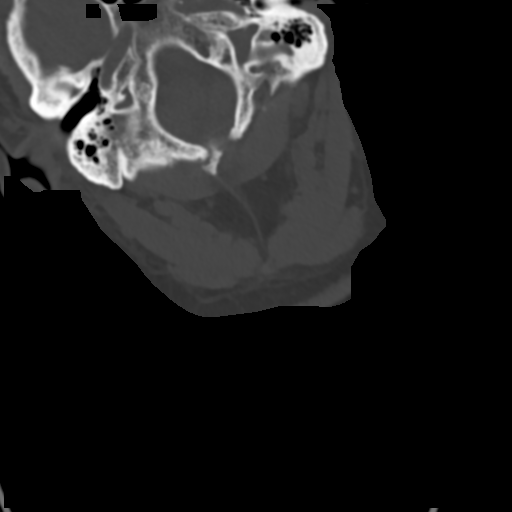

[Series 9: sag bone · sagittal · 0.29mm/px · 5 of 80 slices shown, 6 images]
[im 27/80  bone]
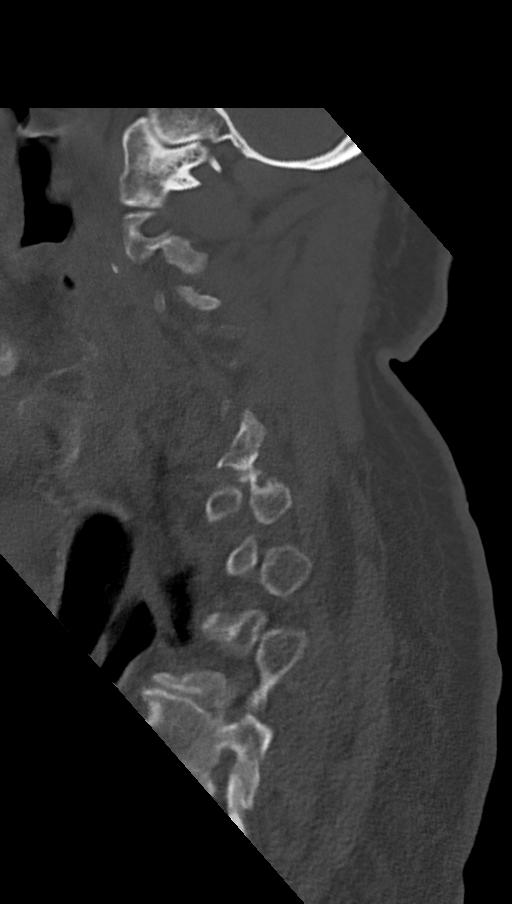
[im 33/80  bone]
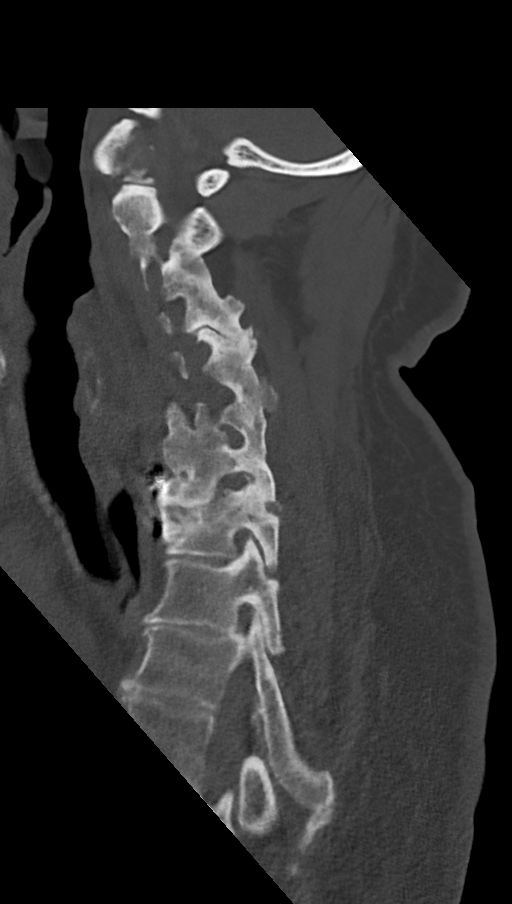
[im 40/80  soft-tissue]
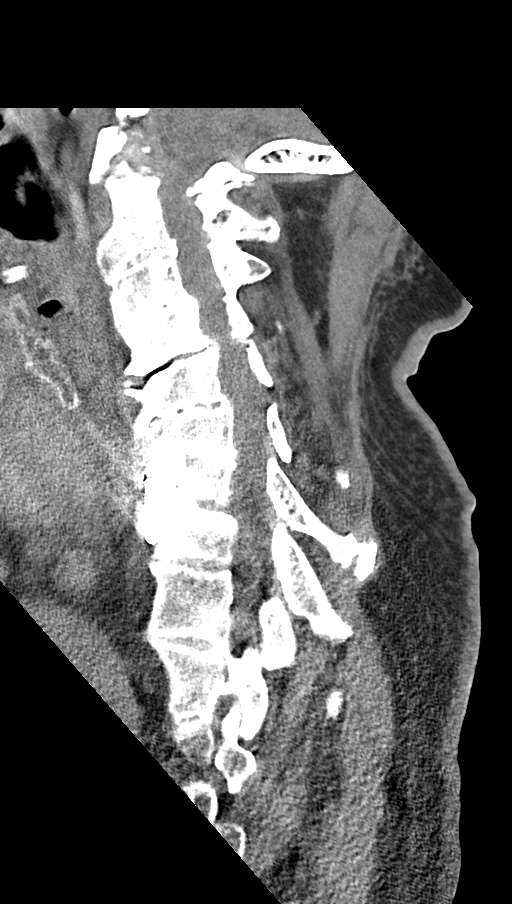
[im 40/80  bone]
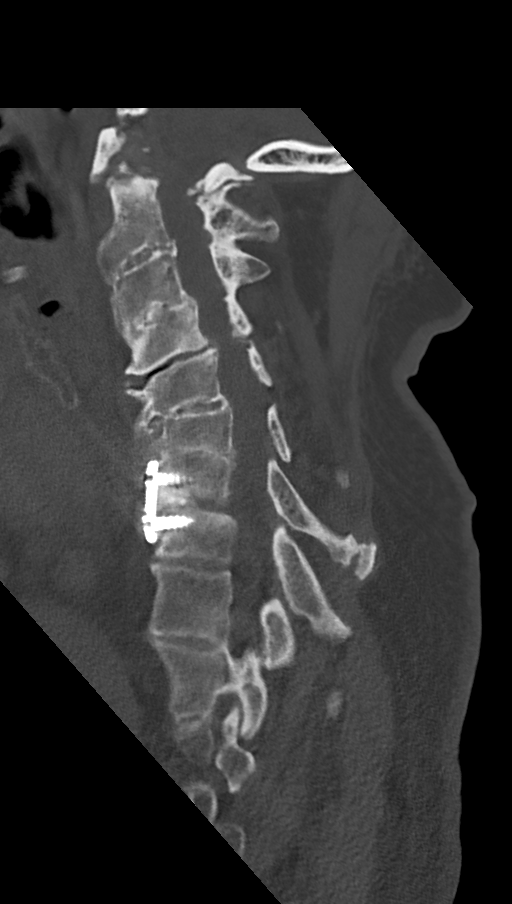
[im 47/80  bone]
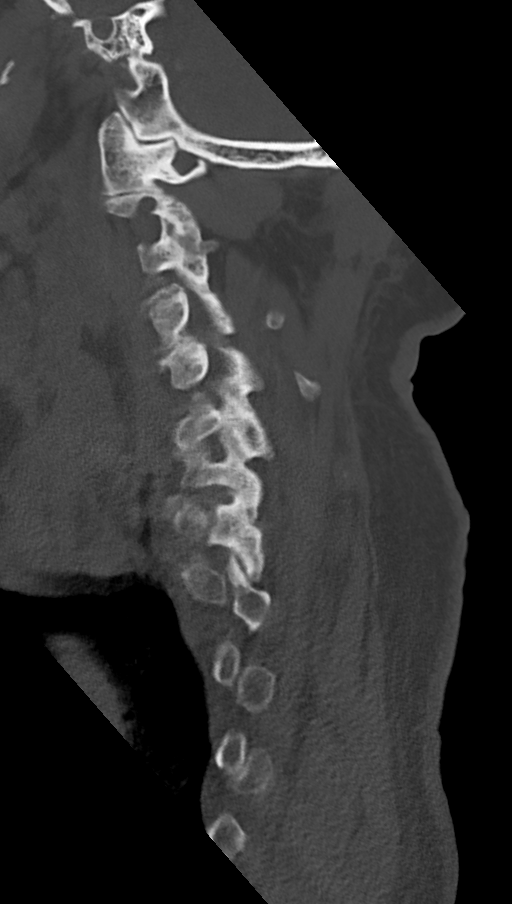
[im 53/80  bone]
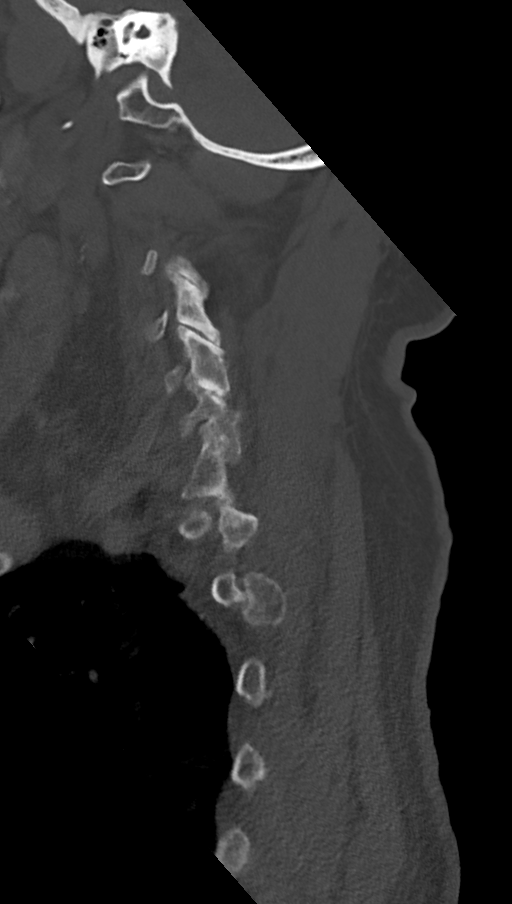

[10 of 28 positions shown; findings below may reference images not displayed]

FINDINGS: CT HEAD FINDINGS

Brain: There is moderately developed cerebral atrophy, small vessel
disease and mild atrophic ventriculomegaly with relatively mild
cerebellar atrophy. An old cortical infarct is again noted in the
posterior right temporal lobe.

There are few tiny chronic bilateral gangliocapsular lacunar
infarcts. No asymmetry is seen worrisome for acute infarct,
hemorrhage or mass. There is no midline shift. Basal cisterns are
patent. There are dystrophic calcifications in the frontal falx.

Vascular: There are calcifications in both distal vertebral arteries
and siphons but no hyperdense central vasculature.

Skull: There is a small right frontal scalp hematoma. The calvarium
and skull base are intact without focal bone lesions.

Sinuses/Orbits: Evidence of prior left lens replacement. Visualized
sinuses and mastoid air cells are clear.

Other: None.

CT CERVICAL SPINE FINDINGS

Alignment: There is a mild cervical kypholevoscoliosis, and again
noted 3 mm grade 1 degenerative anterolisthesis at C4-5 and C7-T1
with interval anterior fusion plating with an interbody disc space
apparatus at this level. There is no evidence of acute hardware
complication or loosening.

The posterior alignment is unchanged and otherwise unremarkable.
Narrowing and moderate osteophytosis of the anterior atlantodental
joint is again shown.

Skull base and vertebrae: There is chronic C7 spinous process tip
fracture with nonunion, unchanged. There is osteopenia with no
evidence of acute cervical fracture.

Soft tissues and spinal canal: No prevertebral fluid or swelling. No
visible canal hematoma.

Disc levels: Asymmetric advanced degenerative arthrosis and
bone-on-bone apposition are again noted of the left C1-2 lateral
mass articulations, with osteophytes.

Bony ankylosis seen as before across the C2-3 vertebral bodies and
facet joints and across the C3-4 vertebral bodies and right facet
joint.

There is chronic disc collapse and bidirectional osteophytes at C4-5
and C5-6 with anterior ankylosis at C5-6, and bony ankylosis again
across C6-7 and facet joints is redemonstrated.

As above there has been interval anterior plate fusion at C7-T1 with
stable alignment at this level with grade 1 anterolisthesis.

Posterior disc osteophyte complexes are associated with spinal canal
stenosis and mild cord compression at C2-3, C3-4, C4-5, with
multilevel severe degenerative foraminal stenosis related to facet
joint and uncinate hypertrophy.

Upper chest: There are multiple small thyroid nodules up to 1 cm.
There is asymmetric emphysema and calcified granulomas in the right
lung apex.

Other: Mild calcification both carotid bifurcations.
IMPRESSION: 1. Right frontal scalp hematoma with no acute intracranial CT
findings or depressed skull fractures.
2. Atrophy and additional chronic changes described above.
3. Cervical kypholevoscoliosis and degenerative changes with
osteopenia. No evidence of acute fracture.
4. Multilevel vertebral body and facet joint ankylosis of the
cervical spine with interval anterior plate fusion of C7-T1.
5. 3 levels with disc osteophyte complexes causing mild cord
compression. Multilevel severe degenerative foraminal stenosis.
6. Emphysema.

## 2021-05-10 IMAGING — CT CT HEAD W/O CM
3 of 4 series · 13 of 47 positions shown, 15 images · non-contrast
Comparison: Head CT dated [DATE], cervical spine CT dated
[DATE]

CLINICAL DATA: Head trauma with neck pain.



[Series 4: head wo · axial · 0.45mm/px · z∈[-126,-11]mm · 7 of 31 slices shown, 9 images]
[im 4/31  brain]
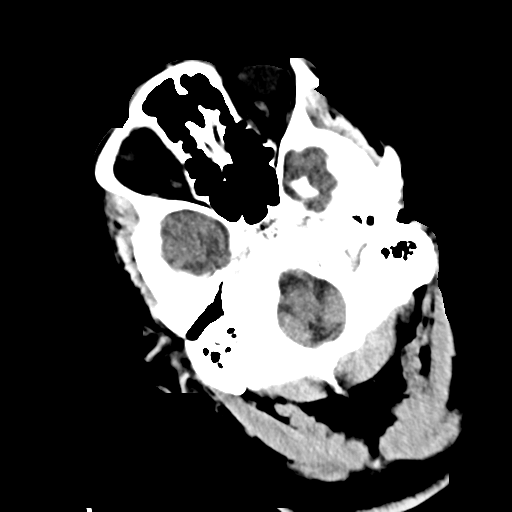
[im 4/31  bone]
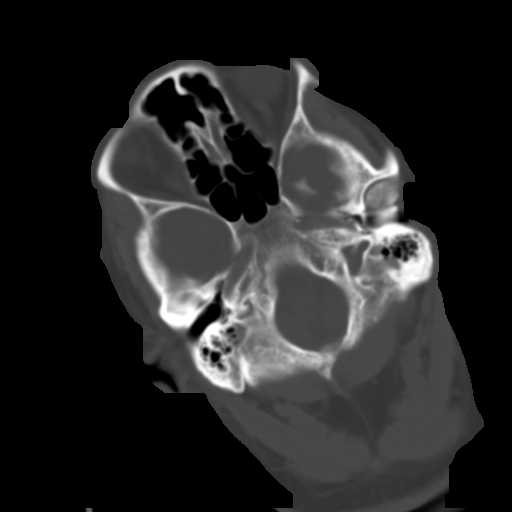
[im 8/31  brain]
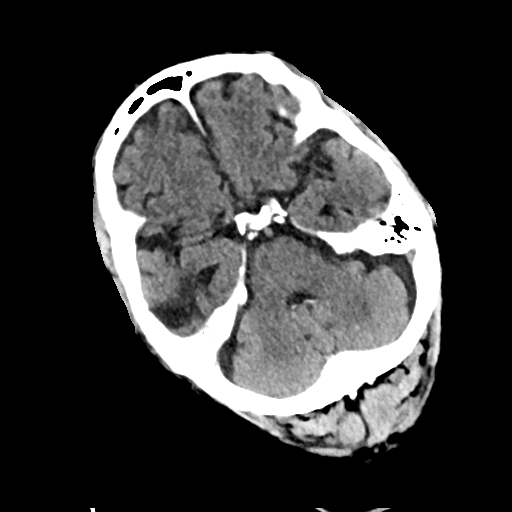
[im 12/31  brain]
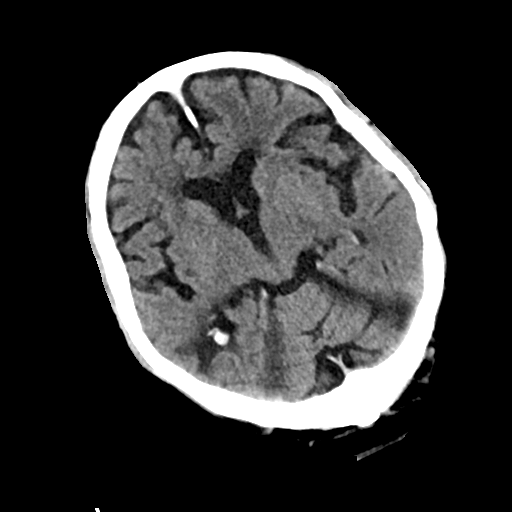
[im 16/31  brain]
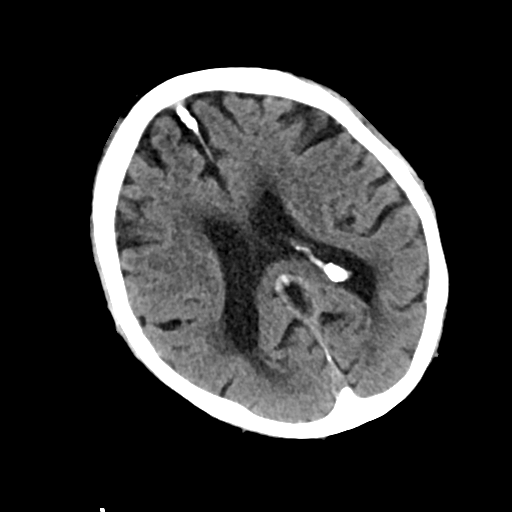
[im 19/31  brain]
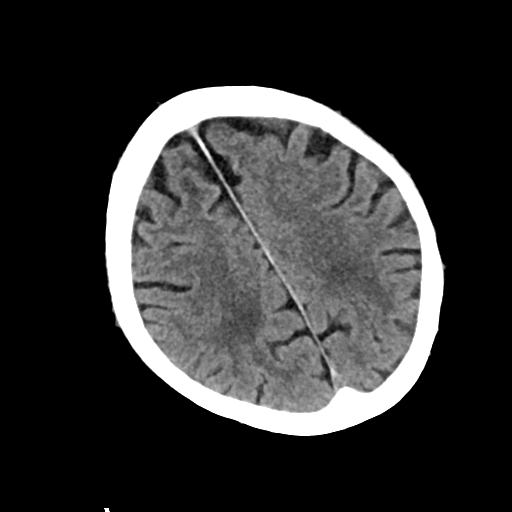
[im 19/31  bone]
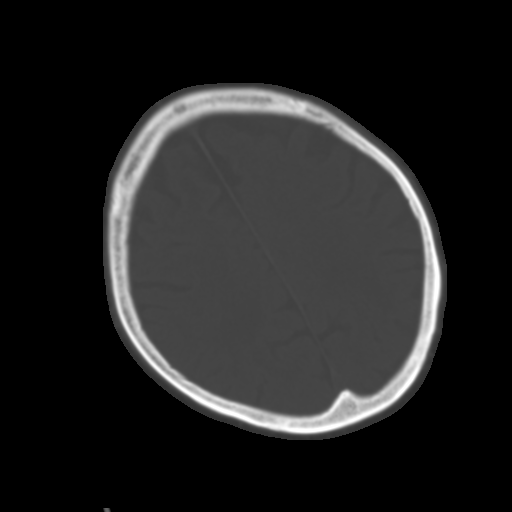
[im 23/31  brain]
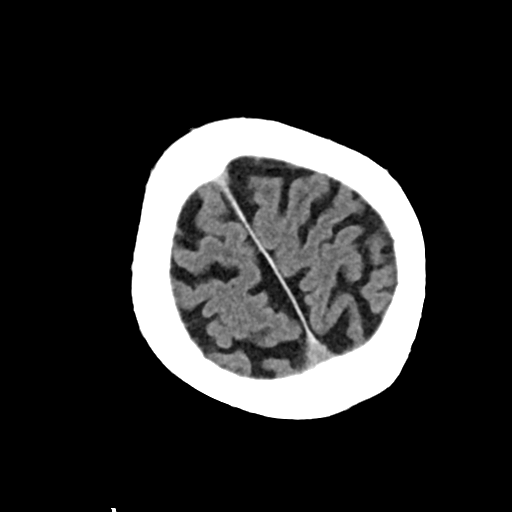
[im 27/31  brain]
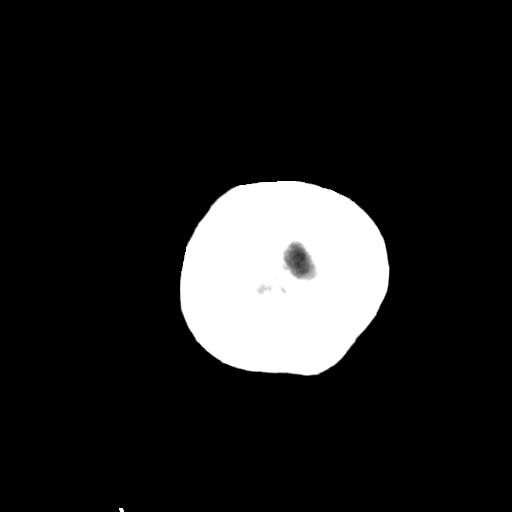

[Series 5: cor soft · coronal · 0.31mm/px · 3 of 79 slices shown]
[im 35/79  brain]
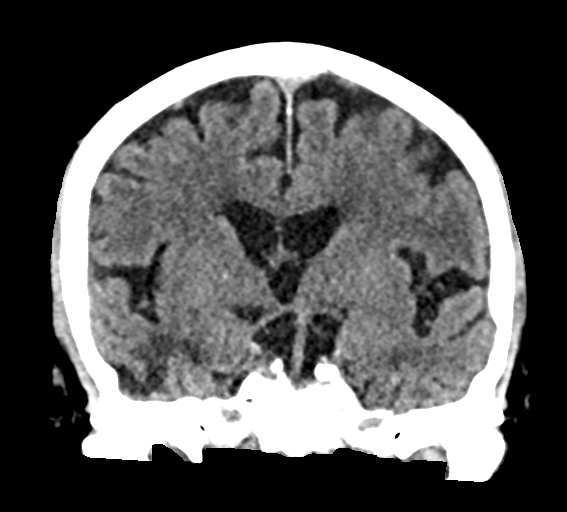
[im 44/79  brain]
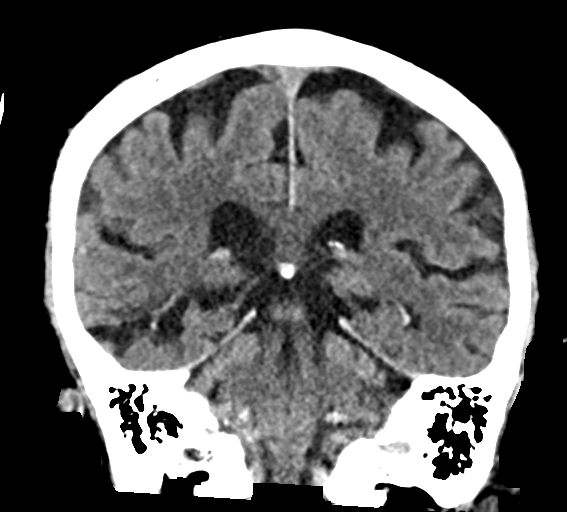
[im 52/79  brain]
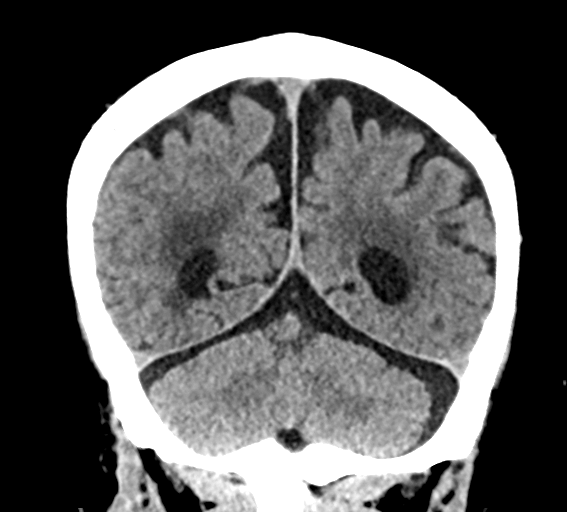

[Series 6: sag soft · sagittal · 0.33mm/px · 3 of 59 slices shown]
[im 20/59  brain]
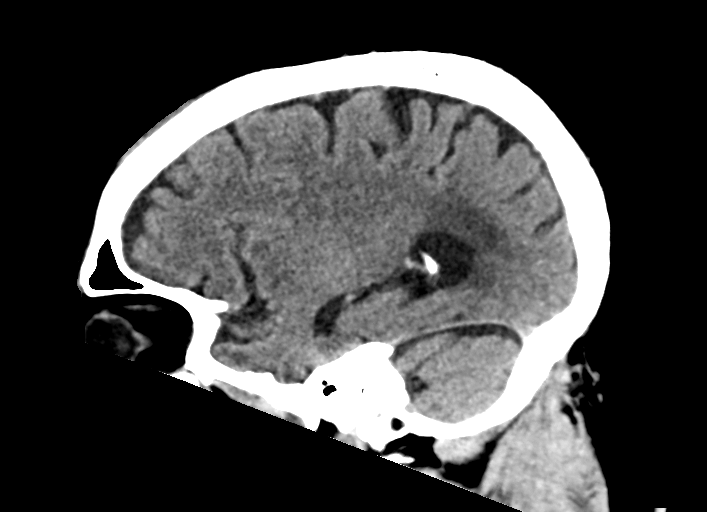
[im 30/59  brain]
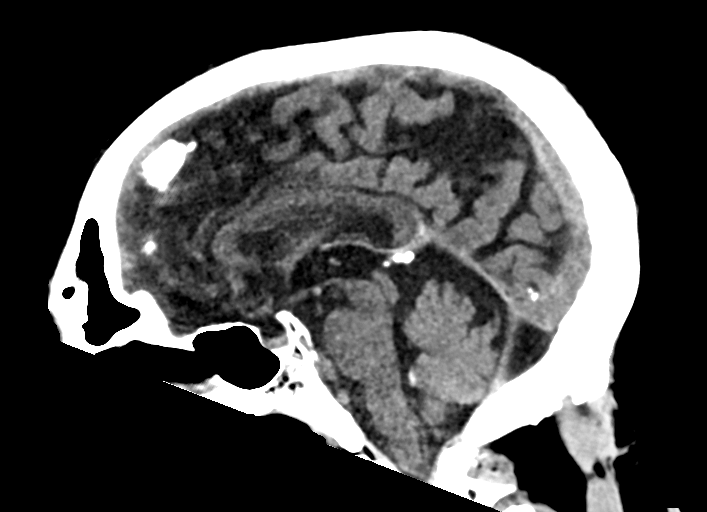
[im 39/59  brain]
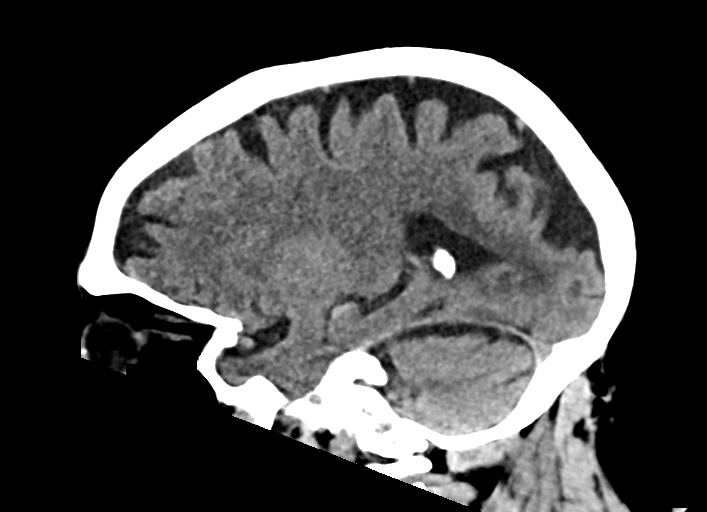

[13 of 47 positions shown; findings below may reference images not displayed]

FINDINGS: CT HEAD FINDINGS

Brain: There is moderately developed cerebral atrophy, small vessel
disease and mild atrophic ventriculomegaly with relatively mild
cerebellar atrophy. An old cortical infarct is again noted in the
posterior right temporal lobe.

There are few tiny chronic bilateral gangliocapsular lacunar
infarcts. No asymmetry is seen worrisome for acute infarct,
hemorrhage or mass. There is no midline shift. Basal cisterns are
patent. There are dystrophic calcifications in the frontal falx.

Vascular: There are calcifications in both distal vertebral arteries
and siphons but no hyperdense central vasculature.

Skull: There is a small right frontal scalp hematoma. The calvarium
and skull base are intact without focal bone lesions.

Sinuses/Orbits: Evidence of prior left lens replacement. Visualized
sinuses and mastoid air cells are clear.

Other: None.

CT CERVICAL SPINE FINDINGS

Alignment: There is a mild cervical kypholevoscoliosis, and again
noted 3 mm grade 1 degenerative anterolisthesis at C4-5 and C7-T1
with interval anterior fusion plating with an interbody disc space
apparatus at this level. There is no evidence of acute hardware
complication or loosening.

The posterior alignment is unchanged and otherwise unremarkable.
Narrowing and moderate osteophytosis of the anterior atlantodental
joint is again shown.

Skull base and vertebrae: There is chronic C7 spinous process tip
fracture with nonunion, unchanged. There is osteopenia with no
evidence of acute cervical fracture.

Soft tissues and spinal canal: No prevertebral fluid or swelling. No
visible canal hematoma.

Disc levels: Asymmetric advanced degenerative arthrosis and
bone-on-bone apposition are again noted of the left C1-2 lateral
mass articulations, with osteophytes.

Bony ankylosis seen as before across the C2-3 vertebral bodies and
facet joints and across the C3-4 vertebral bodies and right facet
joint.

There is chronic disc collapse and bidirectional osteophytes at C4-5
and C5-6 with anterior ankylosis at C5-6, and bony ankylosis again
across C6-7 and facet joints is redemonstrated.

As above there has been interval anterior plate fusion at C7-T1 with
stable alignment at this level with grade 1 anterolisthesis.

Posterior disc osteophyte complexes are associated with spinal canal
stenosis and mild cord compression at C2-3, C3-4, C4-5, with
multilevel severe degenerative foraminal stenosis related to facet
joint and uncinate hypertrophy.

Upper chest: There are multiple small thyroid nodules up to 1 cm.
There is asymmetric emphysema and calcified granulomas in the right
lung apex.

Other: Mild calcification both carotid bifurcations.
IMPRESSION: 1. Right frontal scalp hematoma with no acute intracranial CT
findings or depressed skull fractures.
2. Atrophy and additional chronic changes described above.
3. Cervical kypholevoscoliosis and degenerative changes with
osteopenia. No evidence of acute fracture.
4. Multilevel vertebral body and facet joint ankylosis of the
cervical spine with interval anterior plate fusion of C7-T1.
5. 3 levels with disc osteophyte complexes causing mild cord
compression. Multilevel severe degenerative foraminal stenosis.
6. Emphysema.

## 2021-05-10 IMAGING — DX DG CHEST 1V PORT
1 series · 1 of 1 positions shown · non-contrast
Comparison: [DATE]

CLINICAL DATA: Recent fall with chest pain, initial encounter

EXAM:
PORTABLE CHEST 1 VIEW

[chest ap]
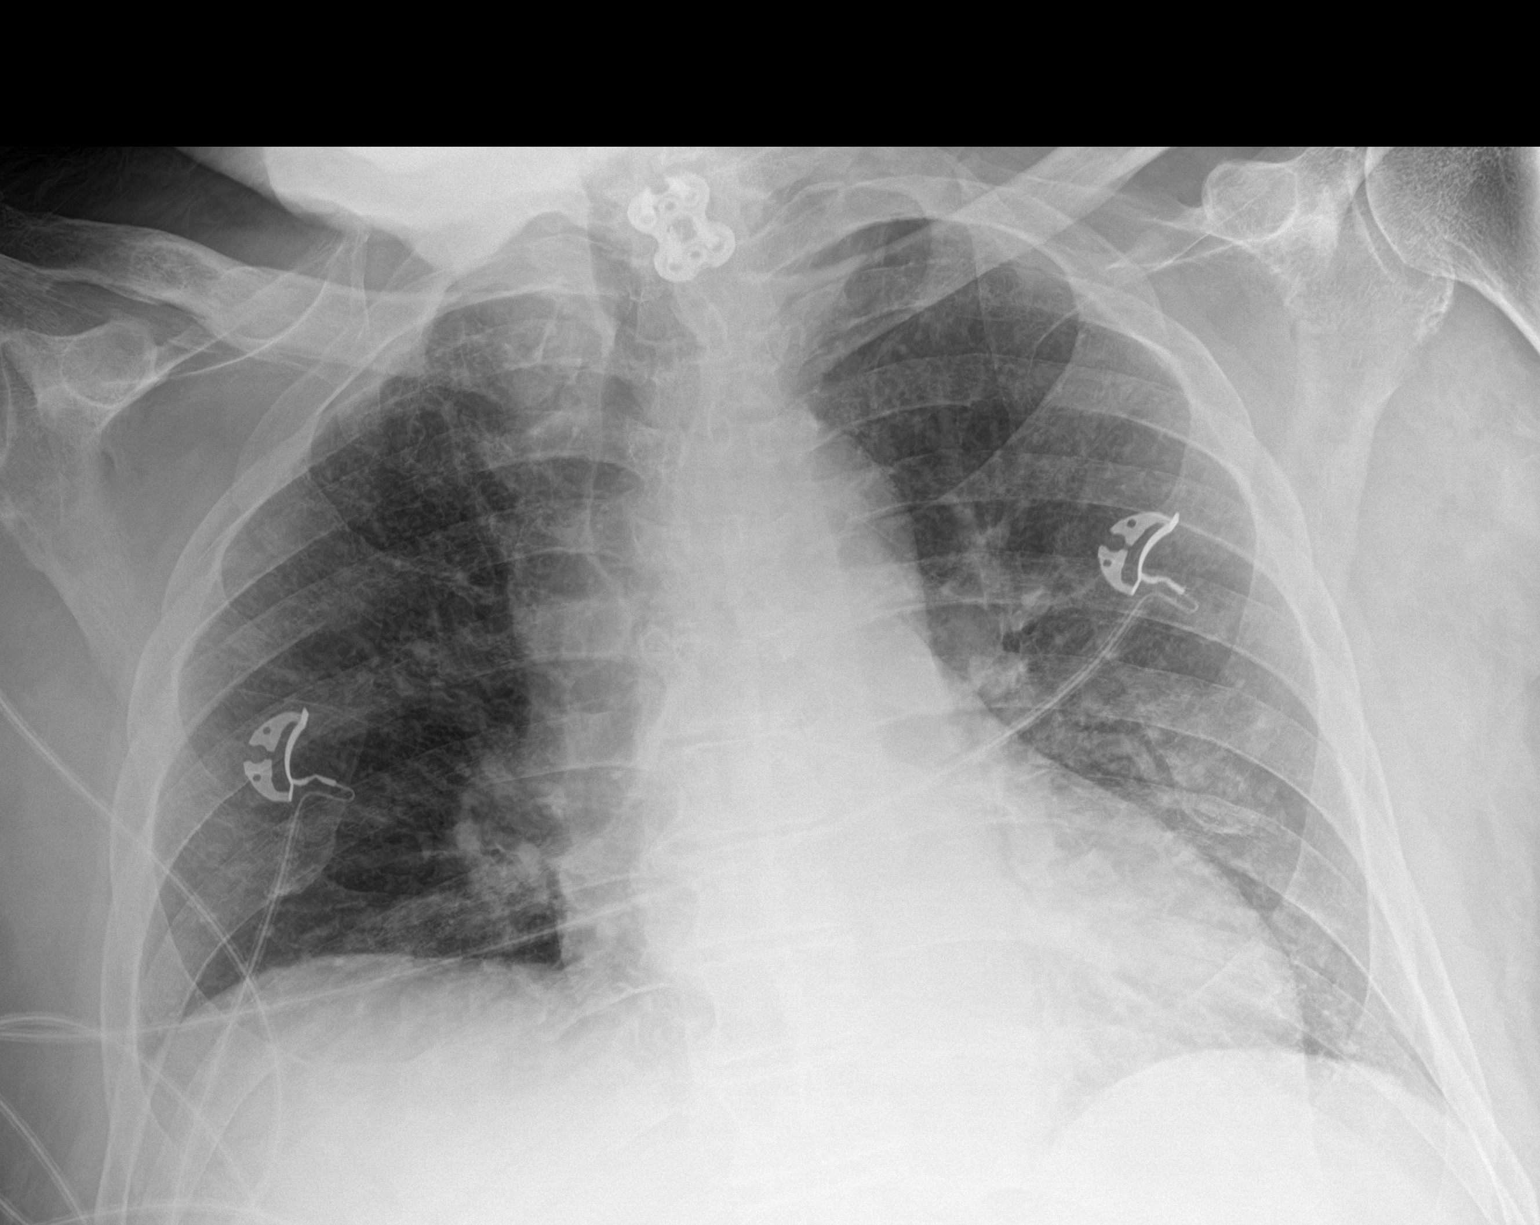

[1 of 1 positions shown; findings below may reference images not displayed]

FINDINGS: Cardiac shadow prominent but accentuated by the portable technique.
Lungs are well aerated bilaterally. Patchy airspace opacity is noted
in the mid and lower left lung. No bony abnormality is noted. Prior
surgical change in the cervical spine is noted.
IMPRESSION: New patchy airspace within the left lung.

## 2021-05-10 NOTE — ED Triage Notes (Signed)
Pt comes via [facility] from [facility] after unwitnessed fall, hit head on, thinners, no LOC

## 2021-05-10 NOTE — Discharge Instructions (Signed)
Return for any problem.  ?

## 2021-05-10 NOTE — ED Provider Notes (Signed)
[facility] EMERGENCY DEPARTMENT Provider Note   CSN: [id number] Arrival date & time: 05/10/21  2023     History  Chief Complaint  Patient presents with   Michael Jensen is a 86 y.o. male.  86 year old male with prior medical history as detailed below presents for evaluation.  Patient with unwitnessed fall at his facility.  Patient currently takes Plavix daily.  He did strike his head.  Patient without significant complaint of pain   Patient is apparently at baseline.  Patient without evidence for complaint of other injury or pain.  He does have a contusion to the right forehead  The history is provided by the patient, the EMS personnel and medical records.  Fall This is a new problem. The current episode started less than 1 hour ago. The problem occurs rarely. The problem has not changed since onset.Pertinent negatives include no chest pain and no abdominal pain. Nothing aggravates the symptoms. Nothing relieves the symptoms.      Home Medications Prior to Admission medications   Medication Sig Start Date End Date Taking? Authorizing Provider  acetaminophen (TYLENOL) 500 MG tablet Take 1,000 mg by mouth every 6 (six) hours as needed for mild pain or headache.    [provider]  albuterol (VENTOLIN HFA) 108 (90 Base) MCG/ACT inhaler Inhale 2 puffs into the lungs daily as needed for wheezing or shortness of breath.    [provider]  Amino Acids-Protein Hydrolys (FEEDING SUPPLEMENT, PRO-STAT 64,) LIQD Take 30 mLs by mouth every morning.    [provider]  APIXABAN Everlene Balls) VTE STARTER PACK (10MG  AND 5MG ) Take as directed on package: start with two-5mg  tablets twice daily for 7 days. On day 8, switch to one-5mg  tablet twice daily. 03/21/21   , DO  Ascorbic Acid (VITAMIN C) 1000 MG tablet Take 1,000 mg by mouth every morning.    [provider]  atorvastatin (LIPITOR) 40 MG tablet Take 40 mg by mouth  every evening.    [provider]  brimonidine (ALPHAGAN) 0.2 % ophthalmic solution Place 1 drop into both eyes 2 (two) times daily. 12/23/17   [provider]  cyclobenzaprine (FLEXERIL) 10 MG tablet Take 10 mg by mouth 3 (three) times daily.    [provider]  docusate sodium (COLACE) 100 MG capsule Take 100 mg by mouth 2 (two) times daily.    [provider]  dorzolamide (TRUSOPT) 2 % ophthalmic solution Place 1 drop into both eyes 2 (two) times daily. 10/22/17   [provider]  finasteride (PROSCAR) 5 MG tablet Take 5 mg by mouth every morning.    [provider]  Infant Care Products [facility]) OINT Apply 1 application topically See admin instructions. Apply topically to sacral area and buttocks every shift for barrier protection    [provider]  insulin aspart (NOVOLOG) 100 UNIT/ML injection 0-15 Units, Subcutaneous, 3 times daily with meals CBG < 70: Implement Hypoglycemia measures CBG 70 - 120: 0 units CBG 121 - 150: 2 units CBG 151 - 200: 3 units CBG 201 - 250: 5 units CBG 251 - 300: 8 units CBG 301 - 350: 11 units CBG 351 - 400: 15 units CBG > 400: call MD Patient taking differently: Inject 0-15 Units into the skin See admin instructions. Inject 0-15 units subcutaneously three times daily with meals per sliding scale:  CBG < 70: Implement Hypoglycemia measures CBG 70 - 120: 0 units CBG 121 - 150:  2 units CBG 151 - 200: 3 units CBG 201 - 250: 5 units CBG 251 - 300: 8 units CBG 301 - 350: 11 units CBG 351 - 400: 15 units CBG > 400: call MD 12/01/20   Maretta Bees, MD  latanoprost (XALATAN) 0.005 % ophthalmic solution Place 1 drop into the right eye at bedtime. 12/01/20   Ghimire, Werner Lean, MD  metoprolol tartrate (LOPRESSOR) 25 MG tablet Take 25 mg by mouth 2 (two) times daily.    [provider]  Multiple Vitamin (MULTIVITAMIN WITH MINERALS) TABS tablet Take 1 tablet by mouth every morning. Centrum Silver     [provider]  pantoprazole (PROTONIX) 40 MG tablet Take 40 mg by mouth daily before breakfast.    [provider]  polyethylene glycol (MIRALAX / GLYCOLAX) 17 g packet Take 17 g by mouth daily as needed (constipation).    [provider]  tamsulosin (FLOMAX) 0.4 MG CAPS capsule Take 1 capsule (0.4 mg total) by mouth in the morning and at bedtime. 12/01/20   Ghimire, Werner Lean, MD      Allergies    Patient has no known allergies.    Review of Systems   Review of Systems  Cardiovascular:  Negative for chest pain.  Gastrointestinal:  Negative for abdominal pain.  All other systems reviewed and are negative.  Physical Exam Updated Vital Signs BP 104/87 Comment: manual    Pulse 91    Temp 97.9 F (36.6 C)    Resp 20    Ht 5\' 8"  (1.727 m)    Wt 77.1 kg    SpO2 98%    BMI 25.85 kg/m  Physical Exam Vitals and nursing note reviewed.  Constitutional:      General: He is not in acute distress.    Appearance: Normal appearance. He is well-developed.  HENT:     Head: Normocephalic.     Comments: Contusion noted to right forehead Eyes:     Conjunctiva/sclera: Conjunctivae normal.     Pupils: Pupils are equal, round, and reactive to light.  Cardiovascular:     Rate and Rhythm: Normal rate and regular rhythm.     Heart sounds: Normal heart sounds.  Pulmonary:     Effort: Pulmonary effort is normal. No respiratory distress.     Breath sounds: Normal breath sounds.  Abdominal:     General: There is no distension.     Palpations: Abdomen is soft.     Tenderness: There is no abdominal tenderness.  Musculoskeletal:        General: No deformity. Normal range of motion.     Cervical back: Normal range of motion and neck supple.  Skin:    General: Skin is warm and dry.  Neurological:     General: No focal deficit present.     Mental Status: He is alert and oriented to person, place, and time.    ED Results / Procedures / Treatments   Labs (all labs ordered  are listed, but only abnormal results are displayed) Labs Reviewed - No data to display  EKG None  Radiology No results found.  Procedures Procedures    Medications Ordered in ED Medications - No data to display  ED Course/ Medical Decision Making/ A&P                           Medical Decision Making Amount and/or Complexity of Data Reviewed Labs: ordered. Radiology: ordered.  Medical Screen Complete  This patient presented to the ED with complaint of fall, head injury.  This complaint involves an extensive number of treatment options. The initial differential diagnosis includes, but is not limited to, traumatic injury related to fall, intracranial injury, metabolic abnormality, etc.  This presentation is: Acute, Self-Limited, Previously Undiagnosed, Uncertain Prognosis, Complicated, Systemic Symptoms, and Threat to Life/Bodily Function  Patient presents for evaluation after reported fall.  Patient without evidence of significant traumatic injury on evaluation  Imaging is without evidence of significant pathology.  Patient is improved after ED evaluation.  He desires DC home.  Co morbidities that complicated the patient's evaluation  Advanced age, anticoagulation   Additional history obtained:  Additional history obtained from EMS External records from outside sources obtained and reviewed including prior ED visits and prior Inpatient records.    Lab Tests:  I ordered and personally interpreted labs.  The pertinent results include: CBC, BMP   Imaging Studies ordered:  I ordered imaging studies including CT head, CT cervical spine, plain films of pelvis and chest I independently visualized and interpreted obtained imaging which showed no acute pathology I agree with the radiologist interpretation.   Cardiac Monitoring:  The patient was maintained on a cardiac monitor.  I personally viewed and interpreted the cardiac monitor which showed an  underlying rhythm of: NSR    Problem List / ED Course:  Fall, head injury   Reevaluation:  After the interventions noted above, I reevaluated the patient and found that they have: improved   Disposition:  After consideration of the diagnostic results and the patients response to treatment, I feel that the patent would benefit from close outpatient follow-up.          Final Clinical Impression(s) / ED Diagnoses Final diagnoses:  Injury of head, initial encounter    Rx / DC Orders ED Discharge Orders     None         Wynetta Fines, MD 05/10/21 2255

## 2021-05-10 NOTE — ED Notes (Signed)
Trauma Response Nurse Documentation   Michael Jensen is a 86 y.o. male arriving to [facility] ED via EMS  On Eliquis (apixaban) daily. Trauma was activated as a Level 2 by ED charge RN based on the following trauma criteria Elderly patients > 65 with head trauma on anti-coagulation (excluding ASA). Trauma team at the bedside on patient arrival. Patient cleared for CT by Dr. Rodena Medin EDP. Patient to CT with team. GCS 15.  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)    BPH (benign prostatic hyperplasia)    GERD (gastroesophageal reflux disease)    Glaucoma    HTN (hypertension)    Hyperlipidemia    OA (osteoarthritis)      Past Surgical History:  Procedure Laterality Date   ANTERIOR CERVICAL DECOMP/DISCECTOMY FUSION N/A 11/28/2020   Procedure: ANTERIOR CERVICAL DECOMPRESSION/DISCECTOMY FUSION  CERVICAL SEVEN(C7)-THORACIC ONE(T1);  Surgeon: Jadene Pierini, MD;  Location: [facility] OR;  Service: Neurosurgery;  Laterality: N/A;   HERNIA REPAIR         Initial Focused Assessment (If applicable, or please see trauma documentation): Hematoma right forehead  CT's Completed:   CT Head and CT C-Spine   Interventions:  IV start and trauma lab draw Portable chest and pelvis XRAYS CTs as above Condom cath  Plan for disposition:  Pending imaging results  Consults completed:  none at the time of this note.  Event Summary: Patient arrives via EMS from [facility], unwitnessed fall with unknown downtime. Found on floor, patient states this was not the case and he jumped from a 9 foot cabinet and used a parachute to get down. Patient alertx4, family reports that pt is more confused than normal. Hematoma to right forehead. Warm to the touch, foul smelling urine, diaper soaked. Changed bed linens, condom cath placed. Portable XRAYS completed, escorted to CT. Pending results at this time.  MTP Summary (If applicable):  NA  Bedside handoff with ED RN Shanda Bumps.    Bintou Lafata O Lizza Huffaker  Trauma Response RN  Please call TRN at [phone] for further assistance.

## 2021-05-11 DIAGNOSIS — R4182 Altered mental status, unspecified: Secondary | ICD-10-CM | POA: Diagnosis not present

## 2021-05-11 DIAGNOSIS — Z7401 Bed confinement status: Secondary | ICD-10-CM | POA: Diagnosis not present

## 2021-05-12 DIAGNOSIS — I10 Essential (primary) hypertension: Secondary | ICD-10-CM | POA: Diagnosis not present

## 2021-05-12 DIAGNOSIS — M6281 Muscle weakness (generalized): Secondary | ICD-10-CM | POA: Diagnosis not present

## 2021-05-12 DIAGNOSIS — W19XXXD Unspecified fall, subsequent encounter: Secondary | ICD-10-CM | POA: Diagnosis not present

## 2021-05-12 DIAGNOSIS — F32A Depression, unspecified: Secondary | ICD-10-CM | POA: Diagnosis not present

## 2021-05-12 DIAGNOSIS — M4800 Spinal stenosis, site unspecified: Secondary | ICD-10-CM | POA: Diagnosis not present

## 2021-05-20 DIAGNOSIS — G629 Polyneuropathy, unspecified: Secondary | ICD-10-CM | POA: Diagnosis not present

## 2021-05-20 DIAGNOSIS — F32A Depression, unspecified: Secondary | ICD-10-CM | POA: Diagnosis not present

## 2021-05-20 DIAGNOSIS — K5900 Constipation, unspecified: Secondary | ICD-10-CM | POA: Diagnosis not present

## 2021-05-20 DIAGNOSIS — M6281 Muscle weakness (generalized): Secondary | ICD-10-CM | POA: Diagnosis not present

## 2021-05-20 DIAGNOSIS — I10 Essential (primary) hypertension: Secondary | ICD-10-CM | POA: Diagnosis not present

## 2021-05-20 DIAGNOSIS — H548 Legal blindness, as defined in USA: Secondary | ICD-10-CM | POA: Diagnosis not present

## 2021-05-20 DIAGNOSIS — I82401 Acute embolism and thrombosis of unspecified deep veins of right lower extremity: Secondary | ICD-10-CM | POA: Diagnosis not present

## 2021-05-20 DIAGNOSIS — K219 Gastro-esophageal reflux disease without esophagitis: Secondary | ICD-10-CM | POA: Diagnosis not present

## 2021-05-24 DIAGNOSIS — E1165 Type 2 diabetes mellitus with hyperglycemia: Secondary | ICD-10-CM | POA: Diagnosis not present

## 2021-05-24 DIAGNOSIS — R339 Retention of urine, unspecified: Secondary | ICD-10-CM | POA: Diagnosis not present

## 2021-05-24 DIAGNOSIS — I10 Essential (primary) hypertension: Secondary | ICD-10-CM | POA: Diagnosis not present

## 2021-05-24 DIAGNOSIS — M4800 Spinal stenosis, site unspecified: Secondary | ICD-10-CM | POA: Diagnosis not present

## 2021-05-29 ENCOUNTER — Ambulatory Visit: Payer: Medicare HMO | Admitting: Podiatry

## 2021-06-04 DIAGNOSIS — F32A Depression, unspecified: Secondary | ICD-10-CM | POA: Diagnosis not present

## 2021-06-04 DIAGNOSIS — H548 Legal blindness, as defined in USA: Secondary | ICD-10-CM | POA: Diagnosis not present

## 2021-06-04 DIAGNOSIS — G629 Polyneuropathy, unspecified: Secondary | ICD-10-CM | POA: Diagnosis not present

## 2021-06-04 DIAGNOSIS — I10 Essential (primary) hypertension: Secondary | ICD-10-CM | POA: Diagnosis not present

## 2021-06-04 DIAGNOSIS — M6281 Muscle weakness (generalized): Secondary | ICD-10-CM | POA: Diagnosis not present

## 2021-06-06 ENCOUNTER — Other Ambulatory Visit: Payer: Self-pay

## 2021-06-06 ENCOUNTER — Encounter: Payer: Self-pay | Admitting: Podiatry

## 2021-06-06 ENCOUNTER — Ambulatory Visit (INDEPENDENT_AMBULATORY_CARE_PROVIDER_SITE_OTHER): Payer: Medicare Other | Admitting: Podiatry

## 2021-06-06 DIAGNOSIS — E1142 Type 2 diabetes mellitus with diabetic polyneuropathy: Secondary | ICD-10-CM

## 2021-06-06 DIAGNOSIS — K219 Gastro-esophageal reflux disease without esophagitis: Secondary | ICD-10-CM | POA: Diagnosis not present

## 2021-06-06 DIAGNOSIS — M79674 Pain in right toe(s): Secondary | ICD-10-CM | POA: Diagnosis not present

## 2021-06-06 DIAGNOSIS — B351 Tinea unguium: Secondary | ICD-10-CM

## 2021-06-06 DIAGNOSIS — R269 Unspecified abnormalities of gait and mobility: Secondary | ICD-10-CM | POA: Insufficient documentation

## 2021-06-06 DIAGNOSIS — M4800 Spinal stenosis, site unspecified: Secondary | ICD-10-CM | POA: Diagnosis not present

## 2021-06-06 DIAGNOSIS — F32A Depression, unspecified: Secondary | ICD-10-CM | POA: Diagnosis not present

## 2021-06-06 DIAGNOSIS — G629 Polyneuropathy, unspecified: Secondary | ICD-10-CM | POA: Diagnosis not present

## 2021-06-06 DIAGNOSIS — M79675 Pain in left toe(s): Secondary | ICD-10-CM | POA: Diagnosis not present

## 2021-06-06 DIAGNOSIS — M6281 Muscle weakness (generalized): Secondary | ICD-10-CM | POA: Diagnosis not present

## 2021-06-12 NOTE — Progress Notes (Signed)
?  Subjective:  ?Patient ID: Michael Jensen, male    DOB: 06/06/1935,  MRN: [id number] ? ?Michael Jensen presents to clinic today for at risk foot care with history of diabetic neuropathy and painful elongated mycotic toenails 1-5 bilaterally which are tender when wearing enclosed shoe gear. Pain is relieved with periodic professional debridement. ? ?Patient states blood glucose was 193 mg/dl today  ? ?New problem(s): None.  ? ?Patient states his heel ulcer has healed. He continues to wear the heel pillow. ? ?PCP is Michael Loader, FNP , and last visit was July 15, 2020. ? ?No Known Allergies ? ?Review of Systems: Negative except as noted in the HPI. ? ?Objective:  ?Michael Jensen is a pleasant 86 y.o. male in NAD. AAO X 3. ? ?Vascular Examination: ?CFT <3 seconds b/l LE. Palpable pedal pulses b/l LE. Pedal hair absent. No pain with calf compression b/l. Dependent edema noted b/l LE. No cyanosis or clubbing noted b/l LE. ? ?Dermatological Examination: ?Pedal skin is warm and supple b/l LE. No interdigital macerations noted b/l LE. Toenails 1-5 bilaterally elongated, discolored, dystrophic, thickened, and crumbly with subungual debris and tenderness to dorsal palpation. Right heel pressure ulcer resolved.  ? ?Musculoskeletal Examination: ?Noted disuse atrophy bilaterally. No pain, crepitus or joint limitation noted with ROM bilateral LE. Pes planus deformity noted bilateral LE. Utilizes wheelchair for mobility assistance. ? ?Neurological Examination: ?Protective sensation diminished with 10g monofilament b/l. ? ? ?  Latest Ref Rng & Units 11/25/2020  ?  8:56 AM  ?Hemoglobin A1C  ?Hemoglobin-A1c 4.8 - 5.6 % 6.3    ? ?Assessment/Plan: ?1. Pain due to onychomycosis of toenails of both feet   ?2. Diabetic peripheral neuropathy associated with type 2 diabetes mellitus ([place])   ?  ? ?-Examined patient. ?-Pressure ulcer right heel has resolved. Continue pressure precautions with heel pillow daily. ?-Toenails 1-5 b/l were debrided  in length and girth with sterile nail nippers and dremel without iatrogenic bleeding.  ?-Patient/POA to call should there be question/concern in the interim.  ? ?Return in about 3 months (around 09/06/2021). ? ?Marzetta Board, DPM  ?

## 2021-06-17 DIAGNOSIS — I10 Essential (primary) hypertension: Secondary | ICD-10-CM | POA: Diagnosis not present

## 2021-06-17 DIAGNOSIS — G629 Polyneuropathy, unspecified: Secondary | ICD-10-CM | POA: Diagnosis not present

## 2021-06-17 DIAGNOSIS — L89619 Pressure ulcer of right heel, unspecified stage: Secondary | ICD-10-CM | POA: Diagnosis not present

## 2021-06-17 DIAGNOSIS — M6281 Muscle weakness (generalized): Secondary | ICD-10-CM | POA: Diagnosis not present

## 2021-06-17 DIAGNOSIS — H548 Legal blindness, as defined in USA: Secondary | ICD-10-CM | POA: Diagnosis not present

## 2021-06-17 DIAGNOSIS — F32A Depression, unspecified: Secondary | ICD-10-CM | POA: Diagnosis not present

## 2021-06-17 DIAGNOSIS — K5900 Constipation, unspecified: Secondary | ICD-10-CM | POA: Diagnosis not present

## 2021-06-25 DIAGNOSIS — G629 Polyneuropathy, unspecified: Secondary | ICD-10-CM | POA: Diagnosis not present

## 2021-06-25 DIAGNOSIS — I10 Essential (primary) hypertension: Secondary | ICD-10-CM | POA: Diagnosis not present

## 2021-06-25 DIAGNOSIS — M6281 Muscle weakness (generalized): Secondary | ICD-10-CM | POA: Diagnosis not present

## 2021-06-25 DIAGNOSIS — F32A Depression, unspecified: Secondary | ICD-10-CM | POA: Diagnosis not present

## 2021-06-25 DIAGNOSIS — K219 Gastro-esophageal reflux disease without esophagitis: Secondary | ICD-10-CM | POA: Diagnosis not present

## 2021-06-25 DIAGNOSIS — H548 Legal blindness, as defined in USA: Secondary | ICD-10-CM | POA: Diagnosis not present

## 2021-06-28 DIAGNOSIS — F32A Depression, unspecified: Secondary | ICD-10-CM | POA: Diagnosis not present

## 2021-06-28 DIAGNOSIS — M4800 Spinal stenosis, site unspecified: Secondary | ICD-10-CM | POA: Diagnosis not present

## 2021-06-28 DIAGNOSIS — M6281 Muscle weakness (generalized): Secondary | ICD-10-CM | POA: Diagnosis not present

## 2021-06-28 DIAGNOSIS — I10 Essential (primary) hypertension: Secondary | ICD-10-CM | POA: Diagnosis not present

## 2021-07-11 DIAGNOSIS — I10 Essential (primary) hypertension: Secondary | ICD-10-CM | POA: Diagnosis not present

## 2021-07-11 DIAGNOSIS — M4800 Spinal stenosis, site unspecified: Secondary | ICD-10-CM | POA: Diagnosis not present

## 2021-07-11 DIAGNOSIS — F32A Depression, unspecified: Secondary | ICD-10-CM | POA: Diagnosis not present

## 2021-07-11 DIAGNOSIS — G629 Polyneuropathy, unspecified: Secondary | ICD-10-CM | POA: Diagnosis not present

## 2021-07-28 DIAGNOSIS — M4800 Spinal stenosis, site unspecified: Secondary | ICD-10-CM | POA: Diagnosis not present

## 2021-07-28 DIAGNOSIS — K219 Gastro-esophageal reflux disease without esophagitis: Secondary | ICD-10-CM | POA: Diagnosis not present

## 2021-07-28 DIAGNOSIS — I10 Essential (primary) hypertension: Secondary | ICD-10-CM | POA: Diagnosis not present

## 2021-07-28 DIAGNOSIS — F59 Unspecified behavioral syndromes associated with physiological disturbances and physical factors: Secondary | ICD-10-CM | POA: Diagnosis not present

## 2021-08-07 DIAGNOSIS — F59 Unspecified behavioral syndromes associated with physiological disturbances and physical factors: Secondary | ICD-10-CM | POA: Diagnosis not present

## 2021-08-07 DIAGNOSIS — F32A Depression, unspecified: Secondary | ICD-10-CM | POA: Diagnosis not present

## 2021-08-07 DIAGNOSIS — R339 Retention of urine, unspecified: Secondary | ICD-10-CM | POA: Diagnosis not present

## 2021-08-07 DIAGNOSIS — I10 Essential (primary) hypertension: Secondary | ICD-10-CM | POA: Diagnosis not present

## 2021-08-07 DIAGNOSIS — K219 Gastro-esophageal reflux disease without esophagitis: Secondary | ICD-10-CM | POA: Diagnosis not present

## 2021-08-12 DIAGNOSIS — N400 Enlarged prostate without lower urinary tract symptoms: Secondary | ICD-10-CM | POA: Diagnosis not present

## 2021-08-12 DIAGNOSIS — M6281 Muscle weakness (generalized): Secondary | ICD-10-CM | POA: Diagnosis not present

## 2021-08-12 DIAGNOSIS — R339 Retention of urine, unspecified: Secondary | ICD-10-CM | POA: Diagnosis not present

## 2021-08-12 DIAGNOSIS — K219 Gastro-esophageal reflux disease without esophagitis: Secondary | ICD-10-CM | POA: Diagnosis not present

## 2021-08-12 DIAGNOSIS — F32A Depression, unspecified: Secondary | ICD-10-CM | POA: Diagnosis not present

## 2021-08-12 DIAGNOSIS — I10 Essential (primary) hypertension: Secondary | ICD-10-CM | POA: Diagnosis not present

## 2021-08-12 DIAGNOSIS — F59 Unspecified behavioral syndromes associated with physiological disturbances and physical factors: Secondary | ICD-10-CM | POA: Diagnosis not present

## 2021-08-12 DIAGNOSIS — G629 Polyneuropathy, unspecified: Secondary | ICD-10-CM | POA: Diagnosis not present

## 2021-08-12 DIAGNOSIS — H548 Legal blindness, as defined in USA: Secondary | ICD-10-CM | POA: Diagnosis not present

## 2021-09-11 ENCOUNTER — Ambulatory Visit (INDEPENDENT_AMBULATORY_CARE_PROVIDER_SITE_OTHER): Payer: Medicare Other | Admitting: Podiatry

## 2021-09-11 ENCOUNTER — Encounter: Payer: Self-pay | Admitting: Podiatry

## 2021-09-11 DIAGNOSIS — B351 Tinea unguium: Secondary | ICD-10-CM | POA: Diagnosis not present

## 2021-09-11 DIAGNOSIS — E1142 Type 2 diabetes mellitus with diabetic polyneuropathy: Secondary | ICD-10-CM

## 2021-09-11 DIAGNOSIS — L600 Ingrowing nail: Secondary | ICD-10-CM | POA: Diagnosis not present

## 2021-09-11 DIAGNOSIS — M79675 Pain in left toe(s): Secondary | ICD-10-CM

## 2021-09-11 DIAGNOSIS — M79674 Pain in right toe(s): Secondary | ICD-10-CM | POA: Diagnosis not present

## 2021-09-11 DIAGNOSIS — L89629 Pressure ulcer of left heel, unspecified stage: Secondary | ICD-10-CM

## 2021-09-19 NOTE — Progress Notes (Signed)
  Subjective:  Patient ID: Michael Jensen, male    DOB: 09-06-1935,  MRN: [id number]  Michael Jensen presents to clinic today for at risk foot care with history of diabetic neuropathy and painful thick toenails that are difficult to trim. Aggravating factors include wearing enclosed shoe gear. Pain is relieved with periodic professional debridement.  Patient is a resident of [facility] and [address]. He states his blood glucose was 455 mg/dl this morning. He is on insulin.  His last A1c iwas in the 8% range.  New problem(s): None.   PCP is Soundra Pilon, FNP , and last visit was May 08, 2021.  No Known Allergies  Review of Systems: Negative except as noted in the HPI.  Objective:Michael Jensen is a pleasant 86 y.o. male in NAD. AAO X 3.  Vascular Examination: CFT <3 seconds b/l LE. Palpable pedal pulses b/l LE. Pedal hair absent. No pain with calf compression b/l. Dependent edema noted b/l LE. No cyanosis or clubbing noted b/l LE.  Dermatological Examination: Pedal skin is warm and supple b/l LE. No interdigital macerations noted b/l LE. Toenails 1-5 bilaterally elongated, discolored, dystrophic, thickened, and crumbly with subungual debris and tenderness to dorsal palpation.  Incurvated nailplate medial border left hallux.  Nail border hypertrophy absent. There is tenderness to palpation. Sign(s) of infection: no clinical signs of infection noted on examination today.  Healing blood blister dorsal aspect left 4th toe. Healed decubitus ulcer right heel. POP posterolateral aspect LLE with no signs of infection.  Musculoskeletal Examination: Noted disuse atrophy bilaterally. No pain, crepitus or joint limitation noted with ROM bilateral LE. Pes planus deformity noted bilateral LE. Utilizes wheelchair for mobility assistance.  Neurological Examination: Protective sensation diminished with 10g monofilament b/l.     Latest Ref Rng & Units 11/25/2020    8:56 AM  Hemoglobin A1C   Hemoglobin-A1c 4.8 - 5.6 % 6.3    Assessment/Plan: 1. Pain due to onychomycosis of toenails of both feet   2. Ingrown toenail without infection   3. Pressure injury of skin of left heel, unspecified injury stage   4. Diabetic peripheral neuropathy associated with type 2 diabetes mellitus (HCC)      -Patient was evaluated and treated. All patient's and/or POA's questions/concerns answered on today's visit. -Continue pressure precautions. -Patient to continue wearing heel protector on LLE until pain resolves.. -Mycotic toenails 1-5 bilaterally were debrided in length and girth with sterile nail nippers and dremel without incident. -Offending nail border debrided and curretaged medial border left hallux utilizing sterile nail nipper and currette. Border(s) cleansed with alcohol and TAO applied. Order written for facility to apply triple antibiotic ointment  to left great toe once daily for 7 days. Call office if there are any concerns. -Patient/POA to call should there be question/concern in the interim.   Return in about 3 months (around 12/12/2021).  [name], DPM

## 2021-12-12 ENCOUNTER — Ambulatory Visit (INDEPENDENT_AMBULATORY_CARE_PROVIDER_SITE_OTHER): Payer: Self-pay | Admitting: Podiatry

## 2021-12-12 DIAGNOSIS — Z91199 Patient's noncompliance with other medical treatment and regimen due to unspecified reason: Secondary | ICD-10-CM

## 2021-12-15 NOTE — Progress Notes (Signed)
No charge. When wife came in for her appointment, she stated Michael Jensen passed away in 2022/10/16.
